# Patient Record
Sex: Female | Born: 1980 | Race: Black or African American | Hispanic: No | Marital: Married | Smoking: Former smoker
Health system: Southern US, Community
[De-identification: ages and names within clinical notes are randomized; demographics above are authoritative.]

## PROBLEM LIST (undated history)

## (undated) ENCOUNTER — Inpatient Hospital Stay (HOSPITAL_COMMUNITY): Payer: Self-pay

## (undated) DIAGNOSIS — R51 Headache: Secondary | ICD-10-CM

## (undated) DIAGNOSIS — Z9851 Tubal ligation status: Secondary | ICD-10-CM

## (undated) DIAGNOSIS — C801 Malignant (primary) neoplasm, unspecified: Secondary | ICD-10-CM

## (undated) DIAGNOSIS — Z8759 Personal history of other complications of pregnancy, childbirth and the puerperium: Secondary | ICD-10-CM

## (undated) DIAGNOSIS — B999 Unspecified infectious disease: Secondary | ICD-10-CM

## (undated) DIAGNOSIS — B009 Herpesviral infection, unspecified: Secondary | ICD-10-CM

## (undated) DIAGNOSIS — R87619 Unspecified abnormal cytological findings in specimens from cervix uteri: Secondary | ICD-10-CM

## (undated) DIAGNOSIS — J45909 Unspecified asthma, uncomplicated: Secondary | ICD-10-CM

## (undated) HISTORY — DX: Tubal ligation status: Z98.51

## (undated) HISTORY — PX: DILATION AND CURETTAGE OF UTERUS: SHX78

## (undated) HISTORY — DX: Unspecified abnormal cytological findings in specimens from cervix uteri: R87.619

## (undated) HISTORY — PX: DILATATION & CURETTAGE/HYSTEROSCOPY WITH TRUECLEAR: SHX6353

---

## 2004-10-01 ENCOUNTER — Emergency Department (HOSPITAL_COMMUNITY): Admission: EM | Admit: 2004-10-01 | Discharge: 2004-10-01 | Payer: Self-pay | Admitting: Emergency Medicine

## 2004-10-31 ENCOUNTER — Emergency Department (HOSPITAL_COMMUNITY): Admission: EM | Admit: 2004-10-31 | Discharge: 2004-10-31 | Payer: Self-pay | Admitting: Emergency Medicine

## 2004-12-17 ENCOUNTER — Emergency Department (HOSPITAL_COMMUNITY): Admission: EM | Admit: 2004-12-17 | Discharge: 2004-12-17 | Payer: Self-pay | Admitting: Emergency Medicine

## 2005-01-09 ENCOUNTER — Emergency Department (HOSPITAL_COMMUNITY): Admission: EM | Admit: 2005-01-09 | Discharge: 2005-01-09 | Payer: Self-pay | Admitting: Physician Assistant

## 2005-05-08 ENCOUNTER — Emergency Department (HOSPITAL_COMMUNITY): Admission: EM | Admit: 2005-05-08 | Discharge: 2005-05-08 | Payer: Self-pay | Admitting: Emergency Medicine

## 2005-06-03 ENCOUNTER — Ambulatory Visit: Payer: Self-pay | Admitting: Obstetrics and Gynecology

## 2005-06-03 ENCOUNTER — Inpatient Hospital Stay (HOSPITAL_COMMUNITY): Admission: AD | Admit: 2005-06-03 | Discharge: 2005-06-04 | Payer: Self-pay | Admitting: *Deleted

## 2005-06-11 ENCOUNTER — Ambulatory Visit (HOSPITAL_COMMUNITY): Admission: RE | Admit: 2005-06-11 | Discharge: 2005-06-11 | Payer: Self-pay | Admitting: *Deleted

## 2005-07-10 ENCOUNTER — Ambulatory Visit: Payer: Self-pay | Admitting: Family Medicine

## 2005-07-31 ENCOUNTER — Ambulatory Visit: Payer: Self-pay | Admitting: Family Medicine

## 2005-08-17 ENCOUNTER — Inpatient Hospital Stay (HOSPITAL_COMMUNITY): Admission: AD | Admit: 2005-08-17 | Discharge: 2005-08-17 | Payer: Self-pay | Admitting: *Deleted

## 2005-08-17 ENCOUNTER — Ambulatory Visit: Payer: Self-pay | Admitting: Family Medicine

## 2005-09-01 HISTORY — PX: GYNECOLOGIC CRYOSURGERY: SHX857

## 2005-09-15 ENCOUNTER — Observation Stay (HOSPITAL_COMMUNITY): Admission: AD | Admit: 2005-09-15 | Discharge: 2005-09-16 | Payer: Self-pay | Admitting: Obstetrics & Gynecology

## 2005-09-15 ENCOUNTER — Ambulatory Visit: Payer: Self-pay | Admitting: Obstetrics & Gynecology

## 2005-09-18 ENCOUNTER — Ambulatory Visit: Payer: Self-pay | Admitting: Family Medicine

## 2005-09-25 ENCOUNTER — Ambulatory Visit: Payer: Self-pay | Admitting: Family Medicine

## 2005-10-01 ENCOUNTER — Ambulatory Visit: Payer: Self-pay | Admitting: Obstetrics & Gynecology

## 2005-10-08 ENCOUNTER — Ambulatory Visit: Payer: Self-pay | Admitting: Certified Nurse Midwife

## 2005-10-08 ENCOUNTER — Inpatient Hospital Stay (HOSPITAL_COMMUNITY): Admission: AD | Admit: 2005-10-08 | Discharge: 2005-10-11 | Payer: Self-pay | Admitting: Obstetrics and Gynecology

## 2006-02-18 ENCOUNTER — Ambulatory Visit: Payer: Self-pay | Admitting: Obstetrics & Gynecology

## 2006-06-01 ENCOUNTER — Emergency Department (HOSPITAL_COMMUNITY): Admission: EM | Admit: 2006-06-01 | Discharge: 2006-06-02 | Payer: Self-pay | Admitting: Emergency Medicine

## 2006-06-29 ENCOUNTER — Inpatient Hospital Stay (HOSPITAL_COMMUNITY): Admission: AD | Admit: 2006-06-29 | Discharge: 2006-06-29 | Payer: Self-pay | Admitting: Gynecology

## 2006-08-15 ENCOUNTER — Emergency Department (HOSPITAL_COMMUNITY): Admission: EM | Admit: 2006-08-15 | Discharge: 2006-08-16 | Payer: Self-pay | Admitting: Emergency Medicine

## 2006-09-20 ENCOUNTER — Inpatient Hospital Stay (HOSPITAL_COMMUNITY): Admission: AD | Admit: 2006-09-20 | Discharge: 2006-09-20 | Payer: Self-pay | Admitting: Obstetrics & Gynecology

## 2006-09-22 ENCOUNTER — Ambulatory Visit: Payer: Self-pay | Admitting: *Deleted

## 2006-09-22 ENCOUNTER — Inpatient Hospital Stay (HOSPITAL_COMMUNITY): Admission: AD | Admit: 2006-09-22 | Discharge: 2006-09-23 | Payer: Self-pay | Admitting: Family Medicine

## 2006-09-24 ENCOUNTER — Ambulatory Visit (HOSPITAL_COMMUNITY): Admission: RE | Admit: 2006-09-24 | Discharge: 2006-09-24 | Payer: Self-pay | Admitting: Family Medicine

## 2006-09-30 ENCOUNTER — Ambulatory Visit: Payer: Self-pay | Admitting: *Deleted

## 2006-10-07 ENCOUNTER — Ambulatory Visit: Payer: Self-pay | Admitting: Obstetrics and Gynecology

## 2006-10-14 ENCOUNTER — Ambulatory Visit: Payer: Self-pay | Admitting: Obstetrics & Gynecology

## 2006-10-21 ENCOUNTER — Ambulatory Visit: Payer: Self-pay | Admitting: *Deleted

## 2006-10-21 ENCOUNTER — Encounter (INDEPENDENT_AMBULATORY_CARE_PROVIDER_SITE_OTHER): Payer: Self-pay | Admitting: *Deleted

## 2006-10-21 ENCOUNTER — Other Ambulatory Visit: Admission: RE | Admit: 2006-10-21 | Discharge: 2006-10-21 | Payer: Self-pay | Admitting: *Deleted

## 2006-11-04 ENCOUNTER — Ambulatory Visit: Payer: Self-pay | Admitting: Obstetrics & Gynecology

## 2006-11-11 ENCOUNTER — Ambulatory Visit: Payer: Self-pay | Admitting: Obstetrics & Gynecology

## 2006-11-18 ENCOUNTER — Ambulatory Visit: Payer: Self-pay | Admitting: *Deleted

## 2006-11-25 ENCOUNTER — Ambulatory Visit: Payer: Self-pay | Admitting: Obstetrics and Gynecology

## 2006-11-30 ENCOUNTER — Inpatient Hospital Stay (HOSPITAL_COMMUNITY): Admission: AD | Admit: 2006-11-30 | Discharge: 2006-11-30 | Payer: Self-pay | Admitting: Obstetrics & Gynecology

## 2006-11-30 ENCOUNTER — Ambulatory Visit: Payer: Self-pay | Admitting: *Deleted

## 2006-12-02 ENCOUNTER — Ambulatory Visit: Payer: Self-pay | Admitting: Obstetrics & Gynecology

## 2006-12-05 ENCOUNTER — Ambulatory Visit: Payer: Self-pay | Admitting: Obstetrics and Gynecology

## 2006-12-05 ENCOUNTER — Inpatient Hospital Stay (HOSPITAL_COMMUNITY): Admission: AD | Admit: 2006-12-05 | Discharge: 2006-12-05 | Payer: Self-pay | Admitting: Obstetrics and Gynecology

## 2006-12-09 ENCOUNTER — Ambulatory Visit: Payer: Self-pay | Admitting: Obstetrics & Gynecology

## 2006-12-16 ENCOUNTER — Ambulatory Visit: Payer: Self-pay | Admitting: Obstetrics & Gynecology

## 2006-12-23 ENCOUNTER — Ambulatory Visit: Payer: Self-pay | Admitting: *Deleted

## 2006-12-24 ENCOUNTER — Ambulatory Visit (HOSPITAL_COMMUNITY): Admission: RE | Admit: 2006-12-24 | Discharge: 2006-12-24 | Payer: Self-pay | Admitting: Obstetrics & Gynecology

## 2006-12-26 ENCOUNTER — Ambulatory Visit: Payer: Self-pay | Admitting: *Deleted

## 2006-12-26 ENCOUNTER — Inpatient Hospital Stay (HOSPITAL_COMMUNITY): Admission: AD | Admit: 2006-12-26 | Discharge: 2006-12-26 | Payer: Self-pay | Admitting: Family Medicine

## 2006-12-30 ENCOUNTER — Ambulatory Visit: Payer: Self-pay | Admitting: Obstetrics & Gynecology

## 2007-01-06 ENCOUNTER — Ambulatory Visit: Payer: Self-pay | Admitting: Obstetrics & Gynecology

## 2007-01-13 ENCOUNTER — Ambulatory Visit: Payer: Self-pay | Admitting: Obstetrics & Gynecology

## 2007-01-15 ENCOUNTER — Ambulatory Visit: Payer: Self-pay | Admitting: *Deleted

## 2007-01-15 ENCOUNTER — Inpatient Hospital Stay (HOSPITAL_COMMUNITY): Admission: AD | Admit: 2007-01-15 | Discharge: 2007-01-15 | Payer: Self-pay | Admitting: Obstetrics & Gynecology

## 2007-01-20 ENCOUNTER — Ambulatory Visit: Payer: Self-pay | Admitting: *Deleted

## 2007-01-27 ENCOUNTER — Ambulatory Visit: Payer: Self-pay | Admitting: Obstetrics & Gynecology

## 2007-01-29 ENCOUNTER — Ambulatory Visit (HOSPITAL_COMMUNITY): Admission: RE | Admit: 2007-01-29 | Discharge: 2007-01-29 | Payer: Self-pay | Admitting: Obstetrics and Gynecology

## 2007-02-01 ENCOUNTER — Ambulatory Visit: Payer: Self-pay | Admitting: *Deleted

## 2007-02-01 ENCOUNTER — Inpatient Hospital Stay (HOSPITAL_COMMUNITY): Admission: RE | Admit: 2007-02-01 | Discharge: 2007-02-03 | Payer: Self-pay | Admitting: Gynecology

## 2007-04-19 ENCOUNTER — Emergency Department (HOSPITAL_COMMUNITY): Admission: EM | Admit: 2007-04-19 | Discharge: 2007-04-19 | Payer: Self-pay | Admitting: Emergency Medicine

## 2007-06-05 ENCOUNTER — Inpatient Hospital Stay (HOSPITAL_COMMUNITY): Admission: AD | Admit: 2007-06-05 | Discharge: 2007-06-05 | Payer: Self-pay | Admitting: Obstetrics & Gynecology

## 2007-06-29 ENCOUNTER — Inpatient Hospital Stay (HOSPITAL_COMMUNITY): Admission: AD | Admit: 2007-06-29 | Discharge: 2007-06-29 | Payer: Self-pay | Admitting: Obstetrics & Gynecology

## 2007-07-12 ENCOUNTER — Inpatient Hospital Stay (HOSPITAL_COMMUNITY): Admission: AD | Admit: 2007-07-12 | Discharge: 2007-07-12 | Payer: Self-pay | Admitting: Obstetrics and Gynecology

## 2007-07-29 ENCOUNTER — Emergency Department (HOSPITAL_COMMUNITY): Admission: EM | Admit: 2007-07-29 | Discharge: 2007-07-30 | Payer: Self-pay | Admitting: Emergency Medicine

## 2007-09-10 ENCOUNTER — Inpatient Hospital Stay (HOSPITAL_COMMUNITY): Admission: AD | Admit: 2007-09-10 | Discharge: 2007-09-10 | Payer: Self-pay | Admitting: Gynecology

## 2007-11-11 ENCOUNTER — Inpatient Hospital Stay (HOSPITAL_COMMUNITY): Admission: AD | Admit: 2007-11-11 | Discharge: 2007-11-11 | Payer: Self-pay | Admitting: Obstetrics & Gynecology

## 2007-11-19 ENCOUNTER — Emergency Department (HOSPITAL_COMMUNITY): Admission: EM | Admit: 2007-11-19 | Discharge: 2007-11-19 | Payer: Self-pay | Admitting: Family Medicine

## 2007-12-02 ENCOUNTER — Emergency Department (HOSPITAL_COMMUNITY): Admission: EM | Admit: 2007-12-02 | Discharge: 2007-12-02 | Payer: Self-pay | Admitting: Family Medicine

## 2007-12-31 ENCOUNTER — Emergency Department (HOSPITAL_COMMUNITY): Admission: EM | Admit: 2007-12-31 | Discharge: 2008-01-01 | Payer: Self-pay | Admitting: Emergency Medicine

## 2008-03-09 ENCOUNTER — Emergency Department (HOSPITAL_COMMUNITY): Admission: EM | Admit: 2008-03-09 | Discharge: 2008-03-10 | Payer: Self-pay | Admitting: Emergency Medicine

## 2008-05-25 IMAGING — US US TRANSVAGINAL NON-OB
1 series · 14 of 25 positions shown · non-contrast
Comparison: 11/11/2007

CLINICAL DATA: Pelvic pain

ULTRASOUND PELVIS COMPLETE - MODIFY,TRANSVAGINAL ULTRASOUND OF
PELVIS

[Series 1: unknown · 0.32mm/px · 14 of 47 slices shown]
[im 1/47]
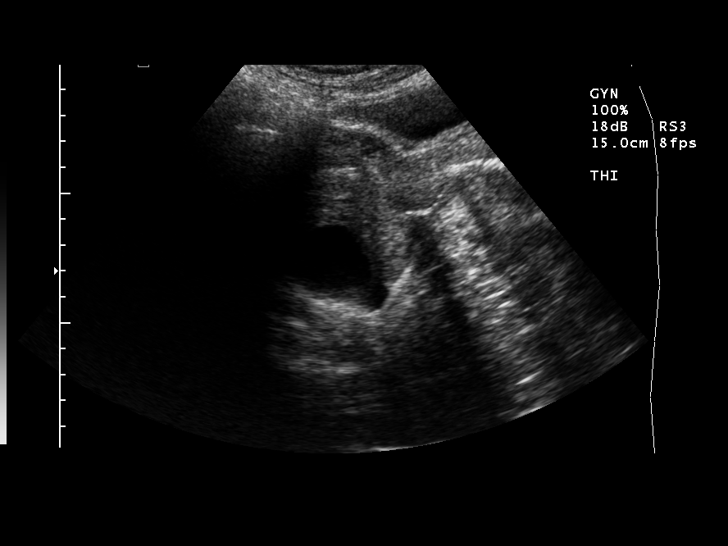
[im 4/47]
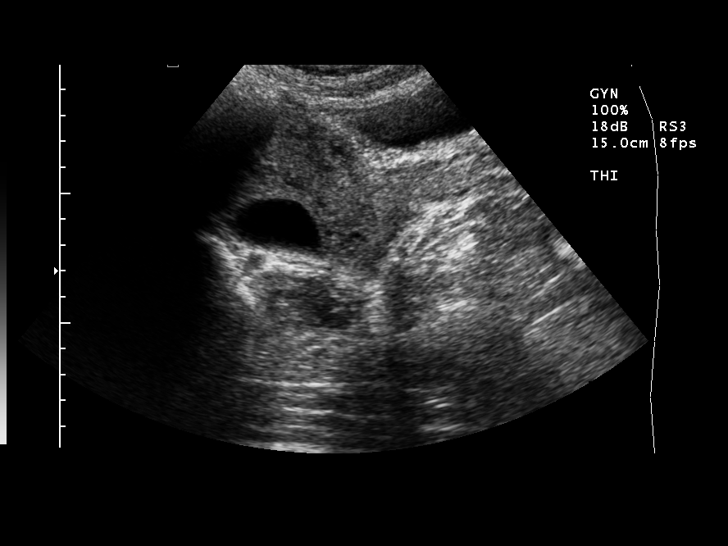
[im 8/47]
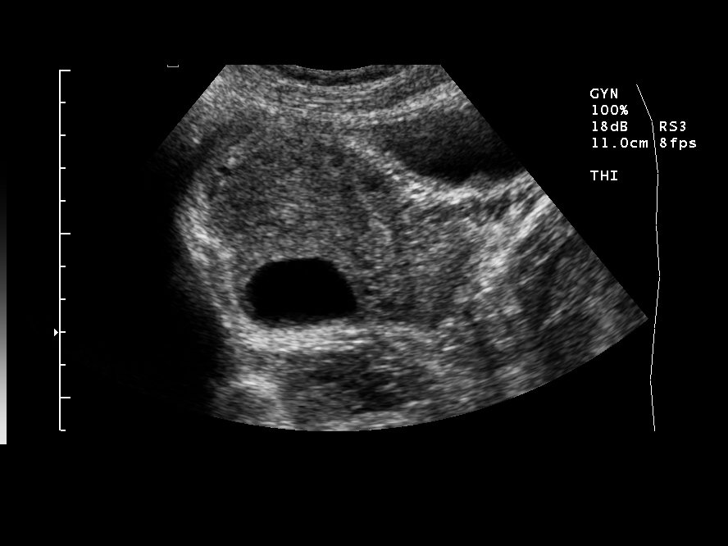
[im 12/47]
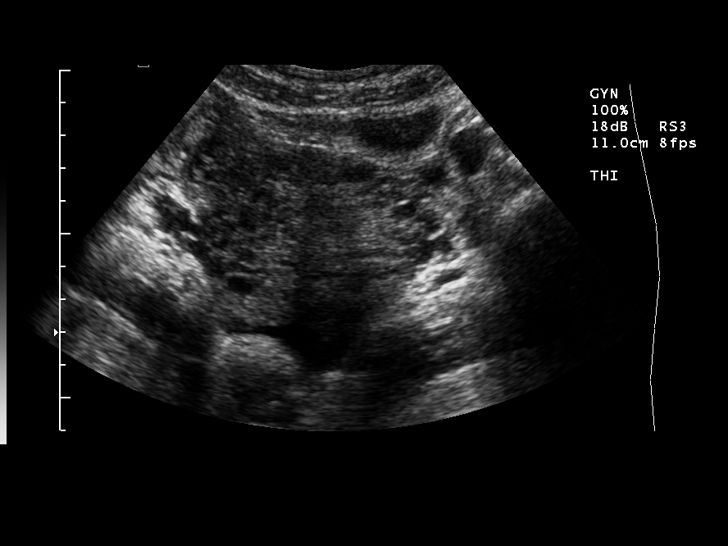
[im 16/47]
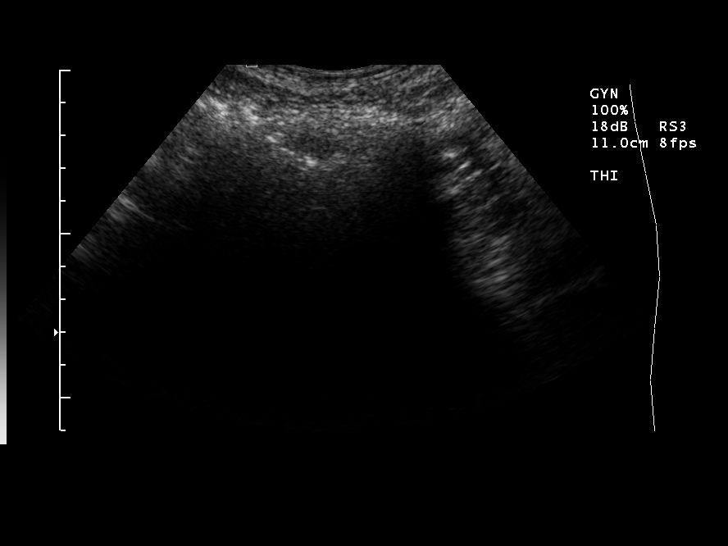
[im 18/47]
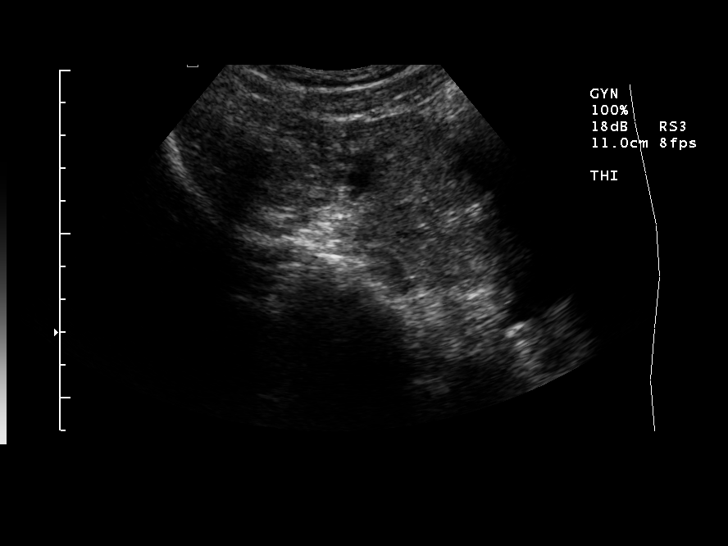
[im 22/47]
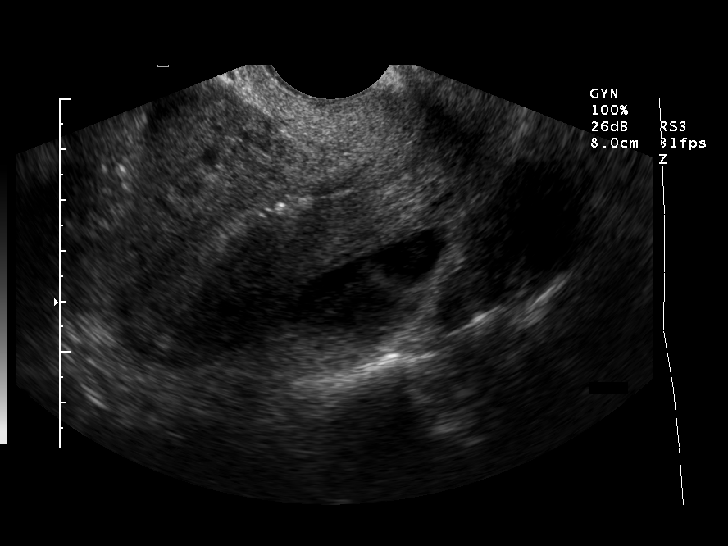
[im 25/47]
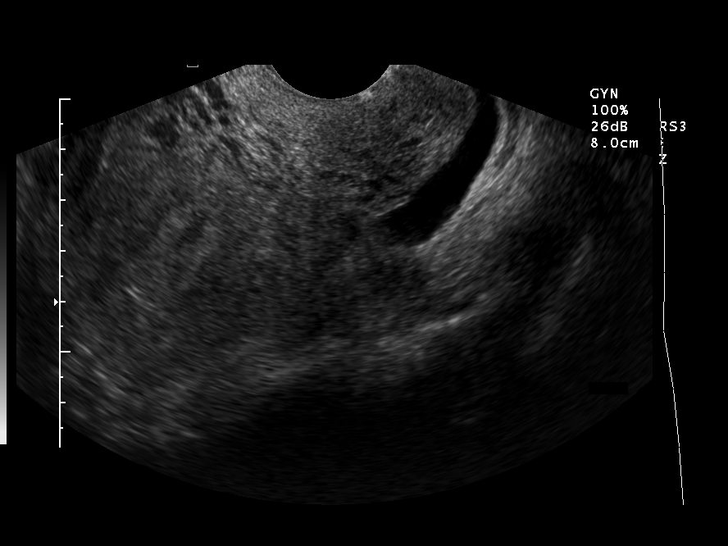
[im 29/47]
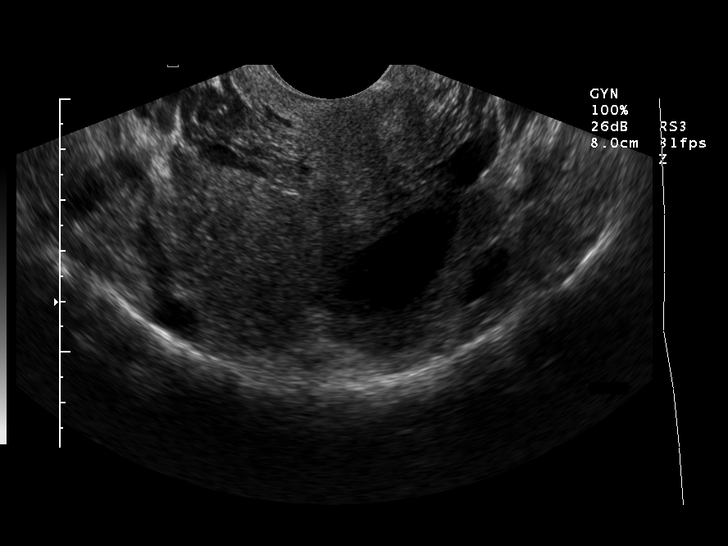
[im 31/47]
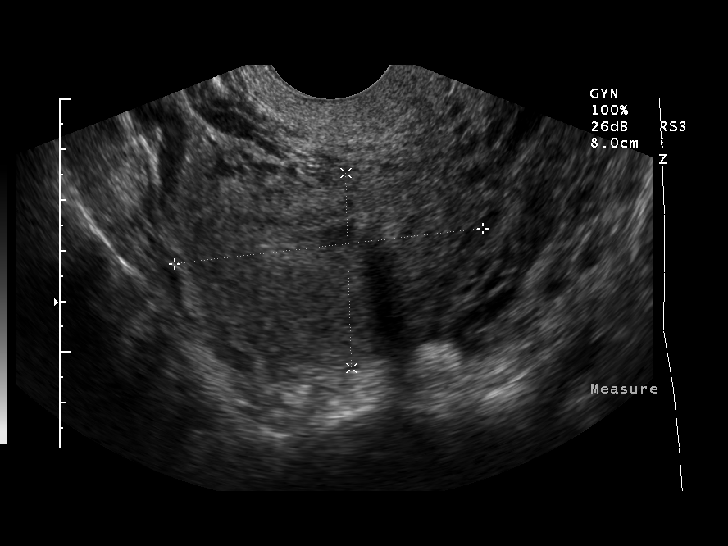
[im 35/47]
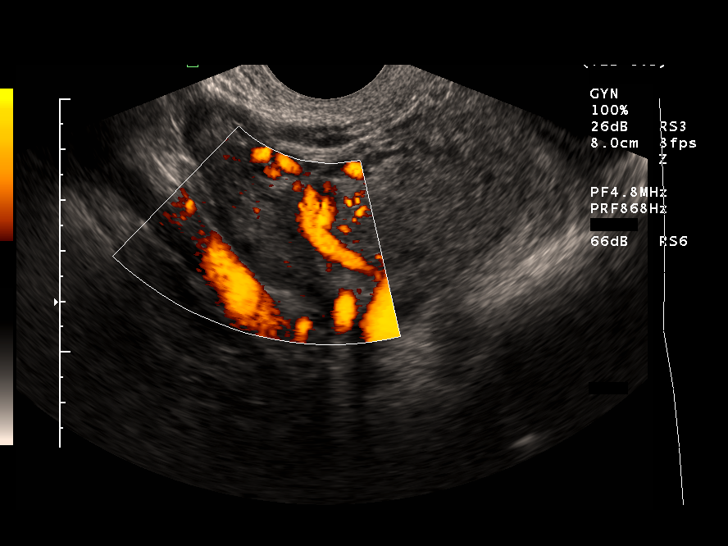
[im 39/47]
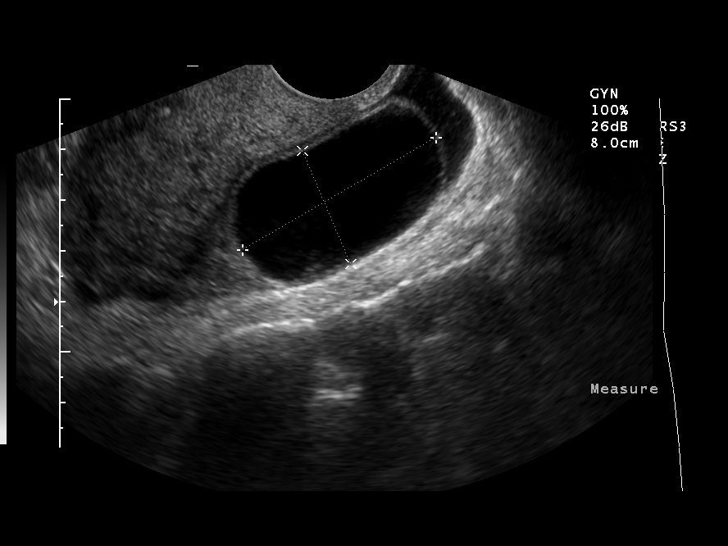
[im 43/47]
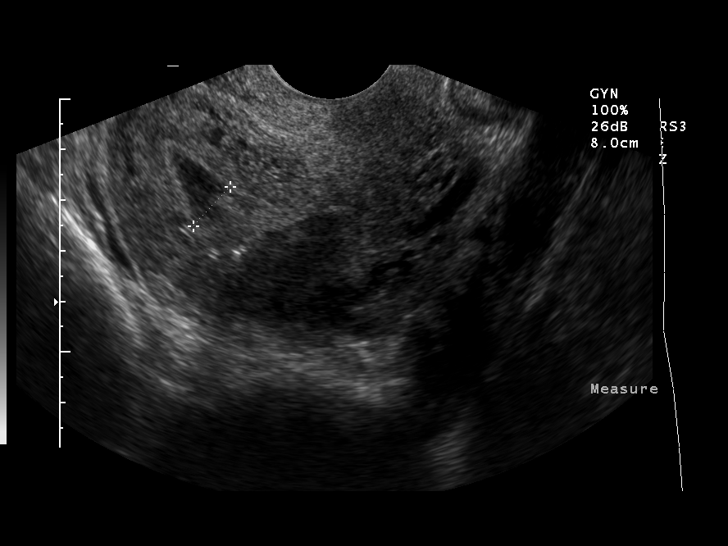
[im 47/47]
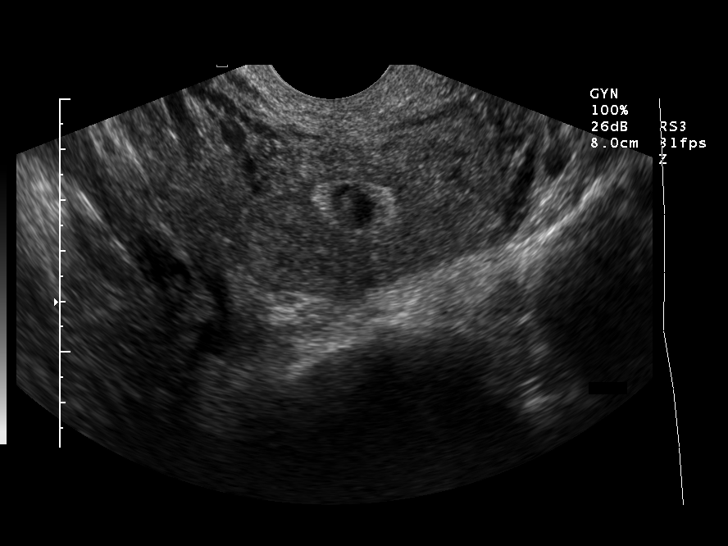

[14 of 25 positions shown; findings below may reference images not displayed]

FINDINGS: The uterus is a 0.8 x 3.8 x 6.1 cm.  The endometrial
stripe is 11 mm in thickness.  An IUD device is present within the
endometrial canal.  A small amount of fluid is present in the
endometrial canal. Irregularity of the endometrium is also noted
and underlying endometrial pathology is not excluded.

The right ovary is within normal limits.  There is a 4.4 x 2.4 x
3.0 cyst within the left ovary.  Doppler examination does
demonstrate flow in both ovaries.
IMPRESSION: The endometrium is abnormal.  Endometrial pathology such as polyp
or hyperplasia is not excluded.  Sonohysterogram may be helpful.

IUD device is positioned in the endometrium

The large right ovarian cyst previously noted has resolved.  Now,
there is a 4.4 cm left ovarian cyst.  Follow-up ultrasound in 6
weeks is recommended to ensure resolution.

## 2008-09-19 ENCOUNTER — Emergency Department (HOSPITAL_COMMUNITY): Admission: EM | Admit: 2008-09-19 | Discharge: 2008-09-19 | Payer: Self-pay | Admitting: Family Medicine

## 2008-11-14 ENCOUNTER — Emergency Department (HOSPITAL_COMMUNITY): Admission: EM | Admit: 2008-11-14 | Discharge: 2008-11-14 | Payer: Self-pay | Admitting: Family Medicine

## 2008-12-20 ENCOUNTER — Inpatient Hospital Stay (HOSPITAL_COMMUNITY): Admission: AD | Admit: 2008-12-20 | Discharge: 2008-12-20 | Payer: Self-pay | Admitting: Family Medicine

## 2009-03-25 ENCOUNTER — Emergency Department (HOSPITAL_COMMUNITY): Admission: EM | Admit: 2009-03-25 | Discharge: 2009-03-25 | Payer: Self-pay | Admitting: Emergency Medicine

## 2009-03-27 ENCOUNTER — Inpatient Hospital Stay (HOSPITAL_COMMUNITY): Admission: AD | Admit: 2009-03-27 | Discharge: 2009-03-27 | Payer: Self-pay | Admitting: Obstetrics & Gynecology

## 2009-04-04 ENCOUNTER — Inpatient Hospital Stay (HOSPITAL_COMMUNITY): Admission: RE | Admit: 2009-04-04 | Discharge: 2009-04-04 | Payer: Self-pay | Admitting: Obstetrics & Gynecology

## 2009-07-22 ENCOUNTER — Inpatient Hospital Stay (HOSPITAL_COMMUNITY): Admission: AD | Admit: 2009-07-22 | Discharge: 2009-07-22 | Payer: Self-pay | Admitting: Obstetrics and Gynecology

## 2009-08-10 ENCOUNTER — Ambulatory Visit: Payer: Self-pay | Admitting: Family Medicine

## 2009-08-10 ENCOUNTER — Inpatient Hospital Stay (HOSPITAL_COMMUNITY): Admission: AD | Admit: 2009-08-10 | Discharge: 2009-08-10 | Payer: Self-pay | Admitting: Obstetrics & Gynecology

## 2009-10-17 ENCOUNTER — Inpatient Hospital Stay (HOSPITAL_COMMUNITY): Admission: AD | Admit: 2009-10-17 | Discharge: 2009-10-17 | Payer: Self-pay | Admitting: Obstetrics & Gynecology

## 2009-10-18 ENCOUNTER — Inpatient Hospital Stay (HOSPITAL_COMMUNITY): Admission: AD | Admit: 2009-10-18 | Discharge: 2009-10-18 | Payer: Self-pay | Admitting: Obstetrics & Gynecology

## 2009-10-24 ENCOUNTER — Encounter: Payer: Self-pay | Admitting: Advanced Practice Midwife

## 2009-10-24 ENCOUNTER — Ambulatory Visit: Payer: Self-pay | Admitting: Obstetrics and Gynecology

## 2009-10-31 ENCOUNTER — Ambulatory Visit (HOSPITAL_COMMUNITY): Admission: RE | Admit: 2009-10-31 | Discharge: 2009-10-31 | Payer: Self-pay | Admitting: Obstetrics and Gynecology

## 2009-10-31 ENCOUNTER — Ambulatory Visit: Payer: Self-pay | Admitting: Obstetrics and Gynecology

## 2009-11-01 ENCOUNTER — Inpatient Hospital Stay (HOSPITAL_COMMUNITY): Admission: AD | Admit: 2009-11-01 | Discharge: 2009-11-02 | Payer: Self-pay | Admitting: Obstetrics & Gynecology

## 2009-11-01 ENCOUNTER — Ambulatory Visit: Payer: Self-pay | Admitting: Advanced Practice Midwife

## 2009-11-07 ENCOUNTER — Ambulatory Visit: Payer: Self-pay | Admitting: Obstetrics & Gynecology

## 2009-11-10 ENCOUNTER — Ambulatory Visit: Payer: Self-pay | Admitting: Advanced Practice Midwife

## 2009-11-10 ENCOUNTER — Inpatient Hospital Stay (HOSPITAL_COMMUNITY): Admission: AD | Admit: 2009-11-10 | Discharge: 2009-11-10 | Payer: Self-pay | Admitting: Obstetrics & Gynecology

## 2009-11-14 ENCOUNTER — Ambulatory Visit: Payer: Self-pay | Admitting: Obstetrics and Gynecology

## 2009-11-19 ENCOUNTER — Ambulatory Visit: Payer: Self-pay | Admitting: Obstetrics & Gynecology

## 2009-11-21 ENCOUNTER — Ambulatory Visit: Payer: Self-pay | Admitting: Obstetrics and Gynecology

## 2009-11-23 ENCOUNTER — Inpatient Hospital Stay (HOSPITAL_COMMUNITY): Admission: AD | Admit: 2009-11-23 | Discharge: 2009-11-26 | Payer: Self-pay | Admitting: Obstetrics and Gynecology

## 2009-11-23 ENCOUNTER — Ambulatory Visit: Payer: Self-pay | Admitting: Advanced Practice Midwife

## 2010-04-03 ENCOUNTER — Inpatient Hospital Stay (HOSPITAL_COMMUNITY): Admission: AD | Admit: 2010-04-03 | Discharge: 2010-04-03 | Payer: Self-pay | Admitting: Obstetrics and Gynecology

## 2010-04-03 ENCOUNTER — Ambulatory Visit: Payer: Self-pay | Admitting: Obstetrics and Gynecology

## 2010-11-15 LAB — URINALYSIS, ROUTINE W REFLEX MICROSCOPIC
Bilirubin Urine: NEGATIVE
Ketones, ur: NEGATIVE mg/dL
Nitrite: NEGATIVE
Protein, ur: NEGATIVE mg/dL
Urobilinogen, UA: 2 mg/dL — ABNORMAL HIGH (ref 0.0–1.0)
pH: 6 (ref 5.0–8.0)

## 2010-11-15 LAB — CBC
MCH: 32 pg (ref 26.0–34.0)
MCHC: 34.5 g/dL (ref 30.0–36.0)
MCV: 92.8 fL (ref 78.0–100.0)
Platelets: 184 10*3/uL (ref 150–400)
RBC: 4.09 MIL/uL (ref 3.87–5.11)
RDW: 14.2 % (ref 11.5–15.5)

## 2010-11-15 LAB — GC/CHLAMYDIA PROBE AMP, GENITAL: Chlamydia, DNA Probe: NEGATIVE

## 2010-11-15 LAB — WET PREP, GENITAL

## 2010-11-20 LAB — RAPID URINE DRUG SCREEN, HOSP PERFORMED
Barbiturates: NOT DETECTED
Benzodiazepines: NOT DETECTED
Cocaine: NOT DETECTED

## 2010-11-20 LAB — HEPATITIS B SURFACE ANTIGEN: Hepatitis B Surface Ag: NEGATIVE

## 2010-11-20 LAB — RUBELLA SCREEN: Rubella: 275.5 IU/mL — ABNORMAL HIGH

## 2010-11-20 LAB — URINE MICROSCOPIC-ADD ON

## 2010-11-20 LAB — URINALYSIS, ROUTINE W REFLEX MICROSCOPIC
Glucose, UA: NEGATIVE mg/dL
Ketones, ur: NEGATIVE mg/dL
Leukocytes, UA: NEGATIVE
pH: 7 (ref 5.0–8.0)

## 2010-11-20 LAB — DIFFERENTIAL
Basophils Absolute: 0 10*3/uL (ref 0.0–0.1)
Lymphocytes Relative: 20 % (ref 12–46)
Lymphs Abs: 1.8 10*3/uL (ref 0.7–4.0)
Neutro Abs: 6.5 10*3/uL (ref 1.7–7.7)

## 2010-11-20 LAB — STREP B DNA PROBE: Strep Group B Ag: NEGATIVE

## 2010-11-20 LAB — CBC
Hemoglobin: 8.8 g/dL — ABNORMAL LOW (ref 12.0–15.0)
Platelets: 227 10*3/uL (ref 150–400)
RDW: 13.6 % (ref 11.5–15.5)
WBC: 9.2 10*3/uL (ref 4.0–10.5)

## 2010-11-20 LAB — GC/CHLAMYDIA PROBE AMP, GENITAL
Chlamydia, DNA Probe: NEGATIVE
GC Probe Amp, Genital: NEGATIVE

## 2010-11-20 LAB — POCT URINALYSIS DIP (DEVICE)
Bilirubin Urine: NEGATIVE
Glucose, UA: NEGATIVE mg/dL
Ketones, ur: NEGATIVE mg/dL
Protein, ur: NEGATIVE mg/dL

## 2010-11-20 LAB — URINE CULTURE

## 2010-11-20 LAB — WET PREP, GENITAL: Yeast Wet Prep HPF POC: NONE SEEN

## 2010-11-20 LAB — RPR: RPR Ser Ql: NONREACTIVE

## 2010-11-24 LAB — WET PREP, GENITAL: Trich, Wet Prep: NONE SEEN

## 2010-11-24 LAB — POCT URINALYSIS DIP (DEVICE)
Glucose, UA: NEGATIVE mg/dL
Nitrite: NEGATIVE
Protein, ur: 30 mg/dL — AB
Specific Gravity, Urine: 1.015 (ref 1.005–1.030)
Urobilinogen, UA: 1 mg/dL (ref 0.0–1.0)
Urobilinogen, UA: 4 mg/dL — ABNORMAL HIGH (ref 0.0–1.0)
pH: 7 (ref 5.0–8.0)

## 2010-11-24 LAB — CBC
HCT: 26.3 % — ABNORMAL LOW (ref 36.0–46.0)
HCT: 28.2 % — ABNORMAL LOW (ref 36.0–46.0)
Hemoglobin: 8.7 g/dL — ABNORMAL LOW (ref 12.0–15.0)
Hemoglobin: 9.6 g/dL — ABNORMAL LOW (ref 12.0–15.0)
MCV: 87.1 fL (ref 78.0–100.0)
RBC: 3.24 MIL/uL — ABNORMAL LOW (ref 3.87–5.11)
RDW: 16.6 % — ABNORMAL HIGH (ref 11.5–15.5)
WBC: 8 10*3/uL (ref 4.0–10.5)

## 2010-11-24 LAB — RPR: RPR Ser Ql: NONREACTIVE

## 2010-12-03 LAB — RAPID URINE DRUG SCREEN, HOSP PERFORMED
Amphetamines: NOT DETECTED
Benzodiazepines: NOT DETECTED
Cocaine: NOT DETECTED
Opiates: NOT DETECTED
Tetrahydrocannabinol: NOT DETECTED

## 2010-12-03 LAB — WET PREP, GENITAL

## 2010-12-03 LAB — URINALYSIS, ROUTINE W REFLEX MICROSCOPIC
Glucose, UA: NEGATIVE mg/dL
Protein, ur: NEGATIVE mg/dL

## 2010-12-03 LAB — URINE MICROSCOPIC-ADD ON

## 2010-12-04 LAB — URINE MICROSCOPIC-ADD ON

## 2010-12-04 LAB — URINALYSIS, ROUTINE W REFLEX MICROSCOPIC
Glucose, UA: NEGATIVE mg/dL
Leukocytes, UA: NEGATIVE
Specific Gravity, Urine: 1.025 (ref 1.005–1.030)
pH: 7 (ref 5.0–8.0)

## 2010-12-04 LAB — CBC
HCT: 31.2 % — ABNORMAL LOW (ref 36.0–46.0)
Platelets: 166 10*3/uL (ref 150–400)
RDW: 13.3 % (ref 11.5–15.5)

## 2010-12-08 LAB — WET PREP, GENITAL: Trich, Wet Prep: NONE SEEN

## 2010-12-08 LAB — URINALYSIS, ROUTINE W REFLEX MICROSCOPIC
Hgb urine dipstick: NEGATIVE
Ketones, ur: NEGATIVE mg/dL
Protein, ur: NEGATIVE mg/dL
Urobilinogen, UA: 0.2 mg/dL (ref 0.0–1.0)

## 2010-12-08 LAB — HCG, QUANTITATIVE, PREGNANCY
hCG, Beta Chain, Quant, S: 19920 m[IU]/mL — ABNORMAL HIGH (ref <5)
hCG, Beta Chain, Quant, S: 9725 m[IU]/mL — ABNORMAL HIGH (ref <5)

## 2010-12-08 LAB — ABO/RH: ABO/RH(D): A POS

## 2010-12-11 LAB — URINALYSIS, ROUTINE W REFLEX MICROSCOPIC
Bilirubin Urine: NEGATIVE
Ketones, ur: NEGATIVE mg/dL
Nitrite: NEGATIVE
Protein, ur: NEGATIVE mg/dL
Specific Gravity, Urine: 1.02 (ref 1.005–1.030)
Urobilinogen, UA: 0.2 mg/dL (ref 0.0–1.0)

## 2010-12-11 LAB — CBC
MCHC: 34.2 g/dL (ref 30.0–36.0)
Platelets: 194 10*3/uL (ref 150–400)
RDW: 13.1 % (ref 11.5–15.5)

## 2010-12-11 LAB — URINE MICROSCOPIC-ADD ON

## 2010-12-11 LAB — WET PREP, GENITAL
Clue Cells Wet Prep HPF POC: NONE SEEN
Trich, Wet Prep: NONE SEEN
Yeast Wet Prep HPF POC: NONE SEEN

## 2010-12-11 LAB — GC/CHLAMYDIA PROBE AMP, GENITAL
Chlamydia, DNA Probe: NEGATIVE
GC Probe Amp, Genital: NEGATIVE

## 2010-12-12 LAB — POCT URINALYSIS DIP (DEVICE)
Bilirubin Urine: NEGATIVE
Glucose, UA: NEGATIVE mg/dL
Nitrite: NEGATIVE
Urobilinogen, UA: 0.2 mg/dL (ref 0.0–1.0)

## 2010-12-12 LAB — GC/CHLAMYDIA PROBE AMP, GENITAL: Chlamydia, DNA Probe: NEGATIVE

## 2010-12-12 LAB — WET PREP, GENITAL: Yeast Wet Prep HPF POC: NONE SEEN

## 2010-12-12 LAB — POCT PREGNANCY, URINE: Preg Test, Ur: NEGATIVE

## 2010-12-16 LAB — POCT URINALYSIS DIP (DEVICE)
Ketones, ur: NEGATIVE mg/dL
Nitrite: NEGATIVE
pH: 6 (ref 5.0–8.0)

## 2010-12-16 LAB — WET PREP, GENITAL
Trich, Wet Prep: NONE SEEN
Yeast Wet Prep HPF POC: NONE SEEN

## 2011-01-14 NOTE — Consult Note (Signed)
NAME:  Shelly Silva, Shelly Silva             ACCOUNT NO.:  [id number]   MEDICAL RECORD NO.:  [id number]          PATIENT TYPE:  EMS   LOCATION:  [facility]                         FACILITY:  [facility]   PHYSICIAN:  M. Leda Quail, MD  DATE OF BIRTH:  27-Feb-1981   DATE OF CONSULTATION:  DATE OF DISCHARGE:  03/25/2009                                 CONSULTATION   CHIEF COMPLAINT:  Abdominal pain.   HISTORY OF PRESENT ILLNESS:  This is a 30 year old G6-P4-A1 single  [place] female who presents with acute onset of right lower  quadrant pain late last night, which worsened and caused the patient to  come into the emergency room.  She did take some Motrin before coming in  which did not resolve the pain.  She has had some accompanying nausea  because of the pain.  She has issues with constipation and bowel  movements about every 3 days, but this is not new for her.  Last bowel  movement was about 2 days ago.  She had no dysuria, frequency, urgency  or hesitancy.  No flank or back pain.  The patient's LMP was February 08, 2009.  She did not think that she was pregnant.  She has had no breast  tenderness, which she frequently has with pregnancies, but not always.  She also has had no recent headache, palpitations, or shortness of  breath, lightheadedness.  She does report some increasing vaginal  discharge over the last several days as well.   PAST MEDICAL HISTORY:  Significant for asthma.   PAST SURGICAL HISTORY:  Negative except for cryosurgery, which is  actually done in the office of the G[facility] Clinic at [facility].  This  was in 2007.   ALLERGIES:  No known drug allergies.   MEDICATIONS:  None.  She does have prescription for prenatal vitamins,  which she was given [facility]   SOCIAL HISTORY:  Negative x3.  She is accompanied by her significant  other today.   OB HISTORY:  Significant for 4 vaginal deliveries in 2002, 2004, 2007,  and  2008.  Two of these were in [place], two of these were at the  [facility].  In 2005, a D and C which was done at [facility] and  then her current pregnancy right now.  The patient has had a history of  Chlamydia in the past as well as abnormal Pap smears, which were treated  with cryotherapy.  Her last Pap was documented in the hospital system  was February 2008, which was negative.   PHYSICAL EXAMINATION:  VITAL SIGNS:  BP 116/65, respirations 20,  temperature 99.0, and pulse 68.  GENERAL:  She is a well-nourished, well-developed African American who  is in no acute distress.  NECK:  Thyroid is not enlarged, nontender.  Trachea is midline.  CARDIOVASCULAR:  Regular rate and rhythm without murmurs, rubs, or  gallops.  LUNGS:  Clear to auscultation bilaterally with good respiratory effort.  Flank, no CVA tenderness.  ABDOMEN:  Soft, nondistended, very mild right lower quadrant tenderness.  No  hernias, hepatosplenomegaly, or masses.  GYN:  Normal appearing external female genitalia.  Urethra, urethral  meatus, and bladder appeared normal and nontender to palpation.  There  is some yellowish, slightly malodorous discharge.  The discharge smells  slightly fishy.  She has paracervix.  She has smooth and mobile uterus,  which is normal in size.  She has no CVA tenderness.  She has some right  adnexal fullness, but no tenderness to palpation, and the left ovary  feels completely normal.  Perineum is without visible lesions.   LABORATORY DATA:  Blood type is A positive.  Quant today is 9700.  Wet  smear showed moderate clue cells and moderate white blood cells.  The  urinalysis was negative.   Ultrasound, I actually visualized the ultrasound myself and viewed  previous images over the last several years.  She has a normal-appearing  uterus with a positive gestational sac and positive yolk sac.  No fetal  pole.  No cardiac activity noted.  The gestational sac measures 10.7 mm   and consistent with 5 weeks 6/7 day gestation with an [facility] of November 19, 2009.  Her left ovary appears normal is 3.5 x 2.0 x 2.0 cm with normal  follicles.  The right ovary contained a 2.4 x 2.5 x 2.7 cm solid area  that has increased blood flow on Doppler evaluation.  There is some free  fluid around the right adnexa, but minimal free fluid in the posterior  cul-de-sac.  Comparison with ultrasound on December 20, 2008, shows a 6.0 x  2.9 x 4.5 cm right ovary with a 5.4 cm cyst.  This was clear with a few  small nodularities along with cyst wall.  I looked at these ultrasound  images myself, as well.  The patient has a history of IUD use, but no  IUD was noted on either of these ultrasounds in April or again today.   ASSESSMENT AND PLAN:  Confirmed intrauterine pregnancy was 3-cm right  adnexal mass, doubtful with heterotopic pregnancy.  1. With the patient's benign exam and previous ultrasound showing a 6-      cm cyst, I feel this most likely relate to a regressing collapsing      cyst although it does not have clear feature of that.  Diagnosis of      heterotopic pregnancy and risk for rupture were discussed with the      patient.  Because her exam is completely benign today, and she was      nontender with her ultrasound examination (I did discuss this with      the ultrasonographer).  I feel that the patient can be followed      conservatively.  She has been given precautions to return      particularly if her pain worsens or she has any vaginal bleeding.      The patient will be seen at [facility] on Tuesday at 9 a.m. for repeat      transvaginal ultrasound and quant.  Also, the on-call OB/GYN at      [facility] for unassigned cause has been contacted today Dr.      Despina Hidden and he is aware of this patient and situation if she were to      return with worsening abdominal pain.  2. The patient is advised to start prenatal vitamin.  3. The patient is advised not to take Motrin and use Tylenol  as      needed.  A  prescription for Vicodin 5/500 one to two tablets p.o.      q.4-6 h. p.r.n. pain #12 with no refills was given with the      patient.  Advised her to try and use this tediously as she is very      early in her pregnancy.  The patient voices understanding as does      her significant other who is in the room with her.  She will be      discharged to home in stable condition.      Lum Keas, MD  Electronically Signed     MSM/MEDQ  D:  03/25/2009  T:  03/25/2009  Job:  [id number]

## 2011-01-17 NOTE — Discharge Summary (Signed)
NAME:  Shelly Silva, Shelly Silva             ACCOUNT NO.:  [id number]   MEDICAL RECORD NO.:  [id number]           PATIENT TYPE:   LOCATION:                                 FACILITY:   PHYSICIAN:  Lesly Dukes, M.D. DATE OF BIRTH:  13-Jan-1981   DATE OF ADMISSION:  09/15/2005  DATE OF DISCHARGE:  09/16/2005                                 DISCHARGE SUMMARY   DISCHARGE DIAGNOSES:  1.  Rule out abruption.  38.  30 year old gravida 4, para 1-1-1-2 at [redacted] weeks gestation with painless      vaginal bleeding, not delivered.   DISCHARGE MEDICATIONS:  Prenatal vitamins.   BRIEF HOSPITAL COURSE:  Patient is a 30 year old gravida 4, para 1-1-1-2 who  presented to the [facility] at 27 and 6 weeks estimated gestational age dated by an  LMP on Jan 23, 2005 and confirmed with an 18-week ultrasound.  The patient  presented with one hour history of painless thin vaginal bleeding followed  by spotting.  No history of ruptured membranes.  No history of trauma.  Some  decreased fetal movement.  Patient was admitted and had a BPP which  performed a 6/8 as well as a CBC, DIC panel, and urine drug screen all which  was within normal limits with a urine drug screen being negative, DIC panel  within normal, platelets of 189, hemoglobin of 10.8.  The patient was  admitted for overnight observation and had a repeat ultrasound performed on  September 16, 2005 for growth, BPP, and AFI.  Preliminary report showed a  single gestation female with anterior placenta previa, no evidence for  abruption, grade 1.  Fetal growth showed BPD of 35 weeks 0 days, head  circumference 35 weeks 5 days, abdominal circumference 37 weeks 5 days,  femur length 36 weeks 5 days, estimated date of confinement __________ ,  estimated fetal weight 2949 g by __________  which ranked it in the 50-75th  percentile with normal amniotic fluid 14.5 cm for 37-week gestation.  BPP  scored 8/8.  At 1300 on date of discharge patient complained of a headache  which was relieved by Tylenol.  PIH panel laboratories were performed which  showed a uric acid of 3.8, AST was 16, ALT was 8.  LDH was 136.  Creatinine  was 0.7.  Hemoglobin was 9.5.  Patient's headache did resolve after  receiving Tylenol and patient did not complain of any vision changes or  right upper quadrant pain.  Patient's bleeding stopped while inpatient on  observation.  Electronic fetal monitoring was continued throughout the  hospitalization, was noted to be reassuring on September 17, 2005.   FOLLOW-UP:  Patient is to follow up with the high risk clinic on Thursday at  9 a.m. as well as having a non-stress test and a BPP performed.   DIET:  No restrictions.   ACTIVITY:  No restrictions.  Sexual activity:  No restrictions.  The patient  will be sent home with term labor precautions.      Barth Kirks, M.D.    ______________________________  Lesly Dukes, M.D.    MB/MEDQ  D:  09/16/2005  T:  09/16/2005  Job:  [id number]

## 2011-01-17 NOTE — Group Therapy Note (Signed)
NAME:  [name], [name] NO.:  [id number]   MEDICAL RECORD NO.:  [id number]          PATIENT TYPE:  [facility]   LOCATION:  [facility]                   FACILITY:  [facility]   PHYSICIAN:  Argentina Donovan, MD        DATE OF BIRTH:  Aug 31, 1981   DATE OF SERVICE:  02/18/2006                                    CLINIC NOTE   CHIEF COMPLAINT:  The patient is coming for cryosurgery.   SUBJECTIVE:  [name] is a 30 year old African-American female  with history of colposcopy performed on December 23, 2005.  Indication were  November 20, 2005 Pap showing L-SIL suspicious for H-SIL.  Colposcopy showed  impression compatible with CIN-2.  On the cytologic examination from  pathology shows high grade squamous intraepithelial lesion and low-grade  squamous intraepithelial lesions.  Since the patient is 30 years old we  decided to proceed with cryotherapy.   OBJECTIVE:  VITAL SIGNS:  Blood pressure 116/67, pulse 57, respiratory rate  20, temperature 97.4.  GENERAL:  The patient in no acute distress.   PROCEDURE NOTE:  The patient was placed on the gynecologic position,  speculum placed.  Cryotherapy was applied three minutes __________, five  minutes deep breath.  Two cycles repeated.  The patient tolerated well  procedure.   ASSESSMENT/PLAN:  Cryotherapy performed.  The patient instructed to use  abstinence and no condoms and nothing per vaginal during the next six weeks  following procedure.  She could take ibuprofen for pain control.  She will  have follow-up appointment in six months for a repeat Pap smear.           ______________________________  Argentina Donovan, MD     PR/MEDQ  D:  02/18/2006  T:  02/18/2006  Job:  [id number]

## 2011-03-01 ENCOUNTER — Inpatient Hospital Stay (HOSPITAL_COMMUNITY): Payer: Self-pay

## 2011-03-01 ENCOUNTER — Inpatient Hospital Stay (HOSPITAL_COMMUNITY)
Admission: AD | Admit: 2011-03-01 | Discharge: 2011-03-01 | Disposition: A | Discharge status: Home / Self Care | Payer: Self-pay | Source: Ambulatory Visit | Attending: Obstetrics and Gynecology | Admitting: Obstetrics and Gynecology

## 2011-03-01 DIAGNOSIS — R109 Unspecified abdominal pain: Secondary | ICD-10-CM

## 2011-03-01 DIAGNOSIS — R300 Dysuria: Secondary | ICD-10-CM

## 2011-03-01 LAB — POCT PREGNANCY, URINE: Preg Test, Ur: NEGATIVE

## 2011-03-01 LAB — URINALYSIS, ROUTINE W REFLEX MICROSCOPIC
Bilirubin Urine: NEGATIVE
Ketones, ur: NEGATIVE mg/dL
Nitrite: NEGATIVE
Urobilinogen, UA: 4 mg/dL — ABNORMAL HIGH (ref 0.0–1.0)

## 2011-03-03 LAB — GC/CHLAMYDIA PROBE AMP, GENITAL: Chlamydia, DNA Probe: NEGATIVE

## 2011-05-22 LAB — URINALYSIS, ROUTINE W REFLEX MICROSCOPIC
Ketones, ur: NEGATIVE
Nitrite: NEGATIVE
Specific Gravity, Urine: 1.015
pH: 6

## 2011-05-22 LAB — WET PREP, GENITAL: Trich, Wet Prep: NONE SEEN

## 2011-05-22 LAB — POCT PREGNANCY, URINE: Operator id: 120561

## 2011-05-22 LAB — HCG, QUANTITATIVE, PREGNANCY: hCG, Beta Chain, Quant, S: <2

## 2011-05-26 LAB — GC/CHLAMYDIA PROBE AMP, GENITAL: Chlamydia, DNA Probe: NEGATIVE

## 2011-05-26 LAB — URINALYSIS, ROUTINE W REFLEX MICROSCOPIC
Glucose, UA: NEGATIVE
Nitrite: NEGATIVE
Protein, ur: NEGATIVE

## 2011-05-26 LAB — CBC
HCT: 41.7
Hemoglobin: 14
RBC: 4.5
RDW: 13.6
WBC: 6.9

## 2011-05-26 LAB — POCT PREGNANCY, URINE: Preg Test, Ur: NEGATIVE

## 2011-05-26 LAB — WET PREP, GENITAL: Trich, Wet Prep: NONE SEEN

## 2011-05-27 LAB — POCT URINALYSIS DIP (DEVICE)
Bilirubin Urine: NEGATIVE
Hgb urine dipstick: NEGATIVE
Ketones, ur: NEGATIVE
Specific Gravity, Urine: 1.02
pH: 5.5

## 2011-05-27 LAB — GC/CHLAMYDIA PROBE AMP, GENITAL
Chlamydia, DNA Probe: NEGATIVE
GC Probe Amp, Genital: NEGATIVE

## 2011-05-27 LAB — WET PREP, GENITAL
Clue Cells Wet Prep HPF POC: NONE SEEN
Trich, Wet Prep: NONE SEEN
Yeast Wet Prep HPF POC: NONE SEEN

## 2011-05-29 LAB — DIFFERENTIAL
Basophils Absolute: 0
Eosinophils Relative: 4
Lymphocytes Relative: 40
Lymphs Abs: 2.4
Neutro Abs: 3.1

## 2011-05-29 LAB — APTT: aPTT: 38 — ABNORMAL HIGH

## 2011-05-29 LAB — BASIC METABOLIC PANEL
BUN: 7
Calcium: 8.9
GFR calc non Af Amer: >60
Glucose, Bld: 88
Potassium: 4
Sodium: 138

## 2011-05-29 LAB — CBC
HCT: 38.7
Platelets: 201
RDW: 13
WBC: 6.2

## 2011-05-29 LAB — PROTIME-INR
INR: 1.2
Prothrombin Time: 15.2

## 2011-06-10 LAB — WET PREP, GENITAL
Clue Cells Wet Prep HPF POC: NONE SEEN
Trich, Wet Prep: NONE SEEN

## 2011-06-10 LAB — URINALYSIS, ROUTINE W REFLEX MICROSCOPIC
Bilirubin Urine: NEGATIVE
Ketones, ur: NEGATIVE
Nitrite: NEGATIVE
Protein, ur: NEGATIVE
Urobilinogen, UA: 1

## 2011-06-10 LAB — GC/CHLAMYDIA PROBE AMP, GENITAL: GC Probe Amp, Genital: NEGATIVE

## 2011-06-10 LAB — PREGNANCY, URINE: Preg Test, Ur: NEGATIVE

## 2011-06-11 LAB — POCT PREGNANCY, URINE
Operator id: 113551
Preg Test, Ur: NEGATIVE

## 2011-06-11 LAB — CBC
HCT: 37.9
Hemoglobin: 13.1
MCHC: 34.6
Platelets: 203
RDW: 13.6

## 2011-06-11 LAB — GC/CHLAMYDIA PROBE AMP, GENITAL: Chlamydia, DNA Probe: NEGATIVE

## 2011-06-11 LAB — WET PREP, GENITAL

## 2011-06-11 LAB — URINALYSIS, ROUTINE W REFLEX MICROSCOPIC
Hgb urine dipstick: NEGATIVE
Nitrite: NEGATIVE
Protein, ur: NEGATIVE
Specific Gravity, Urine: 1.02
Urobilinogen, UA: 1

## 2011-06-13 LAB — CBC
HCT: 36.1
Hemoglobin: 12
MCV: 86.1
WBC: 7

## 2011-06-19 LAB — CBC
HCT: 28.1 — ABNORMAL LOW
Hemoglobin: 9.3 — ABNORMAL LOW
MCHC: 33.1
Platelets: 214
RDW: 14.9 — ABNORMAL HIGH

## 2013-03-15 ENCOUNTER — Emergency Department (HOSPITAL_COMMUNITY)
Admission: EM | Admit: 2013-03-15 | Discharge: 2013-03-15 | Disposition: A | Discharge status: Home / Self Care | Payer: Self-pay | Attending: Emergency Medicine | Admitting: Emergency Medicine

## 2013-03-15 ENCOUNTER — Encounter (HOSPITAL_COMMUNITY): Payer: Self-pay | Admitting: Physical Medicine and Rehabilitation

## 2013-03-15 DIAGNOSIS — R51 Headache: Secondary | ICD-10-CM | POA: Insufficient documentation

## 2013-03-15 MED ORDER — METOCLOPRAMIDE HCL 5 MG/ML IJ SOLN
10.0000 mg | Freq: Once | INTRAMUSCULAR | Status: AC
  Administered 2013-03-15: 10 mg via INTRAVENOUS
  Filled 2013-03-15: qty 2

## 2013-03-15 MED ORDER — SODIUM CHLORIDE 0.9 % IV BOLUS (SEPSIS)
1000.0000 mL | Freq: Once | INTRAVENOUS | Status: AC
  Administered 2013-03-15: 1000 mL via INTRAVENOUS

## 2013-03-15 MED ORDER — HYDROMORPHONE HCL PF 1 MG/ML IJ SOLN
1.0000 mg | Freq: Once | INTRAMUSCULAR | Status: AC
  Administered 2013-03-15: 1 mg via INTRAVENOUS
  Filled 2013-03-15: qty 1

## 2013-03-15 NOTE — ED Notes (Signed)
Pt discharged home. Friend at bedside to walk home with her, pt states she lives close by. Vital signs stable. No signs of distress noted.

## 2013-03-15 NOTE — ED Provider Notes (Signed)
Medical screening examination/treatment/procedure(s) were performed by non-physician practitioner and as supervising physician I was immediately available for consultation/collaboration.  Shaney Deckman, MD 03/15/13 1439 

## 2013-03-15 NOTE — ED Provider Notes (Signed)
   History    CSN: [id number] Arrival date & time 03/15/13  7829  First MD Initiated Contact with Patient 03/15/13 [phone]     Chief Complaint  Patient presents with  . Headache   (Consider location/radiation/quality/duration/timing/severity/associated sxs/prior Treatment) The history is provided by the patient and medical records.   Pt presents to the ED for headache.  Pt states it has been ongoing for 1 week, localized to her forehead, and is associated with photophobia and phonophobia.  No aura, visual disturbance, dizziness, weakness, numbness, tinnitus, confusion, trouble concentrating, or AMS.  Pt states she works at a [employer] and has been unable to work due to loud noises there.  No hx of migraines.  No fevers, sweats, or chills.  Tried taking OTC meds without relief.  No past medical history on file. No past surgical history on file. No family history on file. History  Substance Use Topics  . Smoking status: Not on file  . Smokeless tobacco: Not on file  . Alcohol Use: Not on file   OB History   No data available     Review of Systems  Eyes: Positive for photophobia.  Neurological: Positive for headaches.  All other systems reviewed and are negative.    Allergies  Review of patient's allergies indicates not on file.  Home Medications  No current outpatient prescriptions on file. BP 117/55  Pulse 75  Temp(Src) 97.9 F (36.6 C) (Oral)  Resp 18  SpO2 99%  Physical Exam  Nursing note and vitals reviewed. Constitutional: She is oriented to person, place, and time. She appears well-developed and well-nourished.  HENT:  Head: Normocephalic and atraumatic.  Mouth/Throat: Oropharynx is clear and moist.  Eyes: Conjunctivae, EOM and lids are normal. Pupils are equal, round, and reactive to light.  PERRL, direct and consensual  Neck: Normal range of motion. Neck supple. No rigidity.  No meningeal signs  Cardiovascular: Normal rate, regular rhythm and  normal heart sounds.   Pulmonary/Chest: Effort normal and breath sounds normal.  Abdominal: Soft. Bowel sounds are normal. There is no tenderness. There is no guarding.  Musculoskeletal: Normal range of motion.  Neurological: She is alert and oriented to person, place, and time. She has normal strength. She displays no tremor. No cranial nerve deficit or sensory deficit. She displays no seizure activity.  CN grossly intact, moves all extremities appropriately without ataxia, no focal neuro deficits or facial droop appreciated  Skin: Skin is warm and dry.  Psychiatric: She has a normal mood and affect.    ED Course  Procedures (including critical care time) Labs Reviewed - No data to display No results found. 1. Headache     MDM   Headache without focal neuro deficits, suspicious for migraine.  Doubt TIA, stroke, ICH, SAH, or meningitis.  Pain meds given, will re-evaluate.  9:05 AM Pt sleeping in room.  Now rates headache 2/10 and feels that she can rest comfortably at home.  Advised to take excedrin migraine if headache rebounds.  Discussed plan with pt, she agreed.  Return precautions advised.  Garlon Hatchet, PA-C 03/15/13 0910  Garlon Hatchet, PA-C 03/15/13 [phone]

## 2013-03-15 NOTE — ED Notes (Signed)
Pt presents to department for evaluation of headache. Ongoing x1 week. Reports body aches and photosensitivity. No nausea/vomiting. No blurred vision. 10/10 pain at the time. Pt is alert and oriented x4.

## 2013-03-15 NOTE — ED Notes (Signed)
Pt medicated for headache. Resting quietly at the time. Vital signs stable. No signs of distress noted. Pt remains alert and oriented x4.

## 2013-11-09 ENCOUNTER — Encounter (HOSPITAL_COMMUNITY): Payer: Self-pay | Admitting: General Practice

## 2013-11-09 ENCOUNTER — Inpatient Hospital Stay (HOSPITAL_COMMUNITY): Payer: Medicaid Other

## 2013-11-09 ENCOUNTER — Telehealth: Payer: Self-pay | Admitting: *Deleted

## 2013-11-09 ENCOUNTER — Inpatient Hospital Stay (HOSPITAL_COMMUNITY)
Admission: AD | Admit: 2013-11-09 | Discharge: 2013-11-09 | Disposition: A | Discharge status: Home / Self Care | Payer: Self-pay | Source: Ambulatory Visit | Attending: Obstetrics & Gynecology | Admitting: Obstetrics & Gynecology

## 2013-11-09 DIAGNOSIS — O021 Missed abortion: Secondary | ICD-10-CM

## 2013-11-09 DIAGNOSIS — R109 Unspecified abdominal pain: Secondary | ICD-10-CM | POA: Insufficient documentation

## 2013-11-09 LAB — WET PREP, GENITAL
TRICH WET PREP: NONE SEEN
YEAST WET PREP: NONE SEEN

## 2013-11-09 LAB — CBC
HCT: 37.6 % (ref 36.0–46.0)
Hemoglobin: 13.1 g/dL (ref 12.0–15.0)
MCH: 31.4 pg (ref 26.0–34.0)
MCHC: 34.8 g/dL (ref 30.0–36.0)
MCV: 90.2 fL (ref 78.0–100.0)
Platelets: 208 10*3/uL (ref 150–400)
RBC: 4.17 MIL/uL (ref 3.87–5.11)
RDW: 12.4 % (ref 11.5–15.5)
WBC: 4.8 10*3/uL (ref 4.0–10.5)

## 2013-11-09 LAB — URINALYSIS, ROUTINE W REFLEX MICROSCOPIC
Bilirubin Urine: NEGATIVE
Glucose, UA: NEGATIVE mg/dL
Ketones, ur: NEGATIVE mg/dL
NITRITE: NEGATIVE
PH: 7.5 (ref 5.0–8.0)
Protein, ur: NEGATIVE mg/dL
SPECIFIC GRAVITY, URINE: 1.01 (ref 1.005–1.030)
UROBILINOGEN UA: 0.2 mg/dL (ref 0.0–1.0)

## 2013-11-09 LAB — POCT PREGNANCY, URINE: PREG TEST UR: POSITIVE — AB

## 2013-11-09 LAB — URINE MICROSCOPIC-ADD ON

## 2013-11-09 LAB — HCG, QUANTITATIVE, PREGNANCY: hCG, Beta Chain, Quant, S: 41571 m[IU]/mL — ABNORMAL HIGH (ref <5)

## 2013-11-09 MED ORDER — MISOPROSTOL 200 MCG PO TABS: 200.0000 ug | ORAL_TABLET | Freq: Once | ORAL | Status: DC

## 2013-11-09 MED ORDER — IBUPROFEN 600 MG PO TABS: 600.0000 mg | ORAL_TABLET | Freq: Four times a day (QID) | ORAL | Status: DC | PRN

## 2013-11-09 MED ORDER — OXYCODONE-ACETAMINOPHEN 5-325 MG PO TABS: 1.0000 | ORAL_TABLET | ORAL | Status: DC | PRN

## 2013-11-09 NOTE — Telephone Encounter (Signed)
Patient called and left message that she is returning our phone call. Called patient and she stated that she already got her questions answered and just got back from the hospital and was told she is having a miscarriage. Apologized to patient and asked if there was anything else we could do and she stated no that was okay. Told patient to call us back if she later has any questions or concerns. Patient verbalized understanding and had no further questions

## 2013-11-09 NOTE — Telephone Encounter (Signed)
Pt left message stating that she has a question. I returned the call and left a message stating that she may call back and state her question on our nurse voice mail. Also, please indicate whether we may leave detailed information on her voice mail when we call back. *Note: need to verify pt's contact number- she gave [phone]. If this is her new number, update the demographics.

## 2013-11-09 NOTE — Discharge Instructions (Signed)

## 2013-11-09 NOTE — [facility] Note (Signed)
Patient states she had a positive home pregnancy test. Had an IUD taken out in December. States she had a period January 2 then no more bleeding until 2-7 that was an "unusual" bleeding the she states smelled like "pregnancy bleeding". Patient states she is having abdominal cramping and had spotting last night, none this am.

## 2013-11-09 NOTE — [facility] Provider Note (Signed)
Chief Complaint  Patient presents with  . Possible Pregnancy    Subjective Shelly Silva 33 y.o.  [id number] at 7880w5d byLNMP presents with  episode of small amount pink-red vaginal bleeding last night, today brown spotting continues. Had 1 day moderate blood flow like a light period on 10/08/13. Shelly Silva 09/02/13. HPT positive 2 wks ago. Had moderately severe menstrual-like crampy lower abdominal pain 2-3 days ago, now very mild. Last intercourse 1 wk ago.  Denies irritative vaginal discharge. No dysuria or hematuria.  Blood type: A pos  Pregnancy course: NPC  Pertinent Medical History: asthma Pertinent Ob/Gyn History: SAB x3; cryo for HGIL 2007; Shelly&C x3 Pertinent Surgical History: see GYN Pertinent Social History: smoker  Prescriptions prior to admission  Medication Sig Dispense Refill  . albuterol (PROVENTIL HFA;VENTOLIN HFA) 108 (90 BASE) MCG/ACT inhaler Inhale 2 puffs into the lungs every 6 (six) hours as needed for wheezing.        No Known Allergies   Objective   Filed Vitals:   11/09/13 1155  BP: 111/60  Pulse: 80  Temp: 98.5 F (36.9 C)  Resp: 16     Physical Exam General: WN/WD in NAD  Abdom: soft, NT External genitalia: normal; BUS neg  SSE: small amount brown blood; cervix with no lesions, appears closed Bimanual: Cervix closed, long; uterus anteverted, NT, 6-8 weeks size; adnexa nontender, no masses   Lab Results Results for orders placed during the hospital encounter of 11/09/13 (from the past 24 hour(s))  URINALYSIS, ROUTINE W REFLEX MICROSCOPIC     Status: Abnormal   Collection Time    11/09/13 12:13 PM      Result Value Ref Range   Color, Urine YELLOW  YELLOW   APPearance HAZY (*) CLEAR   Specific Gravity, Urine 1.010  1.005 - 1.030   pH 7.5  5.0 - 8.0   Glucose, UA NEGATIVE  NEGATIVE mg/dL   Hgb urine dipstick LARGE (*) NEGATIVE   Bilirubin Urine NEGATIVE  NEGATIVE   Ketones, ur NEGATIVE  NEGATIVE mg/dL   Protein, ur NEGATIVE  NEGATIVE mg/dL    Urobilinogen, UA 0.2  0.0 - 1.0 mg/dL   Nitrite NEGATIVE  NEGATIVE   Leukocytes, UA SMALL (*) NEGATIVE  URINE MICROSCOPIC-ADD ON     Status: Abnormal   Collection Time    11/09/13 12:13 PM      Result Value Ref Range   Squamous Epithelial / LPF FEW (*) RARE   WBC, UA 3-6  <3 WBC/hpf   RBC / HPF 0-2  <3 RBC/hpf  POCT PREGNANCY, URINE     Status: Abnormal   Collection Time    11/09/13 12:15 PM      Result Value Ref Range   Preg Test, Ur POSITIVE (*) NEGATIVE  CBC     Status: None   Collection Time    11/09/13  1:34 PM      Result Value Ref Range   WBC 4.8  4.0 - 10.5 K/uL   RBC 4.17  3.87 - 5.11 MIL/uL   Hemoglobin 13.1  12.0 - 15.0 g/dL   HCT 69.637.6  29.536.0 - 28.446.0 %   MCV 90.2  78.0 - 100.0 fL   MCH 31.4  26.0 - 34.0 pg   MCHC 34.8  30.0 - 36.0 g/dL   RDW 13.212.4  44.011.5 - 10.215.5 %   Platelets 208  150 - 400 K/uL  WET PREP, GENITAL     Status: Abnormal   Collection Time    11/09/13  1:40 PM      Result Value Ref Range   Yeast Wet Prep HPF POC NONE SEEN  NONE SEEN   Trich, Wet Prep NONE SEEN  NONE SEEN   Clue Cells Wet Prep HPF POC FEW (*) NONE SEEN   WBC, Wet Prep HPF POC FEW (*) NONE SEEN  HCG, QUANTITATIVE, PREGNANCY     Status: Abnormal   Collection Time    11/09/13  1:49 PM      Result Value Ref Range   hCG, Beta Chain, Quant, S [id number] (*) <5 mIU/mL    Ultrasound    CLINICAL DATA  Bleeding and cramping. Evaluate viability  EXAM  OBSTETRIC <14 WK ULTRASOUND  TECHNIQUE  Transabdominal ultrasound was performed for evaluation of the  gestation as well as the maternal uterus and adnexal regions.  COMPARISON  03/01/2011  FINDINGS  Intrauterine gestational sac: Present, but subjectively small for  fetal size.  Yolk sac: Present.  Embryo: Present  Cardiac Activity: Not seen  CRL: 17.2 mm 8 w 2 Shelly  Maternal uterus/adnexae: There is a large subchorionic hematoma,  measuring 5.6 x 2.1 x 1.4 cm. No significant free pelvic fluid.  There is a large, simple appearing cyst in  the left ovary, measuring  6.2 x 4.1 x 5.4 cm.  IMPRESSION  1. Single intrauterine gestation with 17 mm crown-rump length and no  cardiac activity. Findings meet definitive criteria for failed  pregnancy. This follows SRU consensus guidelines: Diagnostic  Criteria for Nonviable Pregnancy Early in the First Trimester. N  Engl J Med [id number].  2. Large subchorionic hematoma.  3. Simple 6 cm cyst in the left ovary. This is almost certainly  benign, but follow up ultrasound is recommended in 1 year according  to the Society of Radiologists in Ultrasound2010 Consensus  Conference Statement (Shelly Silva et al. Management of Asymptomatic  Ovarian and Other Adnexal Cysts Imaged at [place]: Society of  Radiologists in Ultrasound Consensus Conference Statement 2010.  Radiology 256 (Sept 2010): 943-954.).  SIGNATURE  Electronically Signed  By: Shelly Silva M.Shelly.  On: 11/09/2013 14:54   [facility] Course:    Discussed dx and options. Pt has clear preference for cytotec anad meets criteria Assessment 1. Embryonic demise      Plan    GC/CT sent Discharge home See AVS for pt education   Medication List         albuterol 108 (90 BASE) MCG/ACT inhaler  Commonly known as:  PROVENTIL HFA;VENTOLIN HFA  Inhale 2 puffs into the lungs every 6 (six) hours as needed for wheezing.     ibuprofen 600 MG tablet  Commonly known as:  ADVIL,MOTRIN  Take 1 tablet (600 mg total) by mouth every 6 (six) hours as needed.     misoprostol 200 MCG tablet  Commonly known as:  CYTOTEC  Take 1 tablet (200 mcg total) by mouth once. Place all 4 tabs deep in vagina at once     oxyCODONE-acetaminophen 5-325 MG per tablet  Commonly known as:  PERCOCET/ROXICET  Take 1 tablet by mouth every 4 (four) hours as needed.       Follow-up Information   Follow up with [facility].   Contact information:   [address] [place] [address] [phone]         Shelly Silva 11/09/2013 1:32 PM

## 2013-11-10 LAB — GC/CHLAMYDIA PROBE AMP
CT Probe RNA: NEGATIVE
GC Probe RNA: NEGATIVE

## 2013-11-13 ENCOUNTER — Encounter (HOSPITAL_COMMUNITY): Payer: Self-pay

## 2013-11-13 ENCOUNTER — Inpatient Hospital Stay (HOSPITAL_COMMUNITY)
Admission: AD | Admit: 2013-11-13 | Discharge: 2013-11-13 | Disposition: A | Discharge status: Home / Self Care | Payer: Self-pay | Source: Ambulatory Visit | Attending: Obstetrics & Gynecology | Admitting: Obstetrics & Gynecology

## 2013-11-13 DIAGNOSIS — F17200 Nicotine dependence, unspecified, uncomplicated: Secondary | ICD-10-CM | POA: Insufficient documentation

## 2013-11-13 DIAGNOSIS — J45909 Unspecified asthma, uncomplicated: Secondary | ICD-10-CM | POA: Insufficient documentation

## 2013-11-13 DIAGNOSIS — O364XX0 Maternal care for intrauterine death, not applicable or unspecified: Secondary | ICD-10-CM | POA: Insufficient documentation

## 2013-11-13 DIAGNOSIS — IMO0002 Reserved for concepts with insufficient information to code with codable children: Secondary | ICD-10-CM

## 2013-11-13 HISTORY — DX: Headache: R51

## 2013-11-13 HISTORY — DX: Personal history of other complications of pregnancy, childbirth and the puerperium: Z87.59

## 2013-11-13 HISTORY — DX: Unspecified asthma, uncomplicated: J45.909

## 2013-11-13 LAB — CBC
HCT: 32.5 % — ABNORMAL LOW (ref 36.0–46.0)
Hemoglobin: 11.4 g/dL — ABNORMAL LOW (ref 12.0–15.0)
MCH: 31.7 pg (ref 26.0–34.0)
MCHC: 35.1 g/dL (ref 30.0–36.0)
MCV: 90.3 fL (ref 78.0–100.0)
Platelets: 191 10*3/uL (ref 150–400)
RBC: 3.6 MIL/uL — ABNORMAL LOW (ref 3.87–5.11)
RDW: 12.3 % (ref 11.5–15.5)
WBC: 7 10*3/uL (ref 4.0–10.5)

## 2013-11-13 LAB — URINALYSIS, ROUTINE W REFLEX MICROSCOPIC
BILIRUBIN URINE: NEGATIVE
Glucose, UA: NEGATIVE mg/dL
Ketones, ur: NEGATIVE mg/dL
Leukocytes, UA: NEGATIVE
NITRITE: NEGATIVE
Protein, ur: NEGATIVE mg/dL
Urobilinogen, UA: 1 mg/dL (ref 0.0–1.0)
pH: 6 (ref 5.0–8.0)

## 2013-11-13 LAB — URINE MICROSCOPIC-ADD ON

## 2013-11-13 MED ORDER — OXYCODONE-ACETAMINOPHEN 5-325 MG PO TABS: 1.0000 | ORAL_TABLET | ORAL | Status: DC | PRN

## 2013-11-13 MED ORDER — MISOPROSTOL 200 MCG PO TABS: 800.0000 ug | ORAL_TABLET | Freq: Once | ORAL | Status: DC

## 2013-11-13 MED ORDER — MISOPROSTOL 200 MCG PO TABS: 200.0000 ug | ORAL_TABLET | Freq: Once | ORAL | Status: DC

## 2013-11-13 NOTE — MAU Provider Note (Signed)
History     CSN: [id number]  Arrival date and time: 11/13/13 1751   First Provider Initiated Contact with Patient 11/13/13 1921      Chief Complaint  Patient presents with  . Vaginal Discharge   HPI  [name] is a 33 y.o. female [phone] at Unknown gestation who presents to MAU with concerns following being told on 3/11 that she had an 8 week fetal demise. She was given an RX for cytotec and she put all 4 pills in the vagina at one time. She has had no cramping and no bleeding at all since the cytotec. The patient works full time and in the last 3 days, since the cytotec she has noticed an odor from her vagina that she cannot tolerate. Her co-workers told her the smell was so strong that they could smell it.   OB History   Grav Para Term Preterm Abortions TAB SAB Ect Mult Living   9 5 4 1 3  3   5       Past Medical History  Diagnosis Date  . History of miscarriage   . Asthma   . GNFAOZHY(865.7Headache(784.0)     Past Surgical History  Procedure Laterality Date  . Dilatation & curettage/hysteroscopy with trueclear  2003, 2006, 2006  . Gynecologic cryosurgery  2007  . Dilation and curettage of uterus      History reviewed. No pertinent family history.  History  Substance Use Topics  . Smoking status: Current Some Day Smoker -- 0.25 packs/day    Types: Cigarettes  . Smokeless tobacco: Never Used  . Alcohol Use: Yes     Comment: social    Allergies: No Known Allergies  Prescriptions prior to admission  Medication Sig Dispense Refill  . ibuprofen (ADVIL,MOTRIN) 600 MG tablet Take 1 tablet (600 mg total) by mouth every 6 (six) hours as needed.  30 tablet  1  . misoprostol (CYTOTEC) 200 MCG tablet Take 1 tablet (200 mcg total) by mouth once. Place all 4 tabs deep in vagina at once  4 tablet  0  . albuterol (PROVENTIL HFA;VENTOLIN HFA) 108 (90 BASE) MCG/ACT inhaler Inhale 2 puffs into the lungs every 6 (six) hours as needed for wheezing.        Results for orders  placed during the hospital encounter of 11/13/13 (from the past 48 hour(s))  URINALYSIS, ROUTINE W REFLEX MICROSCOPIC     Status: Abnormal   Collection Time    11/13/13  6:15 PM      Result Value Ref Range   Color, Urine YELLOW  YELLOW   APPearance CLEAR  CLEAR   Specific Gravity, Urine >1.030 (*) 1.005 - 1.030   pH 6.0  5.0 - 8.0   Glucose, UA NEGATIVE  NEGATIVE mg/dL   Hgb urine dipstick TRACE (*) NEGATIVE   Bilirubin Urine NEGATIVE  NEGATIVE   Ketones, ur NEGATIVE  NEGATIVE mg/dL   Protein, ur NEGATIVE  NEGATIVE mg/dL   Urobilinogen, UA 1.0  0.0 - 1.0 mg/dL   Nitrite NEGATIVE  NEGATIVE   Leukocytes, UA NEGATIVE  NEGATIVE  URINE MICROSCOPIC-ADD ON     Status: Abnormal   Collection Time    11/13/13  6:15 PM      Result Value Ref Range   Squamous Epithelial / LPF FEW (*) RARE   WBC, UA 0-2  <3 WBC/hpf   RBC / HPF 0-2  <3 RBC/hpf   Bacteria, UA MANY (*) RARE    CLINICAL DATA  Bleeding  and cramping. Evaluate viability  EXAM  OBSTETRIC <14 WK ULTRASOUND  TECHNIQUE  Transabdominal ultrasound was performed for evaluation of the  gestation as well as the maternal uterus and adnexal regions.  COMPARISON  03/01/2011  FINDINGS  Intrauterine gestational sac: Present, but subjectively small for  fetal size.  Yolk sac: Present.  Embryo: Present  Cardiac Activity: Not seen  CRL: 17.2 mm 8 w 2 d  Maternal uterus/adnexae: There is a large subchorionic hematoma,  measuring 5.6 x 2.1 x 1.4 cm. No significant free pelvic fluid.  There is a large, simple appearing cyst in the left ovary, measuring  6.2 x 4.1 x 5.4 cm.  IMPRESSION  1. Single intrauterine gestation with 17 mm crown-rump length and no  cardiac activity. Findings meet definitive criteria for failed  pregnancy. This follows SRU consensus guidelines: Diagnostic  Criteria for Nonviable Pregnancy Early in the First Trimester. N  Engl J Med [phone].  2. Large subchorionic hematoma.  3. Simple 6 cm cyst in the left  ovary. This is almost certainly  benign, but follow up ultrasound is recommended in 1 year according  to the Society of Radiologists in Ultrasound2010 Consensus  Conference Statement (D Lenis Noon et al. Management of Asymptomatic  Ovarian and Other Adnexal Cysts Imaged at [place]: Society of  Radiologists in Ultrasound Consensus Conference Statement 2010.  Radiology 256 (Sept 2010): 943-954.).  SIGNATURE  Electronically Signed  By: Tiburcio Pea M.D.  On: 11/09/2013 14:54   Review of Systems  Constitutional: Negative for fever and chills.  Gastrointestinal: Negative for nausea, vomiting, abdominal pain, diarrhea and constipation.  Genitourinary:       No vaginal discharge. No vaginal bleeding. No dysuria.    Physical Exam   Blood pressure 120/53, pulse 67, temperature 98.3 F (36.8 C), temperature source Oral, resp. rate 18, last menstrual period 10/08/2013.  Physical Exam  Constitutional: She is oriented to person, place, and time. She appears well-developed and well-nourished.  Non-toxic appearance. She does not have a sickly appearance. She does not appear ill. No distress.  HENT:  Head: Normocephalic.  Eyes: Pupils are equal, round, and reactive to light.  Neck: Neck supple.  GI: Soft. She exhibits no distension. There is no tenderness. There is no rebound and no guarding.  Genitourinary: Vaginal discharge found.  Speculum exam: Vagina - Small amount of creamy, pink discharge, strong odor Cervix - No contact bleeding, no active bleeding  Bimanual exam: Cervix closed, thick, No CMT  Chaperone present for exam.   Musculoskeletal: Normal range of motion.  Neurological: She is alert and oriented to person, place, and time.  Skin: Skin is warm. She is not diaphoretic.  Psychiatric: Her behavior is normal.    MAU Course  Procedures None  MDM CBC Results for orders placed during the hospital encounter of 11/13/13 (from the past 24 hour(s))  URINALYSIS, ROUTINE W REFLEX  MICROSCOPIC     Status: Abnormal   Collection Time    11/13/13  6:15 PM      Result Value Ref Range   Color, Urine YELLOW  YELLOW   APPearance CLEAR  CLEAR   Specific Gravity, Urine >1.030 (*) 1.005 - 1.030   pH 6.0  5.0 - 8.0   Glucose, UA NEGATIVE  NEGATIVE mg/dL   Hgb urine dipstick TRACE (*) NEGATIVE   Bilirubin Urine NEGATIVE  NEGATIVE   Ketones, ur NEGATIVE  NEGATIVE mg/dL   Protein, ur NEGATIVE  NEGATIVE mg/dL   Urobilinogen, UA 1.0  0.0 - 1.0 mg/dL  Nitrite NEGATIVE  NEGATIVE   Leukocytes, UA NEGATIVE  NEGATIVE  URINE MICROSCOPIC-ADD ON     Status: Abnormal   Collection Time    11/13/13  6:15 PM      Result Value Ref Range   Squamous Epithelial / LPF FEW (*) RARE   WBC, UA 0-2  <3 WBC/hpf   RBC / HPF 0-2  <3 RBC/hpf   Bacteria, UA MANY (*) RARE  CBC     Status: Abnormal   Collection Time    11/13/13  7:45 PM      Result Value Ref Range   WBC 7.0  4.0 - 10.5 K/uL   RBC 3.60 (*) 3.87 - 5.11 MIL/uL   Hemoglobin 11.4 (*) 12.0 - 15.0 g/dL   HCT 54.0 (*) 98.1 - 19.1 %   MCV 90.3  78.0 - 100.0 fL   MCH 31.7  26.0 - 34.0 pg   MCHC 35.1  30.0 - 36.0 g/dL   RDW 47.8  29.5 - 62.1 %   Platelets 191  150 - 400 K/uL    Discussed plan of care with Dr. Erin Fulling Patient given instructions to insert an additional 800 mcg of cytotec in the vagina today; if no results in 8 hrs pt is to insert an additional 800 mcg of cytotec in the vagina.  Pt is to follow up in the clinic on Wednesday March 18  (appt marked in the book) 1:00  Assessment and Plan   A:  1. Fetal demise    P:  Discharge home in stable condition RX: Cytotec:  patient given instructions to insert an additional 800 mcg of cytotec in the vagina today; if no results in 8 hrs pt is to insert an additional 800 mcg of cytotec in the vagina.         Percocet for pain  Ok to take ibuprofen as needed, as directed on the bottle  Follow up in the clinic on Wednesday at 1:00 pm Bleeding precautions  discussed Return to MAU as needed, if symptoms worsen   Iona Hansen Rasch, NP  11/13/2013, 8:02 PM

## 2013-11-13 NOTE — MAU Note (Signed)
Pt presents with complaints of a vaginal discharge with a foul odor. She states that she had cytotec for a miscarriage on Wednesday March the 11th.

## 2013-11-13 NOTE — MAU Note (Signed)
Pt states inserted cytotec as directed, however has not bled or passed any tissue since inserting medication into her vagina. Had rx for oxycodone that was not signed, therefore was not able to fill prescription. Has strong foul odor from vagina. Hx miscarriage/d&c.

## 2013-11-14 NOTE — MAU Provider Note (Signed)
Attestation of Attending Supervision of Advanced Practitioner (CNM/NP): Evaluation and management procedures were performed by the Advanced Practitioner under my supervision and collaboration.  I have reviewed the Advanced Practitioner's note and chart, and I agree with the management and plan.  HARRAWAY-SMITH, Livan Hires 7:05 AM

## 2013-11-15 ENCOUNTER — Telehealth: Payer: Self-pay | Admitting: Obstetrics and Gynecology

## 2013-11-15 NOTE — Telephone Encounter (Signed)
Left patient a message for her to call back for appointment time and date.

## 2013-11-16 ENCOUNTER — Ambulatory Visit (INDEPENDENT_AMBULATORY_CARE_PROVIDER_SITE_OTHER): Payer: Self-pay | Admitting: Obstetrics & Gynecology

## 2013-11-16 ENCOUNTER — Encounter: Payer: Self-pay | Admitting: Obstetrics & Gynecology

## 2013-11-16 VITALS — BP 110/72 | HR 87 | Temp 97.8°F | Ht 62.0 in | Wt 147.4 lb

## 2013-11-16 DIAGNOSIS — O039 Complete or unspecified spontaneous abortion without complication: Secondary | ICD-10-CM

## 2013-11-16 MED ORDER — METRONIDAZOLE 500 MG PO TABS: 500.0000 mg | ORAL_TABLET | Freq: Two times a day (BID) | ORAL | Status: DC

## 2013-11-16 NOTE — Addendum Note (Signed)
Addended by: Aldona LentoFISHER, Taren Toops L on: 11/16/2013 02:50 PM   Modules accepted: Orders

## 2013-11-16 NOTE — Progress Notes (Signed)
   Subjective:    Patient ID: Shelly ApoMarkesha C Silva, female    DOB: November 27, 1980, 33 y.o.   MRN: [id number]  HPI 33 yo P5 who is here today in the gyn clinic for follow up from the MAU after a miscarriage at 8 weeks. She took 2 courses of cytotec vaginally. She had what she thinks was complete emptying of her uterus after the second course. She has a bloody discharge and still complains of the smell.   Review of Systems     Objective:   Physical Exam   Foul smell from vagina Cervix showing only a small liquid old blood discharge No CMT     Assessment & Plan:  S/p miscarriage. Foul odor Wet prep sent I will treat empirically with flagyl

## 2013-11-16 NOTE — Progress Notes (Deleted)
   Subjective:    Patient ID: Shelly Silva, female    DOB: Jan 18, 1981, 33 y.o.   MRN: [id number]  HPI   Review of Systems     Objective:   Physical Exam        Assessment & Plan:

## 2013-11-17 LAB — WET PREP, GENITAL
CLUE CELLS WET PREP: NONE SEEN
TRICH WET PREP: NONE SEEN
WBC, Wet Prep HPF POC: NONE SEEN

## 2013-11-23 ENCOUNTER — Inpatient Hospital Stay (HOSPITAL_COMMUNITY): Payer: Medicaid Other

## 2013-11-23 ENCOUNTER — Encounter (HOSPITAL_COMMUNITY): Payer: Self-pay | Admitting: *Deleted

## 2013-11-23 ENCOUNTER — Encounter: Payer: Self-pay | Admitting: Obstetrics and Gynecology

## 2013-11-23 ENCOUNTER — Inpatient Hospital Stay (HOSPITAL_COMMUNITY)
Admission: AD | Admit: 2013-11-23 | Discharge: 2013-11-23 | Disposition: A | Discharge status: Home / Self Care | Payer: Self-pay | Source: Ambulatory Visit | Attending: Obstetrics & Gynecology | Admitting: Obstetrics & Gynecology

## 2013-11-23 DIAGNOSIS — F17200 Nicotine dependence, unspecified, uncomplicated: Secondary | ICD-10-CM | POA: Insufficient documentation

## 2013-11-23 DIAGNOSIS — O039 Complete or unspecified spontaneous abortion without complication: Secondary | ICD-10-CM | POA: Insufficient documentation

## 2013-11-23 LAB — CBC
HCT: 33.8 % — ABNORMAL LOW (ref 36.0–46.0)
Hemoglobin: 11.5 g/dL — ABNORMAL LOW (ref 12.0–15.0)
MCH: 31.3 pg (ref 26.0–34.0)
MCHC: 34 g/dL (ref 30.0–36.0)
MCV: 92.1 fL (ref 78.0–100.0)
Platelets: 223 10*3/uL (ref 150–400)
RBC: 3.67 MIL/uL — AB (ref 3.87–5.11)
RDW: 12.5 % (ref 11.5–15.5)
WBC: 7.1 10*3/uL (ref 4.0–10.5)

## 2013-11-23 MED ORDER — OXYCODONE-ACETAMINOPHEN 5-325 MG PO TABS: 1.0000 | ORAL_TABLET | ORAL | Status: DC | PRN

## 2013-11-23 MED ORDER — HYDROMORPHONE HCL PF 1 MG/ML IJ SOLN
1.0000 mg | Freq: Once | INTRAMUSCULAR | Status: AC
  Administered 2013-11-23: 1 mg via INTRAMUSCULAR
  Filled 2013-11-23: qty 1

## 2013-11-23 NOTE — Discharge Instructions (Signed)
Incomplete Miscarriage °A miscarriage is the sudden loss of an unborn baby (fetus) before the 20th week of pregnancy. In an incomplete miscarriage, parts of the fetus or placenta (afterbirth) remain in the body.  °Having a miscarriage can be an emotional experience. Talk with your health care provider about any questions you may have about miscarrying, the grieving process, and your future pregnancy plans. °CAUSES  °· Problems with the fetal chromosomes that make it impossible for the baby to develop normally. Problems with the baby's genes or chromosomes are most often the result of errors that occur by chance as the embryo divides and grows. The problems are not inherited from the parents. °· Infection of the cervix or uterus. °· Hormone problems. °· Problems with the cervix, such as having an incompetent cervix. This is when the tissue in the cervix is not strong enough to hold the pregnancy. °· Problems with the uterus, such as an abnormally shaped uterus, uterine fibroids, or congenital abnormalities. °· Certain medical conditions. °· Smoking, drinking alcohol, or taking illegal drugs. °· Trauma. °SYMPTOMS  °· Vaginal bleeding or spotting, with or without cramps or pain. °· Pain or cramping in the abdomen or lower back. °· Passing fluid, tissue, or blood clots from the vagina. °DIAGNOSIS  °Your health care provider will perform a physical exam. You may also have an ultrasound to confirm the miscarriage. Blood or urine tests may also be ordered. °TREATMENT  °· Usually, a dilation and curettage (D&C) procedure is performed. During a D&C procedure, the cervix is widened (dilated) and any remaining fetal or placental tissue is gently removed from the uterus. °· Antibiotic medicines are prescribed if there is an infection. Other medicines may be given to reduce the size of the uterus (contract) if there is a lot of bleeding. °· If you have Rh negative blood and your baby was Rh positive, you will need an Rho(D)  immune globulin shot. This shot will protect any future baby from having Rh blood problems in future pregnancies. °· You may be confined to bed rest. This means you should stay in bed and only get up to use the bathroom. °HOME CARE INSTRUCTIONS  °· Rest as directed by your health care provider. °· Restrict activity as directed by your health care provider. You may be allowed to continue light activity if curettage was not done but you require further treatment. °· Keep track of the number of pads you use each day. Keep track of how soaked (saturated) they are. Record this information. °· Do not  use tampons. °· Do not douche or have sexual intercourse until approved by your health care provider. °· Keep all follow-up appointments for re-evaluation and continuing management. °· Only take over-the-counter or prescription medicines for pain, fever, or discomfort as directed by your health care provider. °· Take antibiotic medicine as directed by your health care provider. Make sure you finish it even if you start to feel better. °SEEK IMMEDIATE MEDICAL CARE IF:  °· You experience severe cramps in your stomach, back, or abdomen. °· You have an unexplained temperature (make sure to record these temperatures). °· You pass large clots or tissue (save these for your health care provider to inspect). °· Your bleeding increases. °· You become light-headed, weak, or have fainting episodes. °MAKE SURE YOU:  °· Understand these instructions. °· Will watch your condition. °· Will get help right away if you are not doing well or get worse. °Document Released: 08/18/2005 Document Revised: 06/08/2013 Document Reviewed: 03/17/2013 °  [employer]® Patient Information ©2014 [employer]. ° °

## 2013-11-23 NOTE — MAU Note (Signed)
Pt seen in MAU on 3/11 & 3/15, took cytotec for IUFD @ 8 weeks.  Pt thought she had passed the fetus on Monday, has been bleeding ever since & passed ? Tissue today.  Pt denies pain, took oxycodone earlier for cramping.

## 2013-11-23 NOTE — MAU Provider Note (Signed)
History     CSN: [id number]  Arrival date and time: 11/23/13 [id number]   First Provider Initiated Contact with Patient 11/23/13 2001      Chief Complaint  Patient presents with  . Vaginal Bleeding   HPI [name] is a 33 y.o. [phone] [id number] at Unknown who presents to MAU today after 2 round of Cytotec with heavy bleeding. The patient states that she passed tissue and clots. She has been having some nausea without vomiting. She had fever last week which she attributes to "the flu" which has resolved. She states that she used 1/2 a pack of pads today and then started using depends diapers because the bleeding was so heavy. The patient states that the percocet works well for pain, but will stop working after ~ 4 hours and then her pain is 10/10. She states last dose around 1630 today.   OB History   Grav Para Term Preterm Abortions TAB SAB Ect Mult Living   9 5 4 1 3  3   5       Past Medical History  Diagnosis Date  . History of miscarriage   . Asthma   . GNFAOZHY(865.7Headache(784.0)     Past Surgical History  Procedure Laterality Date  . Dilatation & curettage/hysteroscopy with trueclear  2003, 2006, 2006  . Gynecologic cryosurgery  2007  . Dilation and curettage of uterus      History reviewed. No pertinent family history.  History  Substance Use Topics  . Smoking status: Current Some Day Smoker -- 0.25 packs/day    Types: Cigarettes  . Smokeless tobacco: Never Used  . Alcohol Use: Yes     Comment: social    Allergies: No Known Allergies  No prescriptions prior to admission    Review of Systems  Constitutional: Positive for malaise/fatigue. Negative for fever.  Gastrointestinal: Positive for nausea and abdominal pain. Negative for vomiting.  Genitourinary:       + vaginal bleeding   Physical Exam   Blood pressure 114/63, pulse 87, temperature 98.4 F (36.9 C), temperature source Oral, resp. rate 16, last menstrual period 10/08/2013, SpO2 100.00%.  Physical Exam   Constitutional: She is oriented to person, place, and time. She appears well-developed and well-nourished. No distress.  HENT:  Head: Normocephalic and atraumatic.  Cardiovascular: Normal rate.   Respiratory: Effort normal.  GI: Soft. Bowel sounds are normal. She exhibits no distension and no mass. There is tenderness (mild tenderness to palpation of the lower abdomen bilaterally). There is no rebound and no guarding.  Genitourinary: Cervix exhibits no motion tenderness, no discharge and no friability. There is bleeding (small amount of blood in the vaginal vault) around the vagina. No vaginal discharge found.  Neurological: She is alert and oriented to person, place, and time.  Skin: Skin is warm and dry. No erythema.  Psychiatric: She has a normal mood and affect.   Results for orders placed during the hospital encounter of 11/23/13 (from the past 24 hour(s))  CBC     Status: Abnormal   Collection Time    11/23/13  5:45 PM      Result Value Ref Range   WBC 7.1  4.0 - 10.5 K/uL   RBC 3.67 (*) 3.87 - 5.11 MIL/uL   Hemoglobin 11.5 (*) 12.0 - 15.0 g/dL   HCT 84.633.8 (*) 96.236.0 - 95.246.0 %   MCV 92.1  78.0 - 100.0 fL   MCH 31.3  26.0 - 34.0 pg   MCHC 34.0  30.0 -  36.0 g/dL   RDW 21.3  08.6 - 57.8 %   Platelets 223  150 - 400 K/uL   US Ob Transvaginal  11/23/2013   CLINICAL DATA:  Excessive bleeding. Confirm completion of miscarriage and no retained products of conception.  EXAM: TRANSVAGINAL OB ULTRASOUND  TECHNIQUE: Transvaginal ultrasound was performed for complete evaluation of the gestation as well as the maternal uterus, adnexal regions, and pelvic cul-de-sac.  COMPARISON:  11/09/2013 ultrasound  FINDINGS: There is heterogeneous endometrial thickening measuring 3 cm in thickness and with some areas of internal vascularity by color Doppler. The right ovary was unremarkable, measuring 3.7 x 2.1 x 2.0 cm. The left ovary measured 4.6 x 4.3 x 2.7 cm and contained a 3.9 x 2.1 x 2.9 cm cyst,  decreased in size from the prior study. A small amount of free fluid was visualized in the pelvis.  IMPRESSION: 1. Heterogeneous endometrial thickening with some areas of internal vascularity. Retained products of conception cannot be excluded. 2. Decreased size of left ovarian cyst.   Electronically Signed   By: Sebastian Ache   On: 11/23/2013 21:18    MAU Course  Procedures None  MDM CBC and [place] today Discussed lab and [place] results with Dr. Despina Hidden. Ok for discharge with bleeding precautions.  Assessment and Plan  A: SAB  P: Discharge home Bleeding precautions discussed Rx for Percocet given to patient Patient given work excuse until 11/28/13 Patient to follow-up for repeat labs in [facility] on 11/30/13. Inbasket message sent to [facility] Patient may return to MAU as needed or if her condition were to change or worsen  Freddi Starr, PA-C  11/24/2013, 5:23 AM

## 2013-11-30 ENCOUNTER — Other Ambulatory Visit: Payer: Self-pay

## 2013-11-30 ENCOUNTER — Telehealth: Payer: Self-pay | Admitting: Obstetrics & Gynecology

## 2013-11-30 NOTE — Telephone Encounter (Signed)
Called patient to inform her of missed appointment. Left message for patient to call us back. We still need her to come to this appointment today.

## 2013-12-01 ENCOUNTER — Inpatient Hospital Stay (HOSPITAL_COMMUNITY)
Admission: AD | Admit: 2013-12-01 | Discharge: 2013-12-01 | Disposition: A | Discharge status: Home / Self Care | Payer: Self-pay | Source: Ambulatory Visit | Attending: Obstetrics & Gynecology | Admitting: Obstetrics & Gynecology

## 2013-12-01 DIAGNOSIS — O039 Complete or unspecified spontaneous abortion without complication: Secondary | ICD-10-CM | POA: Insufficient documentation

## 2013-12-01 LAB — HCG, QUANTITATIVE, PREGNANCY: HCG, BETA CHAIN, QUANT, S: 579 m[IU]/mL — AB (ref <5)

## 2013-12-01 MED ORDER — MISOPROSTOL 200 MCG PO TABS: 800.0000 ug | ORAL_TABLET | Freq: Once | ORAL | Status: DC

## 2013-12-01 NOTE — MAU Provider Note (Signed)
  History     CSN: [id number]  Arrival date and time: 12/01/13 2123   None     No chief complaint on file.  HPI  Shelly Silva is a 33 y.o. [id number] who presents today after an SAB. She was seen for heavy bleeding, and the plan was to FU in the clinic today for repeat hcg. She was unable to keep that appointment, and is here now for repeat hcg. She denies any pain or bleeding at this time.   Past Medical History  Diagnosis Date  . History of miscarriage   . Asthma   . EXBMWUXL(244.0Headache(784.0)     Past Surgical History  Procedure Laterality Date  . Dilatation & curettage/hysteroscopy with trueclear  2003, 2006, 2006  . Gynecologic cryosurgery  2007  . Dilation and curettage of uterus      No family history on file.  History  Substance Use Topics  . Smoking status: Current Some Day Smoker -- 0.25 packs/day    Types: Cigarettes  . Smokeless tobacco: Never Used  . Alcohol Use: Yes     Comment: social    Allergies: No Known Allergies  Prescriptions prior to admission  Medication Sig Dispense Refill  . albuterol (PROVENTIL HFA;VENTOLIN HFA) 108 (90 BASE) MCG/ACT inhaler Inhale 2 puffs into the lungs every 6 (six) hours as needed for wheezing.      Marland Kitchen. ibuprofen (ADVIL,MOTRIN) 600 MG tablet Take 1 tablet (600 mg total) by mouth every 6 (six) hours as needed.  30 tablet  1  . metroNIDAZOLE (FLAGYL) 500 MG tablet Take 1 tablet (500 mg total) by mouth 2 (two) times daily.  14 tablet  0  . oxyCODONE-acetaminophen (PERCOCET/ROXICET) 5-325 MG per tablet Take 1-2 tablets by mouth every 4 (four) hours as needed for severe pain.  15 tablet  0    ROS Physical Exam   Last menstrual period 10/08/2013.  Physical Exam  Nursing note and vitals reviewed. Constitutional: She appears well-developed and well-nourished. No distress.  Cardiovascular: Normal rate.   Respiratory: Effort normal.  GI: Soft. There is no tenderness.  Neurological: She is alert.  Skin: Skin is warm and dry.   Psychiatric: She has a normal mood and affect.    MAU Course  Procedures  Results for Shelly ApoWILSON, Zaydee C (MRN [id number]) as of 12/01/2013 22:49  Ref. Range 11/09/2013 13:49 11/09/2013 14:43 11/13/2013 18:15 11/13/2013 19:45 11/16/2013 14:51 11/23/2013 17:45 11/23/2013 20:55 12/01/2013 21:50  hCG, Beta Chain, Quant, S Latest Range: <5 mIU/mL 41571 (H)       579 (H)   Assessment and Plan   1. SAB (spontaneous abortion)    HCG is falling FU in clinic in 2 weeks Return to MAU as needed   Tawnya CrookHogan, Diella Gillingham Donovan 12/01/2013, 10:49 PM

## 2013-12-01 NOTE — MAU Note (Signed)
PT SAYS NO BLEEDING.   STILL HAS PAIN IN LOWER ABD   BUT LESS THAN BEFORE-   PAIN 2.

## 2013-12-01 NOTE — Discharge Instructions (Signed)
Miscarriage A miscarriage is the sudden loss of an unborn baby (fetus) before the 20th week of pregnancy. Most miscarriages happen in the first 3 months of pregnancy. Sometimes, it happens before a woman even knows she is pregnant. A miscarriage is also called a "spontaneous miscarriage" or "early pregnancy loss." Having a miscarriage can be an emotional experience. Talk with your caregiver about any questions you may have about miscarrying, the grieving process, and your future pregnancy plans. CAUSES   Problems with the fetal chromosomes that make it impossible for the baby to develop normally. Problems with the baby's genes or chromosomes are most often the result of errors that occur, by chance, as the embryo divides and grows. The problems are not inherited from the parents.  Infection of the cervix or uterus.   Hormone problems.   Problems with the cervix, such as having an incompetent cervix. This is when the tissue in the cervix is not strong enough to hold the pregnancy.   Problems with the uterus, such as an abnormally shaped uterus, uterine fibroids, or congenital abnormalities.   Certain medical conditions.   Smoking, drinking alcohol, or taking illegal drugs.   Trauma.  Often, the cause of a miscarriage is unknown.  SYMPTOMS   Vaginal bleeding or spotting, with or without cramps or pain.  Pain or cramping in the abdomen or lower back.  Passing fluid, tissue, or blood clots from the vagina. DIAGNOSIS  Your caregiver will perform a physical exam. You may also have an ultrasound to confirm the miscarriage. Blood or urine tests may also be ordered. TREATMENT   Sometimes, treatment is not necessary if you naturally pass all the fetal tissue that was in the uterus. If some of the fetus or placenta remains in the body (incomplete miscarriage), tissue left behind may become infected and must be removed. Usually, a dilation and curettage (D and C) procedure is performed.  During a D and C procedure, the cervix is widened (dilated) and any remaining fetal or placental tissue is gently removed from the uterus.  Antibiotic medicines are prescribed if there is an infection. Other medicines may be given to reduce the size of the uterus (contract) if there is a lot of bleeding.  If you have Rh negative blood and your baby was Rh positive, you will need a Rh immunoglobulin shot. This shot will protect any future baby from having Rh blood problems in future pregnancies. HOME CARE INSTRUCTIONS   Your caregiver may order bed rest or may allow you to continue light activity. Resume activity as directed by your caregiver.  Have someone help with home and family responsibilities during this time.   Keep track of the number of sanitary pads you use each day and how soaked (saturated) they are. Write down this information.   Do not use tampons. Do not douche or have sexual intercourse until approved by your caregiver.   Only take over-the-counter or prescription medicines for pain or discomfort as directed by your caregiver.   Do not take aspirin. Aspirin can cause bleeding.   Keep all follow-up appointments with your caregiver.   If you or your partner have problems with grieving, talk to your caregiver or seek counseling to help cope with the pregnancy loss. Allow enough time to grieve before trying to get pregnant again.  SEEK IMMEDIATE MEDICAL CARE IF:   You have severe cramps or pain in your back or abdomen.  You have a fever.  You pass large blood clots (walnut-sized   or larger) ortissue from your vagina. Save any tissue for your caregiver to inspect.   Your bleeding increases.   You have a thick, bad-smelling vaginal discharge.  You become lightheaded, weak, or you faint.   You have chills.  MAKE SURE YOU:  Understand these instructions.  Will watch your condition.  Will get help right away if you are not doing well or get  worse. Document Released: 02/11/2001 Document Revised: 12/13/2012 Document Reviewed: 10/07/2011 Exit[employer] Patient Information 2014 [employer].  

## 2013-12-02 NOTE — MAU Provider Note (Signed)
Attestation of Attending Supervision of Advanced Practitioner (CNM/NP): Evaluation and management procedures were performed by the Advanced Practitioner under my supervision and collaboration.  I have reviewed the Advanced Practitioner's note and chart, and I agree with the management and plan.  HARRAWAY-SMITH, Ajla Mcgeachy 12:05 AM

## 2013-12-13 ENCOUNTER — Other Ambulatory Visit: Payer: Self-pay

## 2013-12-14 ENCOUNTER — Telehealth: Payer: Self-pay | Admitting: *Deleted

## 2013-12-14 ENCOUNTER — Other Ambulatory Visit: Payer: Self-pay

## 2013-12-14 NOTE — Telephone Encounter (Signed)
Called pt and left message that she missed appt in our office for lab test. This test is important for her ongoing medical care. Please call back to reschedule or if she has any questions.

## 2014-01-06 ENCOUNTER — Inpatient Hospital Stay (HOSPITAL_COMMUNITY)
Admission: AD | Admit: 2014-01-06 | Discharge: 2014-01-06 | Disposition: A | Discharge status: Home / Self Care | Payer: Self-pay | Source: Ambulatory Visit | Attending: Family Medicine | Admitting: Family Medicine

## 2014-01-06 ENCOUNTER — Encounter (HOSPITAL_COMMUNITY): Payer: Self-pay

## 2014-01-06 DIAGNOSIS — Z3202 Encounter for pregnancy test, result negative: Secondary | ICD-10-CM | POA: Insufficient documentation

## 2014-01-06 LAB — HCG, QUANTITATIVE, PREGNANCY: hCG, Beta Chain, Quant, S: <1 m[IU]/mL

## 2014-01-06 NOTE — MAU Note (Signed)
Patient states she has had a positive home pregnancy test and wants to have confirmation. States she had a period on 4-27 stopped 5-2 then started bleeding again on 5-3. No bleeding now, no pain.

## 2014-01-06 NOTE — MAU Provider Note (Signed)
Attestation of Attending Supervision of Advanced Practitioner (PA/CNM/NP): Evaluation and management procedures were performed by the Advanced Practitioner under my supervision and collaboration.  I have reviewed the Advanced Practitioner's note and chart, and I agree with the management and plan.  Advik Weatherspoon S Niesha Bame, MD [facility] Attending 01/06/2014 4:36 PM   

## 2014-01-06 NOTE — MAU Provider Note (Signed)
HPI:  Ms. Katy ApoMarkesha C Hollifield is a 33 y.o. female [phone] who presents to MAU for a pregnancy test. The patient was seen here in April for a pregnancy that ended in an SAB. The patient never made it to her follow up visits. She had a home pregnancy test that was positive and was concerned it was positive from the SAB. She currently denies pain or bleeding.    RN Note: Patient states she has had a positive home pregnancy test and wants to have confirmation. States she had a period on 4-27 stopped 5-2 then started bleeding again on 5-3. No bleeding now, no pain.    Objective:  GENERAL: Well-developed, well-nourished female in no acute distress.  HEENT: Normocephalic, atraumatic.   LUNGS: Effort normal HEART: Regular rate  SKIN: Warm, dry and without erythema PSYCH: Normal mood and affect   Filed Vitals:   01/06/14 1233  BP: 123/69  Pulse: 68  Temp: 98.3 F (36.8 C)  Resp: 16    Results for orders placed during the hospital encounter of 01/06/14 (from the past 48 hour(s))  HCG, QUANTITATIVE, PREGNANCY     Status: None   Collection Time    01/06/14 12:40 PM      Result Value Ref Range   hCG, Beta Chain, Quant, S <1  <5 mIU/mL   Comment:              GEST. AGE      CONC.  (mIU/mL)       <=1 WEEK        5 - 50         2 WEEKS       50 - 500         3 WEEKS       100 - 10,000         4 WEEKS     1,000 - 30,000         5 WEEKS     3,500 - 115,000       6-8 WEEKS     12,000 - 270,000        12 WEEKS     15,000 - 220,000                FEMALE AND NON-PREGNANT FEMALE:         LESS THAN 5 mIU/mL     REPEATED TO VERIFY   MDM: Beta hcg level  Assessment:  Encounter for pregnancy test with results negative  Plan:  Discharge home in stable condition Take an at home pregnancy test following next cycle if pregnancy is of concern  Iona HansenJennifer Irene Rasch, NP 01/06/2014 2:13 PM

## 2014-02-17 ENCOUNTER — Encounter (HOSPITAL_COMMUNITY): Payer: Self-pay

## 2014-02-17 ENCOUNTER — Inpatient Hospital Stay (HOSPITAL_COMMUNITY)
Admission: AD | Admit: 2014-02-17 | Discharge: 2014-02-17 | Disposition: A | Discharge status: Home / Self Care | Payer: Medicaid Other | Source: Ambulatory Visit | Attending: Obstetrics & Gynecology | Admitting: Obstetrics & Gynecology

## 2014-02-17 DIAGNOSIS — Z3200 Encounter for pregnancy test, result unknown: Secondary | ICD-10-CM | POA: Diagnosis present

## 2014-02-17 DIAGNOSIS — Z3201 Encounter for pregnancy test, result positive: Secondary | ICD-10-CM | POA: Diagnosis not present

## 2014-02-17 LAB — URINALYSIS, ROUTINE W REFLEX MICROSCOPIC
Bilirubin Urine: NEGATIVE
Glucose, UA: NEGATIVE mg/dL
Ketones, ur: NEGATIVE mg/dL
LEUKOCYTES UA: NEGATIVE
NITRITE: NEGATIVE
PROTEIN: NEGATIVE mg/dL
Specific Gravity, Urine: 1.015 (ref 1.005–1.030)
UROBILINOGEN UA: 0.2 mg/dL (ref 0.0–1.0)
pH: 5.5 (ref 5.0–8.0)

## 2014-02-17 LAB — URINE MICROSCOPIC-ADD ON

## 2014-02-17 LAB — POCT PREGNANCY, URINE: PREG TEST UR: POSITIVE — AB

## 2014-02-17 NOTE — MAU Note (Signed)
Patient she had a positive home pregnancy test. Has pain with urination for about 3 days. Denies bleeding or discharge.

## 2014-02-17 NOTE — MAU Provider Note (Signed)
S: 33 y.o. [id number] @[redacted]w[redacted]d  by LMP presents to MAU for pregnancy confirmation.  She denies abdominal pain or vaginal bleeding.   O: BP 119/57  Pulse 62  Temp(Src) 99.5 F (37.5 C) (Oral)  Resp 16  Ht 5\' 4"  (1.626 m)  Wt 64.955 kg (143 lb 3.2 oz)  BMI 24.57 kg/m2  SpO2 100%  LMP 01/19/2014  Results for orders placed during the hospital encounter of 02/17/14 (from the past 24 hour(s))  URINALYSIS, ROUTINE W REFLEX MICROSCOPIC     Status: Abnormal   Collection Time    02/17/14 12:35 PM      Result Value Ref Range   Color, Urine YELLOW  YELLOW   APPearance CLEAR  CLEAR   Specific Gravity, Urine 1.015  1.005 - 1.030   pH 5.5  5.0 - 8.0   Glucose, UA NEGATIVE  NEGATIVE mg/dL   Hgb urine dipstick SMALL (*) NEGATIVE   Bilirubin Urine NEGATIVE  NEGATIVE   Ketones, ur NEGATIVE  NEGATIVE mg/dL   Protein, ur NEGATIVE  NEGATIVE mg/dL   Urobilinogen, UA 0.2  0.0 - 1.0 mg/dL   Nitrite NEGATIVE  NEGATIVE   Leukocytes, UA NEGATIVE  NEGATIVE  URINE MICROSCOPIC-ADD ON     Status: Abnormal   Collection Time    02/17/14 12:35 PM      Result Value Ref Range   Squamous Epithelial / LPF FEW (*) RARE   WBC, UA 0-2  <3 WBC/hpf   RBC / HPF 0-2  <3 RBC/hpf   Bacteria, UA RARE  RARE  POCT PREGNANCY, URINE     Status: Abnormal   Collection Time    02/17/14  1:01 PM      Result Value Ref Range   Preg Test, Ur POSITIVE (*) NEGATIVE   A: Positive pregnancy test  P: D/C home Pregnancy verification letter given Start prenatal care as soon as possible Return to MAU as needed for emergencies  Sharen CounterLisa Leftwich-Kirby Certified Nurse-Midwife

## 2014-02-24 ENCOUNTER — Encounter: Payer: Self-pay | Admitting: Family

## 2014-02-26 ENCOUNTER — Inpatient Hospital Stay (HOSPITAL_COMMUNITY)
Admission: AD | Admit: 2014-02-26 | Discharge: 2014-02-26 | Disposition: A | Discharge status: Home / Self Care | Payer: Medicaid Other | Source: Ambulatory Visit | Attending: Obstetrics & Gynecology | Admitting: Obstetrics & Gynecology

## 2014-02-26 ENCOUNTER — Inpatient Hospital Stay (HOSPITAL_COMMUNITY): Payer: Medicaid Other

## 2014-02-26 ENCOUNTER — Encounter (HOSPITAL_COMMUNITY): Payer: Self-pay | Admitting: *Deleted

## 2014-02-26 DIAGNOSIS — O99330 Smoking (tobacco) complicating pregnancy, unspecified trimester: Secondary | ICD-10-CM | POA: Diagnosis not present

## 2014-02-26 DIAGNOSIS — O2390 Unspecified genitourinary tract infection in pregnancy, unspecified trimester: Secondary | ICD-10-CM | POA: Insufficient documentation

## 2014-02-26 DIAGNOSIS — R109 Unspecified abdominal pain: Secondary | ICD-10-CM | POA: Insufficient documentation

## 2014-02-26 DIAGNOSIS — O26899 Other specified pregnancy related conditions, unspecified trimester: Secondary | ICD-10-CM

## 2014-02-26 DIAGNOSIS — O9989 Other specified diseases and conditions complicating pregnancy, childbirth and the puerperium: Secondary | ICD-10-CM

## 2014-02-26 DIAGNOSIS — O2341 Unspecified infection of urinary tract in pregnancy, first trimester: Secondary | ICD-10-CM

## 2014-02-26 DIAGNOSIS — O99891 Other specified diseases and conditions complicating pregnancy: Secondary | ICD-10-CM | POA: Insufficient documentation

## 2014-02-26 DIAGNOSIS — N390 Urinary tract infection, site not specified: Secondary | ICD-10-CM | POA: Diagnosis not present

## 2014-02-26 LAB — URINALYSIS, ROUTINE W REFLEX MICROSCOPIC
Bilirubin Urine: NEGATIVE
Glucose, UA: NEGATIVE mg/dL
Ketones, ur: NEGATIVE mg/dL
NITRITE: POSITIVE — AB
Protein, ur: NEGATIVE mg/dL
UROBILINOGEN UA: 1 mg/dL (ref 0.0–1.0)
pH: 6.5 (ref 5.0–8.0)

## 2014-02-26 LAB — HCG, QUANTITATIVE, PREGNANCY: hCG, Beta Chain, Quant, S: 3759 m[IU]/mL — ABNORMAL HIGH (ref <5)

## 2014-02-26 LAB — WET PREP, GENITAL
CLUE CELLS WET PREP: NONE SEEN
TRICH WET PREP: NONE SEEN
Yeast Wet Prep HPF POC: NONE SEEN

## 2014-02-26 LAB — CBC
HCT: 36.7 % (ref 36.0–46.0)
Hemoglobin: 12.6 g/dL (ref 12.0–15.0)
MCH: 30.7 pg (ref 26.0–34.0)
MCHC: 34.3 g/dL (ref 30.0–36.0)
MCV: 89.3 fL (ref 78.0–100.0)
Platelets: 206 10*3/uL (ref 150–400)
RBC: 4.11 MIL/uL (ref 3.87–5.11)
RDW: 12.8 % (ref 11.5–15.5)
WBC: 14.2 10*3/uL — ABNORMAL HIGH (ref 4.0–10.5)

## 2014-02-26 LAB — URINE MICROSCOPIC-ADD ON

## 2014-02-26 MED ORDER — ACETAMINOPHEN 500 MG PO TABS
500.0000 mg | ORAL_TABLET | Freq: Once | ORAL | Status: AC
  Administered 2014-02-26: 500 mg via ORAL
  Filled 2014-02-26: qty 1

## 2014-02-26 MED ORDER — ONDANSETRON 4 MG PO TBDP
4.0000 mg | ORAL_TABLET | Freq: Once | ORAL | Status: AC
  Administered 2014-02-26: 4 mg via ORAL
  Filled 2014-02-26: qty 1

## 2014-02-26 MED ORDER — CEPHALEXIN 500 MG PO CAPS
500.0000 mg | ORAL_CAPSULE | Freq: Once | ORAL | Status: AC
  Administered 2014-02-26: 500 mg via ORAL
  Filled 2014-02-26: qty 1

## 2014-02-26 MED ORDER — CEPHALEXIN 500 MG PO CAPS: 500.0000 mg | ORAL_CAPSULE | Freq: Three times a day (TID) | ORAL | Status: DC

## 2014-02-26 NOTE — MAU Provider Note (Signed)
History     CSN: [id number]  Arrival date and time: 02/26/14 1648   First Provider Initiated Contact with Patient 02/26/14 1956      Chief Complaint  Patient presents with  . Abdominal Pain   Abdominal Pain Associated symptoms include dysuria and nausea. Pertinent negatives include no fever, frequency or vomiting.    Pt is a 33 yo G10P4145 at 6137w3d wks IUP here with report of increasing pelvic pain over past week.  Pt reports constant midpelvic pain rated an 8/10.  Denies vaginal bleeding.  +nausea.  Diagnosed with UTI in early pregnancy, however did not take the medication.    Past Medical History  Diagnosis Date  . History of miscarriage   . Asthma   . WUJWJXBJ(478.2Headache(784.0)     Past Surgical History  Procedure Laterality Date  . Dilatation & curettage/hysteroscopy with trueclear  2003, 2006, 2006  . Gynecologic cryosurgery  2007  . Dilation and curettage of uterus      History reviewed. No pertinent family history.  History  Substance Use Topics  . Smoking status: Current Some Day Smoker -- 0.25 packs/day    Types: Cigarettes  . Smokeless tobacco: Never Used  . Alcohol Use: Yes     Comment: social    Allergies: No Known Allergies  Prescriptions prior to admission  Medication Sig Dispense Refill  . acetaminophen (TYLENOL) 325 MG tablet Take 650 mg by mouth every 6 (six) hours as needed for mild pain.      Marland Kitchen. albuterol (PROVENTIL HFA;VENTOLIN HFA) 108 (90 BASE) MCG/ACT inhaler Inhale 2 puffs into the lungs every 6 (six) hours as needed for wheezing.        Review of Systems  Constitutional: Negative for fever and chills.  Gastrointestinal: Positive for nausea and abdominal pain. Negative for vomiting.  Genitourinary: Positive for dysuria and urgency. Negative for frequency.  All other systems reviewed and are negative.  Physical Exam   Blood pressure 114/65, pulse 114, temperature 100.1 F (37.8 C), resp. rate 18, last menstrual period 01/19/2014, unknown if  currently breastfeeding.  Physical Exam  Constitutional: She is oriented to person, place, and time. She appears well-developed and well-nourished. No distress.  Appears uncomfortable  HENT:  Head: Normocephalic.  Neck: Normal range of motion. Neck supple.  Cardiovascular: Normal rate, regular rhythm and normal heart sounds.   Respiratory: Effort normal and breath sounds normal. No respiratory distress.  GI: Soft. There is tenderness (midpelvic).  Genitourinary: Uterus is enlarged. Right adnexum displays no mass, no tenderness and no fullness. Left adnexum displays no mass, no tenderness and no fullness. No bleeding around the vagina. Vaginal discharge ( thin white) found.  Musculoskeletal: Normal range of motion.  Neurological: She is alert and oriented to person, place, and time.  Skin: Skin is warm and dry.    MAU Course  Procedures Results for orders placed during the hospital encounter of 02/26/14 (from the past 24 hour(s))  URINALYSIS, ROUTINE W REFLEX MICROSCOPIC     Status: Abnormal   Collection Time    02/26/14  5:30 PM      Result Value Ref Range   Color, Urine YELLOW  YELLOW   APPearance CLEAR  CLEAR   Specific Gravity, Urine <1.005 (*) 1.005 - 1.030   pH 6.5  5.0 - 8.0   Glucose, UA NEGATIVE  NEGATIVE mg/dL   Hgb urine dipstick MODERATE (*) NEGATIVE   Bilirubin Urine NEGATIVE  NEGATIVE   Ketones, ur NEGATIVE  NEGATIVE mg/dL   Protein,  ur NEGATIVE  NEGATIVE mg/dL   Urobilinogen, UA 1.0  0.0 - 1.0 mg/dL   Nitrite POSITIVE (*) NEGATIVE   Leukocytes, UA MODERATE (*) NEGATIVE  URINE MICROSCOPIC-ADD ON     Status: Abnormal   Collection Time    02/26/14  5:30 PM      Result Value Ref Range   Squamous Epithelial / LPF FEW (*) RARE   WBC, UA 7-10  <3 WBC/hpf   RBC / HPF 0-2  <3 RBC/hpf   Bacteria, UA MANY (*) RARE   Urine-Other MUCOUS PRESENT    CBC     Status: Abnormal   Collection Time    02/26/14  6:25 PM      Result Value Ref Range   WBC 14.2 (*) 4.0 - 10.5  K/uL   RBC 4.11  3.87 - 5.11 MIL/uL   Hemoglobin 12.6  12.0 - 15.0 g/dL   HCT 45.436.7  09.836.0 - 11.946.0 %   MCV 89.3  78.0 - 100.0 fL   MCH 30.7  26.0 - 34.0 pg   MCHC 34.3  30.0 - 36.0 g/dL   RDW 14.712.8  82.911.5 - 56.215.5 %   Platelets 206  150 - 400 K/uL  HCG, QUANTITATIVE, PREGNANCY     Status: Abnormal   Collection Time    02/26/14  6:25 PM      Result Value Ref Range   hCG, Beta Chain, Quant, S 3759 (*) <5 mIU/mL  WET PREP, GENITAL     Status: Abnormal   Collection Time    02/26/14  7:56 PM      Result Value Ref Range   Yeast Wet Prep HPF POC NONE SEEN  NONE SEEN   Trich, Wet Prep NONE SEEN  NONE SEEN   Clue Cells Wet Prep HPF POC NONE SEEN  NONE SEEN   WBC, Wet Prep HPF POC RARE (*) NONE SEEN   Ultrasound: FINDINGS:  Intrauterine gestational sac: Small, oval fluid collection in the  central uterus towards the fundus with an appearance compatible with  a gestational sac although without a prominent surrounding decidual  reaction.  Yolk sac: Not visualized  Embryo: Not visualize  Cardiac Activity: Not visualized  MSD: 6.6 mm mm 5 w 2 d  Maternal uterus/adnexae: Uterus otherwise unremarkable. Ovaries  unremarkable. Small amount of pelvic free fluid noted.  IMPRESSION:  Probable early intrauterine gestational sac, but no yolk sac, fetal  pole, or cardiac activity yet visualized. Recommend follow-up  quantitative B-HCG levels and follow-up US in 14 days to confirm and  assess viability.  Pt given acetaminophen 500 mg for pain > desires to go home after adminstration.   Assessment and Plan  33 yo [id number] at 7841w3d wks IUP Abdominal Pain in Pregnancy UTI in Pregnancy  Plan: Discharge to home RX Keflex 500 mg TID x 7 days. Repeat BHCG in 48 hours Ectopic precautions  [facility],Kein Carlberg 02/26/2014, 10:02 PM

## 2014-02-26 NOTE — MAU Note (Signed)
Pt presents to MAU with complaints of pain in her lower abdomen. She was evaluated for a UTI  A couple of weeks ago and was not treated

## 2014-02-26 NOTE — Discharge Instructions (Signed)

## 2014-02-27 LAB — GC/CHLAMYDIA PROBE AMP
CT PROBE, AMP APTIMA: NEGATIVE
GC Probe RNA: NEGATIVE

## 2014-02-27 NOTE — MAU Provider Note (Signed)
Attestation of Attending Supervision of Advanced Practitioner (CNM/NP): Evaluation and management procedures were performed by the Advanced Practitioner under my supervision and collaboration.  I have reviewed the Advanced Practitioner's note and chart, and I agree with the management and plan.  HARRAWAY-SMITH, CAROLYN 9:01 AM

## 2014-02-28 ENCOUNTER — Inpatient Hospital Stay (HOSPITAL_COMMUNITY)
Admission: AD | Admit: 2014-02-28 | Discharge: 2014-02-28 | Disposition: A | Discharge status: Home / Self Care | Payer: Medicaid Other | Source: Ambulatory Visit | Attending: Obstetrics & Gynecology | Admitting: Obstetrics & Gynecology

## 2014-02-28 DIAGNOSIS — O26899 Other specified pregnancy related conditions, unspecified trimester: Secondary | ICD-10-CM

## 2014-02-28 DIAGNOSIS — N390 Urinary tract infection, site not specified: Secondary | ICD-10-CM | POA: Diagnosis not present

## 2014-02-28 DIAGNOSIS — O2390 Unspecified genitourinary tract infection in pregnancy, unspecified trimester: Secondary | ICD-10-CM | POA: Insufficient documentation

## 2014-02-28 DIAGNOSIS — R109 Unspecified abdominal pain: Secondary | ICD-10-CM

## 2014-02-28 DIAGNOSIS — O99330 Smoking (tobacco) complicating pregnancy, unspecified trimester: Secondary | ICD-10-CM | POA: Diagnosis not present

## 2014-02-28 DIAGNOSIS — N949 Unspecified condition associated with female genital organs and menstrual cycle: Secondary | ICD-10-CM | POA: Insufficient documentation

## 2014-02-28 LAB — HCG, QUANTITATIVE, PREGNANCY: hCG, Beta Chain, Quant, S: 4864 m[IU]/mL — ABNORMAL HIGH (ref <5)

## 2014-02-28 NOTE — MAU Note (Signed)
occ twinges of of pain, no abd pain right now, better since started pills.  Denies any bleeding.  Has a HA, started when here on Sunday- but is not typical; no response to Tylenol.

## 2014-02-28 NOTE — MAU Provider Note (Signed)
History     CSN: [id number]  Arrival date and time: 02/28/14 1459   None     Chief Complaint  Patient presents with  . Follow-up   HPI  Pt is 3669w5d pregnant and returns for f/u Beta HCG.  Pt was initially seen on 02/24/2014 with c/o pelvic pain and positive pregnancy test. At that time,and ultrasound showed ?GSw/ decidual reaction- no embryo (see ultrasound report below) Pt was noted to have a UTI and was treated with Keflex, feeling no pain or bleeding at this time.    Past Medical History  Diagnosis Date  . History of miscarriage   . Asthma   . WUJWJXBJ(478.2Headache(784.0)     Past Surgical History  Procedure Laterality Date  . Dilatation & curettage/hysteroscopy with trueclear  2003, 2006, 2006  . Gynecologic cryosurgery  2007  . Dilation and curettage of uterus      No family history on file.  History  Substance Use Topics  . Smoking status: Current Some Day Smoker -- 0.25 packs/day    Types: Cigarettes  . Smokeless tobacco: Never Used  . Alcohol Use: Yes     Comment: social    Allergies: No Known Allergies  Prescriptions prior to admission  Medication Sig Dispense Refill  . acetaminophen (TYLENOL) 325 MG tablet Take 650 mg by mouth every 6 (six) hours as needed for mild pain.      Marland Kitchen. albuterol (PROVENTIL HFA;VENTOLIN HFA) 108 (90 BASE) MCG/ACT inhaler Inhale 2 puffs into the lungs every 6 (six) hours as needed for wheezing.      . cephALEXin (KEFLEX) 500 MG capsule Take 1 capsule (500 mg total) by mouth 3 (three) times daily.  21 capsule  0    Review of Systems  Constitutional: Negative for fever and chills.  Gastrointestinal: Negative for nausea, vomiting, abdominal pain, diarrhea and constipation.  Genitourinary: Negative for dysuria.   Physical Exam   Blood pressure 130/73, pulse 102, temperature 98.3 F (36.8 C), temperature source Oral, resp. rate 18, last menstrual period 01/19/2014, unknown if currently breastfeeding.  Physical Exam  Nursing note and vitals  reviewed. Constitutional: She is oriented to person, place, and time. She appears well-developed and well-nourished. No distress.  HENT:  Head: Normocephalic.  Eyes: Pupils are equal, round, and reactive to light.  Neck: Normal range of motion. Neck supple.  Cardiovascular: Normal rate.   Respiratory: Effort normal.  GI: Soft. Bowel sounds are normal.  Musculoskeletal: Normal range of motion.  Neurological: She is alert and oriented to person, place, and time.  Skin: Skin is warm and dry.  Psychiatric: She has a normal mood and affect.    MAU Course  Procedures CLINICAL DATA: Increasing pelvic pain since 02/10/2014.  EXAM:  OBSTETRIC <14 WK US AND TRANSVAGINAL OB US  TECHNIQUE:  Both transabdominal and transvaginal ultrasound examinations were  performed for complete evaluation of the gestation as well as the  maternal uterus, adnexal regions, and pelvic cul-de-sac.  Transvaginal technique was performed to assess early pregnancy.  COMPARISON: 11/23/2013 pelvic ultrasound  FINDINGS:  Intrauterine gestational sac: Small, oval fluid collection in the  central uterus towards the fundus with an appearance compatible with  a gestational sac although without a prominent surrounding decidual  reaction.  Yolk sac: Not visualized  Embryo: Not visualize  Cardiac Activity: Not visualized  MSD: 6.6 mm mm 5 w 2 d  Maternal uterus/adnexae: Uterus otherwise unremarkable. Ovaries  unremarkable. Small amount of pelvic free fluid noted.  IMPRESSION:  Probable early  intrauterine gestational sac, but no yolk sac, fetal  pole, or cardiac activity yet visualized. Recommend follow-up  quantitative B-HCG levels and follow-up US in 14 days to confirm and  assess viability. This recommendation follows SRU consensus  guidelines: Diagnostic Criteria for Nonviable Pregnancy Early in the  First Trimester. Malva Limes Engl J Med 2013; 161:0960-45; 369:1443-51.  Electronically Signed  By: Sebastian AcheAllen Grady 02/26/2014  Results for  Shelly Silva, Shelly C (MRN [id number]) as of 02/28/2014 16:32  Ref. Range 02/26/2014 18:25 02/26/2014 19:56 02/26/2014 21:55 02/28/2014 15:04  hCG, Beta Chain, Quant, S Latest Range: <5 mIU/mL 3759 (H)   4864 (H)   Discussed with dr. Marice Potterove- will have pt return in 48 hours since HCG not doubled Pt to return sooner if increase in pain or bleeding  Assessment and Plan  Pelvic pain in pregnancy with slow rising HCG F/u Fri morning 03/03/2014 for repeat HCG Ectopic precautions UTI- continue antibiotics LINEBERRY,SUSAN 02/28/2014, 4:31 PM

## 2014-02-28 NOTE — Discharge Instructions (Signed)

## 2014-03-01 LAB — URINE CULTURE: Colony Count: >=100000

## 2014-03-03 ENCOUNTER — Inpatient Hospital Stay (HOSPITAL_COMMUNITY): Payer: Medicaid Other

## 2014-03-03 ENCOUNTER — Inpatient Hospital Stay (HOSPITAL_COMMUNITY)
Admission: AD | Admit: 2014-03-03 | Discharge: 2014-03-03 | Disposition: A | Discharge status: Home / Self Care | Payer: Medicaid Other | Source: Ambulatory Visit | Attending: Obstetrics and Gynecology | Admitting: Obstetrics and Gynecology

## 2014-03-03 DIAGNOSIS — O9989 Other specified diseases and conditions complicating pregnancy, childbirth and the puerperium: Principal | ICD-10-CM

## 2014-03-03 DIAGNOSIS — O99891 Other specified diseases and conditions complicating pregnancy: Secondary | ICD-10-CM | POA: Diagnosis not present

## 2014-03-03 LAB — HCG, QUANTITATIVE, PREGNANCY: HCG, BETA CHAIN, QUANT, S: 6886 m[IU]/mL — AB (ref <5)

## 2014-03-03 NOTE — MAU Provider Note (Signed)
S: 33 y.o. [id number] @[redacted]w[redacted]d  by LMP presents to MAU for repeat quant hcg.  She was seen on 02/26/14 with abdominal pain in pregnancy and had quant hcg of 3759.  On 02/28/14 her quant was slowly rising to 4864 but no IUP visualized on ultrasound.  She denies pain, vaginal bleeding, n/v, dizziness, or fever/chills today.    O: BP 117/65  Pulse 75  Temp(Src) 98.4 F (36.9 C) (Oral)  Resp 18  Ht 5\' 3"  (1.6 m)  Wt 64.411 kg (142 lb)  BMI 25.16 kg/m2  LMP 01/19/2014  Results for orders placed during the hospital encounter of 03/03/14 (from the past 168 hour(s))  HCG, QUANTITATIVE, PREGNANCY   Collection Time    03/03/14  4:47 PM      Result Value Ref Range   hCG, Beta Chain, Quant, S 6886 (*) <5 mIU/mL  Results for orders placed during the hospital encounter of 02/28/14 (from the past 168 hour(s))  HCG, QUANTITATIVE, PREGNANCY   Collection Time    02/28/14  3:04 PM      Result Value Ref Range   hCG, Beta Chain, Quant, S 4864 (*) <5 mIU/mL  HCG, QUANTITATIVE, PREGNANCY   Collection Time    02/26/14  6:25 PM      Result Value Ref Range   hCG, Beta Chain, Quant, S 3759 (*) <5 mIU/mL   [place] Ob Transvaginal  03/03/2014   CLINICAL DATA:  Follow-up viability, inappropriate rise in beta HCG  EXAM: TRANSVAGINAL OB ULTRASOUND  TECHNIQUE: Transvaginal ultrasound was performed for complete evaluation of the gestation as well as the maternal uterus, adnexal regions, and pelvic cul-de-sac.  COMPARISON:  02/26/2014  FINDINGS: Intrauterine gestational sac: Visualized/normal in shape.  Yolk sac:  Present  Embryo:  Present  Cardiac Activity: Present  Heart Rate: 162 bpm  CRL:   4.0  mm   6 w 1 d                  US EDC: 10/26/2014  Maternal uterus/adnexae: No subchorionic hemorrhage.  Right ovary is within normal limits, measuring 5.4 x 3.0 x 2.8 cm, and is notable for a hemorrhagic corpus luteal cyst.  Left ovary is within normal limits, measuring 3.0 x 2.1 x 2.3 cm, and is notable for a simple cyst.  Small volume  pelvic ascites, simple.  IMPRESSION: Single live intrauterine gestation with estimated gestational age [redacted] weeks 1 day by crown-rump length.   Electronically Signed   By: Charline BillsSriyesh  Krishnan M.D.   On: 03/03/2014 19:09    A: IUP on ultrasound  P: D/C home  F/U with prenatal care as scheduled Return to MAU as needed  Sharen CounterLisa Leftwich-Kirby Certified Nurse-Midwife

## 2014-03-03 NOTE — MAU Note (Signed)
Order per last visit note entered

## 2014-03-03 NOTE — MAU Note (Addendum)
Feels wonderful. No pain or bleeding. Feel like a million bucks.  Antibiotic is giving her diarrhea, but other than that, she is fine.

## 2014-03-03 NOTE — MAU Note (Signed)
Sharen CounterLisa Leftwich-Kirby CNM in Triage to discuss test results and d/c plan with pt. Pt d/c from Triage

## 2014-03-05 NOTE — MAU Provider Note (Signed)
Attestation of Attending Supervision of Advanced Practitioner (CNM/NP): Evaluation and management procedures were performed by the Advanced Practitioner under my supervision and collaboration.  I have reviewed the Advanced Practitioner's note and chart, and I agree with the management and plan.  Vera Wishart 03/05/2014 8:05 PM

## 2014-04-05 ENCOUNTER — Encounter: Payer: Self-pay | Admitting: Family

## 2014-04-05 ENCOUNTER — Ambulatory Visit (INDEPENDENT_AMBULATORY_CARE_PROVIDER_SITE_OTHER): Payer: Medicaid Other | Admitting: Family

## 2014-04-05 VITALS — BP 106/56 | HR 84 | Temp 98.8°F | Wt 144.5 lb

## 2014-04-05 DIAGNOSIS — O09211 Supervision of pregnancy with history of pre-term labor, first trimester: Secondary | ICD-10-CM

## 2014-04-05 DIAGNOSIS — Z3480 Encounter for supervision of other normal pregnancy, unspecified trimester: Secondary | ICD-10-CM

## 2014-04-05 DIAGNOSIS — O09891 Supervision of other high risk pregnancies, first trimester: Secondary | ICD-10-CM

## 2014-04-05 DIAGNOSIS — O09899 Supervision of other high risk pregnancies, unspecified trimester: Secondary | ICD-10-CM | POA: Insufficient documentation

## 2014-04-05 DIAGNOSIS — O09219 Supervision of pregnancy with history of pre-term labor, unspecified trimester: Secondary | ICD-10-CM

## 2014-04-05 DIAGNOSIS — Z3491 Encounter for supervision of normal pregnancy, unspecified, first trimester: Secondary | ICD-10-CM

## 2014-04-05 DIAGNOSIS — Z3481 Encounter for supervision of other normal pregnancy, first trimester: Secondary | ICD-10-CM

## 2014-04-05 LAB — POCT URINALYSIS DIP (DEVICE)
BILIRUBIN URINE: NEGATIVE
GLUCOSE, UA: NEGATIVE mg/dL
KETONES UR: NEGATIVE mg/dL
Leukocytes, UA: NEGATIVE
Nitrite: NEGATIVE
Protein, ur: NEGATIVE mg/dL
SPECIFIC GRAVITY, URINE: 1.015 (ref 1.005–1.030)
Urobilinogen, UA: 0.2 mg/dL (ref 0.0–1.0)
pH: 7 (ref 5.0–8.0)

## 2014-04-05 NOTE — Progress Notes (Signed)
Subjective:    Shelly Silva is a W09W1191 [redacted]w[redacted]d being seen today for her first obstetrical visit.  . Patient does not intend to breast feed. Pregnancy history fully reviewed.  Patient reports backache, nausea and vomiting.  Filed Vitals:   04/05/14 1452  BP: 106/56  Pulse: 84  Temp: 98.8 F (37.1 C)  Weight: 144 lb 8 oz (65.545 kg)    HISTORY: OB History  Gravida Para Term Preterm AB SAB TAB Ectopic Multiple Living  10 5 4 1 4 4    5     # Outcome Date GA Lbr Len/2nd Weight Sex Delivery Anes PTL Lv  10 CUR           9 SAB 11/13/13 [redacted]w[redacted]d         8 TRM 11/24/09    M SVD EPI N Y  7 TRM 02/01/07    F SVD EPI N Y  6 TRM 10/09/05    M SVD EPI N Y  5 SAB 06/2005             Comments: had D&C  4 SAB 2006             Comments: had D & C  3 TRM 10/16/02    F SVD EPI N Y  2 SAB 2003        N     Comments: had D & C  1 PRE 10/03/00    F SVD None Y Y     Past Medical History  Diagnosis Date   History of miscarriage    Asthma    Headache(784.0)    Past Surgical History  Procedure Laterality Date   Dilatation & curettage/hysteroscopy with trueclear  2003, 2006, 2006   Gynecologic cryosurgery  2007   Dilation and curettage of uterus     History reviewed. No pertinent family history.   Exam    Uterus:   gravid  Pelvic Exam:    Perineum: Normal Perineum   Vulva: normal   Vagina:  normal discharge   Cervix: no bleeding following Pap   Adnexa: normal adnexa   Bony Pelvis: average  System: Breast:  normal appearance, no masses or tenderness   Skin: normal coloration and turgor, no rashes    Neurologic: oriented   Extremities: no deformities   HEENT extra ocular movement intact   Mouth/Teeth mucous membranes moist, pharynx normal without lesions and dental hygiene good   Neck supple   Cardiovascular: regular rate and rhythm   Respiratory:  appears well, vitals normal, no respiratory distress, acyanotic, normal RR, ear and throat exam is normal, neck free  of mass or lymphadenopathy, chest clear, no wheezing, crepitations, rhonchi, normal symmetric air entry   Abdomen: soft, non-tender; bowel sounds normal; no masses,  no organomegaly   Urinary: urethral meatus normal      Assessment:    Pregnancy:  33 yo  [id number] at [redacted]w[redacted]d H/O 1 preterm delivery at 32 weeks followed by 4 term deliveries      Plan:    First baby born at 32 weeks. 17P offered and information given.  Pt expects she would like to receive this.  She is going to consider and inform us of her decision at next appt in 4 weeks (when she is 14 weeks gest).   Initial labs drawn. Prenatal vitamins. Problem list reviewed and updated. Genetic Screening discussed First Screen: requested.  Ultrasound discussed; fetal survey: requested.  Follow up in 4 weeks.   Clydie Braun  E Teague Clark 04/05/2014

## 2014-04-05 NOTE — Progress Notes (Signed)
Discussed appropriate weight gain (25-35lb); pt. Verbalized understanding.  New OB labs and pap today.  New OB packet given.

## 2014-04-06 LAB — OBSTETRIC PANEL
Antibody Screen: NEGATIVE
Basophils Absolute: 0 10*3/uL (ref 0.0–0.1)
Basophils Relative: 0 % (ref 0–1)
Eosinophils Absolute: 0.1 10*3/uL (ref 0.0–0.7)
Eosinophils Relative: 2 % (ref 0–5)
HCT: 35.4 % — ABNORMAL LOW (ref 36.0–46.0)
HEP B S AG: NEGATIVE
Hemoglobin: 12.1 g/dL (ref 12.0–15.0)
LYMPHS ABS: 1.9 10*3/uL (ref 0.7–4.0)
LYMPHS PCT: 36 % (ref 12–46)
MCH: 29.8 pg (ref 26.0–34.0)
MCHC: 34.2 g/dL (ref 30.0–36.0)
MCV: 87.2 fL (ref 78.0–100.0)
Monocytes Absolute: 0.5 10*3/uL (ref 0.1–1.0)
Monocytes Relative: 9 % (ref 3–12)
Neutro Abs: 2.9 10*3/uL (ref 1.7–7.7)
Neutrophils Relative %: 53 % (ref 43–77)
Platelets: 256 10*3/uL (ref 150–400)
RBC: 4.06 MIL/uL (ref 3.87–5.11)
RDW: 14.8 % (ref 11.5–15.5)
Rh Type: POSITIVE
Rubella: 10.4 Index — ABNORMAL HIGH (ref <0.90)
WBC: 5.4 10*3/uL (ref 4.0–10.5)

## 2014-04-06 LAB — HIV ANTIBODY (ROUTINE TESTING W REFLEX): HIV 1&2 Ab, 4th Generation: NONREACTIVE

## 2014-04-07 LAB — PRESCRIPTION MONITORING PROFILE (19 PANEL)
Amphetamine/Meth: NEGATIVE ng/mL
BARBITURATE SCREEN, URINE: NEGATIVE ng/mL
BENZODIAZEPINE SCREEN, URINE: NEGATIVE ng/mL
BUPRENORPHINE, URINE: NEGATIVE ng/mL
Cannabinoid Scrn, Ur: NEGATIVE ng/mL
Carisoprodol, Urine: NEGATIVE ng/mL
Cocaine Metabolites: NEGATIVE ng/mL
Creatinine, Urine: 89.59 mg/dL (ref >20.0)
ECSTASY: NEGATIVE ng/mL
Fentanyl, Ur: NEGATIVE ng/mL
Meperidine, Ur: NEGATIVE ng/mL
Methadone Screen, Urine: NEGATIVE ng/mL
Methaqualone: NEGATIVE ng/mL
NITRITES URINE, INITIAL: NEGATIVE ug/mL
OPIATE SCREEN, URINE: NEGATIVE ng/mL
Oxycodone Screen, Ur: NEGATIVE ng/mL
PROPOXYPHENE: NEGATIVE ng/mL
Phencyclidine, Ur: NEGATIVE ng/mL
Tapentadol, urine: NEGATIVE ng/mL
Tramadol Scrn, Ur: NEGATIVE ng/mL
ZOLPIDEM, URINE: NEGATIVE ng/mL
pH, Initial: 7.3 pH (ref 4.5–8.9)

## 2014-04-07 LAB — CULTURE, OB URINE: Colony Count: 80000

## 2014-04-07 LAB — CYTOLOGY - PAP

## 2014-04-07 LAB — HEMOGLOBINOPATHY EVALUATION
HGB A: 97.3 % (ref 96.8–97.8)
HGB F QUANT: 0 % (ref 0.0–2.0)
HGB S QUANTITAION: 0 %
Hemoglobin Other: 0 %
Hgb A2 Quant: 2.7 % (ref 2.2–3.2)

## 2014-04-09 ENCOUNTER — Encounter: Payer: Self-pay | Admitting: Family

## 2014-04-18 ENCOUNTER — Encounter (HOSPITAL_COMMUNITY): Payer: Self-pay

## 2014-04-18 ENCOUNTER — Other Ambulatory Visit: Payer: Self-pay

## 2014-04-18 ENCOUNTER — Ambulatory Visit (HOSPITAL_COMMUNITY)
Admission: RE | Admit: 2014-04-18 | Discharge: 2014-04-18 | Disposition: A | Discharge status: Home / Self Care | Payer: Medicaid Other | Source: Ambulatory Visit | Attending: Family | Admitting: Family

## 2014-04-18 VITALS — BP 95/56 | HR 61 | Wt 147.0 lb

## 2014-04-18 DIAGNOSIS — Z3689 Encounter for other specified antenatal screening: Secondary | ICD-10-CM | POA: Diagnosis not present

## 2014-04-18 DIAGNOSIS — Z36 Encounter for antenatal screening of mother: Secondary | ICD-10-CM

## 2014-04-18 DIAGNOSIS — Z3491 Encounter for supervision of normal pregnancy, unspecified, first trimester: Secondary | ICD-10-CM

## 2014-04-25 DIAGNOSIS — Z369 Encounter for antenatal screening, unspecified: Secondary | ICD-10-CM

## 2014-04-26 ENCOUNTER — Encounter: Payer: Self-pay | Admitting: *Deleted

## 2014-05-03 ENCOUNTER — Ambulatory Visit (INDEPENDENT_AMBULATORY_CARE_PROVIDER_SITE_OTHER): Payer: Medicaid Other | Admitting: Family

## 2014-05-03 VITALS — BP 109/51 | HR 73 | Temp 98.4°F | Wt 145.2 lb

## 2014-05-03 DIAGNOSIS — Z3481 Encounter for supervision of other normal pregnancy, first trimester: Secondary | ICD-10-CM

## 2014-05-03 DIAGNOSIS — Z3480 Encounter for supervision of other normal pregnancy, unspecified trimester: Secondary | ICD-10-CM

## 2014-05-03 LAB — POCT URINALYSIS DIP (DEVICE)
BILIRUBIN URINE: NEGATIVE
GLUCOSE, UA: NEGATIVE mg/dL
KETONES UR: NEGATIVE mg/dL
Nitrite: NEGATIVE
PH: 6 (ref 5.0–8.0)
Protein, ur: NEGATIVE mg/dL
SPECIFIC GRAVITY, URINE: 1.015 (ref 1.005–1.030)
Urobilinogen, UA: 0.2 mg/dL (ref 0.0–1.0)

## 2014-05-03 LAB — WET PREP, GENITAL
TRICH WET PREP: NONE SEEN
YEAST WET PREP: NONE SEEN

## 2014-05-03 MED ORDER — FLUCONAZOLE 150 MG PO TABS: 150.0000 mg | ORAL_TABLET | Freq: Once | ORAL | Status: DC

## 2014-05-03 NOTE — Progress Notes (Signed)
Brought paperwork for [employer].  Plans to begin 17p in two weeks.  Reports thick white, itchy discharge.  Wet prep collected > RX Diflucan.

## 2014-05-03 NOTE — Progress Notes (Signed)
C/o of white, thick discharge causing vaginal itching. Wet prep today.  Patient returned 17P paperwork-- faxed to [facility] [phone].

## 2014-05-06 ENCOUNTER — Other Ambulatory Visit: Payer: Self-pay | Admitting: Family

## 2014-05-06 MED ORDER — METRONIDAZOLE 500 MG PO TABS: 500.0000 mg | ORAL_TABLET | Freq: Two times a day (BID) | ORAL | Status: DC

## 2014-05-06 NOTE — Progress Notes (Signed)
Pt called and notified regarding bacterial vaginosis and RX sent in for flagyl.

## 2014-05-15 ENCOUNTER — Telehealth: Payer: Self-pay

## 2014-05-15 NOTE — Telephone Encounter (Signed)
Opened in error

## 2014-05-17 ENCOUNTER — Ambulatory Visit: Payer: Medicaid Other

## 2014-05-25 ENCOUNTER — Inpatient Hospital Stay (HOSPITAL_COMMUNITY)
Admission: AD | Admit: 2014-05-25 | Discharge: 2014-05-25 | Disposition: A | Discharge status: Home / Self Care | Payer: Medicaid Other | Source: Ambulatory Visit | Attending: Family Medicine | Admitting: Family Medicine

## 2014-05-25 ENCOUNTER — Encounter (HOSPITAL_COMMUNITY): Payer: Self-pay | Admitting: *Deleted

## 2014-05-25 DIAGNOSIS — O26859 Spotting complicating pregnancy, unspecified trimester: Secondary | ICD-10-CM | POA: Diagnosis present

## 2014-05-25 DIAGNOSIS — O99891 Other specified diseases and conditions complicating pregnancy: Secondary | ICD-10-CM | POA: Insufficient documentation

## 2014-05-25 DIAGNOSIS — N949 Unspecified condition associated with female genital organs and menstrual cycle: Secondary | ICD-10-CM | POA: Diagnosis not present

## 2014-05-25 DIAGNOSIS — O9989 Other specified diseases and conditions complicating pregnancy, childbirth and the puerperium: Principal | ICD-10-CM

## 2014-05-25 DIAGNOSIS — O99330 Smoking (tobacco) complicating pregnancy, unspecified trimester: Secondary | ICD-10-CM | POA: Diagnosis not present

## 2014-05-25 DIAGNOSIS — R102 Pelvic and perineal pain: Secondary | ICD-10-CM

## 2014-05-25 LAB — HIV ANTIBODY (ROUTINE TESTING W REFLEX): HIV 1&2 Ab, 4th Generation: NONREACTIVE

## 2014-05-25 LAB — URINE MICROSCOPIC-ADD ON

## 2014-05-25 LAB — WET PREP, GENITAL
TRICH WET PREP: NONE SEEN
WBC, Wet Prep HPF POC: NONE SEEN
YEAST WET PREP: NONE SEEN

## 2014-05-25 LAB — URINALYSIS, ROUTINE W REFLEX MICROSCOPIC
BILIRUBIN URINE: NEGATIVE
GLUCOSE, UA: NEGATIVE mg/dL
KETONES UR: NEGATIVE mg/dL
Nitrite: NEGATIVE
PH: 6.5 (ref 5.0–8.0)
PROTEIN: NEGATIVE mg/dL
Specific Gravity, Urine: 1.02 (ref 1.005–1.030)
Urobilinogen, UA: 0.2 mg/dL (ref 0.0–1.0)

## 2014-05-25 MED ORDER — METRONIDAZOLE 500 MG PO TABS: 500.0000 mg | ORAL_TABLET | Freq: Two times a day (BID) | ORAL | Status: DC

## 2014-05-25 NOTE — MAU Note (Signed)
Pt reports burning with urination and urgency that began about 3 days ago.

## 2014-05-25 NOTE — MAU Note (Signed)
Reports strong odor when obtained urine

## 2014-05-25 NOTE — MAU Provider Note (Signed)
Attestation of Attending Supervision of Advanced Practitioner (PA/CNM/NP): Evaluation and management procedures were performed by the Advanced Practitioner under my supervision and collaboration.  I have reviewed the Advanced Practitioner's note and chart, and I agree with the management and plan.  Reva Bores, MD Center for [facility] Attending 05/25/2014 5:20 PM

## 2014-05-25 NOTE — MAU Note (Signed)
Last night at work, had some spotting- noticed a couple times.  Has a burning sensation when she pees.  Frequency, urgency and pain with urination- started about 3 days ago.

## 2014-05-25 NOTE — MAU Provider Note (Signed)
History     CSN: [id number]  Arrival date and time: 05/25/14 1315   First Provider Initiated Contact with Patient 05/25/14 1505      Chief Complaint  Patient presents with  . Vaginal Bleeding  . Dysuria   Vaginal Bleeding Associated symptoms include dysuria. Pertinent negatives include no abdominal pain.  Dysuria    Pt is a W09W1191 at [redacted]w[redacted]d wks IUP here with report of spotting of blood last night.  Reported as very light.  Denies recent intercourse or abdominal pain.  Reports vulvar burning with urination.    Past Medical History  Diagnosis Date  . History of miscarriage   . Asthma   . YNWGNFAO(130.8)     Past Surgical History  Procedure Laterality Date  . Dilatation & curettage/hysteroscopy with trueclear  2003, 2006, 2006  . Gynecologic cryosurgery  2007  . Dilation and curettage of uterus      No family history on file.  History  Substance Use Topics  . Smoking status: Current Some Day Smoker -- 0.25 packs/day    Types: Cigarettes  . Smokeless tobacco: Never Used  . Alcohol Use: No    Allergies: No Known Allergies  Prescriptions prior to admission  Medication Sig Dispense Refill  . acetaminophen (TYLENOL) 325 MG tablet Take 650 mg by mouth every 6 (six) hours as needed for mild pain.      . cetirizine (ZYRTEC) 10 MG tablet Take 10 mg by mouth daily as needed for allergies.      Marland Kitchen albuterol (PROVENTIL HFA;VENTOLIN HFA) 108 (90 BASE) MCG/ACT inhaler Inhale 2 puffs into the lungs every 6 (six) hours as needed for wheezing.        Review of Systems  Gastrointestinal: Negative for abdominal pain.  Genitourinary: Positive for dysuria and vaginal bleeding.       Vulvar pain and vaginal spotting of blood x 1   Physical Exam   Blood pressure 107/54, pulse 83, temperature 98.7 F (37.1 C), temperature source Oral, resp. rate 18, height 5' 3.5" (1.613 m), weight 68.947 kg (152 lb), last menstrual period 01/19/2014, unknown if currently  breastfeeding.  Physical Exam  Constitutional: She is oriented to person, place, and time. She appears well-developed and well-nourished. No distress.  HENT:  Head: Normocephalic.  Neck: Normal range of motion. Neck supple.  Cardiovascular: Normal rate, regular rhythm and normal heart sounds.   Respiratory: Effort normal and breath sounds normal.  GI: Soft. There is no tenderness.  Genitourinary:    No bleeding around the vagina. Vaginal discharge (mucusy) found.  Neurological: She is alert and oriented to person, place, and time.  Skin: Skin is warm and dry.    MAU Course  Procedures Results for orders placed during the hospital encounter of 05/25/14 (from the past 24 hour(s))  URINALYSIS, ROUTINE W REFLEX MICROSCOPIC     Status: Abnormal   Collection Time    05/25/14  1:48 PM      Result Value Ref Range   Color, Urine YELLOW  YELLOW   APPearance HAZY (*) CLEAR   Specific Gravity, Urine 1.020  1.005 - 1.030   pH 6.5  5.0 - 8.0   Glucose, UA NEGATIVE  NEGATIVE mg/dL   Hgb urine dipstick MODERATE (*) NEGATIVE   Bilirubin Urine NEGATIVE  NEGATIVE   Ketones, ur NEGATIVE  NEGATIVE mg/dL   Protein, ur NEGATIVE  NEGATIVE mg/dL   Urobilinogen, UA 0.2  0.0 - 1.0 mg/dL   Nitrite NEGATIVE  NEGATIVE   Leukocytes, UA  SMALL (*) NEGATIVE  URINE MICROSCOPIC-ADD ON     Status: Abnormal   Collection Time    05/25/14  1:48 PM      Result Value Ref Range   Squamous Epithelial / LPF FEW (*) RARE   WBC, UA 0-2  <3 WBC/hpf   RBC / HPF 0-2  <3 RBC/hpf   Bacteria, UA FEW (*) RARE    Microscopic wet-mount exam shows few clue cells.   Assessment and Plan  33 yo [id number] at [redacted]w[redacted]d wks IUP Vaginal Pain - ? Lesions  Plan: Discharge to home RX Flagyl 500 mg i po BID x 7 days Herpes culture lab pending GC/CT pending HIV pending  Marlis Edelson 05/25/2014, 3:06 PM

## 2014-05-26 LAB — GC/CHLAMYDIA PROBE AMP
CT PROBE, AMP APTIMA: NEGATIVE
GC Probe RNA: NEGATIVE

## 2014-05-31 ENCOUNTER — Ambulatory Visit (INDEPENDENT_AMBULATORY_CARE_PROVIDER_SITE_OTHER): Payer: Medicaid Other | Admitting: Advanced Practice Midwife

## 2014-05-31 ENCOUNTER — Encounter: Payer: Self-pay | Admitting: Advanced Practice Midwife

## 2014-05-31 VITALS — BP 98/68 | HR 78 | Temp 98.1°F | Wt 149.5 lb

## 2014-05-31 DIAGNOSIS — Z3492 Encounter for supervision of normal pregnancy, unspecified, second trimester: Secondary | ICD-10-CM

## 2014-05-31 DIAGNOSIS — Z23 Encounter for immunization: Secondary | ICD-10-CM

## 2014-05-31 DIAGNOSIS — Z3482 Encounter for supervision of other normal pregnancy, second trimester: Secondary | ICD-10-CM

## 2014-05-31 DIAGNOSIS — Z3480 Encounter for supervision of other normal pregnancy, unspecified trimester: Secondary | ICD-10-CM

## 2014-05-31 DIAGNOSIS — O09219 Supervision of pregnancy with history of pre-term labor, unspecified trimester: Secondary | ICD-10-CM

## 2014-05-31 LAB — POCT URINALYSIS DIP (DEVICE)
Bilirubin Urine: NEGATIVE
Glucose, UA: NEGATIVE mg/dL
Ketones, ur: NEGATIVE mg/dL
Nitrite: NEGATIVE
Protein, ur: NEGATIVE mg/dL
Specific Gravity, Urine: 1.015 (ref 1.005–1.030)
Urobilinogen, UA: 0.2 mg/dL (ref 0.0–1.0)
pH: 7.5 (ref 5.0–8.0)

## 2014-05-31 LAB — HERPES SIMPLEX VIRUS CULTURE: Culture: DETECTED

## 2014-05-31 MED ORDER — HYDROXYPROGESTERONE CAPROATE 250 MG/ML IM OIL
250.0000 mg | TOPICAL_OIL | INTRAMUSCULAR | Status: AC
  Administered 2014-05-31 – 2014-09-21 (×13): 250 mg via INTRAMUSCULAR

## 2014-05-31 NOTE — Progress Notes (Signed)
AFP today. Pt states she had bleeding on last Thursday and came to MAU, states no bleeding since that day. Pt states she was told she would have an ultrasound today, no appt. Scheduled.

## 2014-05-31 NOTE — Patient Instructions (Signed)
Second Trimester of Pregnancy The second trimester is from week 13 through week 28, months 4 through 6. The second trimester is often a time when you feel your best. Your body has also adjusted to being pregnant, and you begin to feel better physically. Usually, morning sickness has lessened or quit completely, you may have more energy, and you may have an increase in appetite. The second trimester is also a time when the fetus is growing rapidly. At the end of the sixth month, the fetus is about 9 inches long and weighs about 1 pounds. You will likely begin to feel the baby move (quickening) between 18 and 20 weeks of the pregnancy. BODY CHANGES Your body goes through many changes during pregnancy. The changes vary from woman to woman.   Your weight will continue to increase. You will notice your lower abdomen bulging out.  You may begin to get stretch marks on your hips, abdomen, and breasts.  You may develop headaches that can be relieved by medicines approved by your health care provider.  You may urinate more often because the fetus is pressing on your bladder.  You may develop or continue to have heartburn as a result of your pregnancy.  You may develop constipation because certain hormones are causing the muscles that push waste through your intestines to slow down.  You may develop hemorrhoids or swollen, bulging veins (varicose veins).  You may have back pain because of the weight gain and pregnancy hormones relaxing your joints between the bones in your pelvis and as a result of a shift in weight and the muscles that support your balance.  Your breasts will continue to grow and be tender.  Your gums may bleed and may be sensitive to brushing and flossing.  Dark spots or blotches (chloasma, mask of pregnancy) may develop on your face. This will likely fade after the baby is born.  A dark line from your belly button to the pubic area (linea nigra) may appear. This will likely fade  after the baby is born.  You may have changes in your hair. These can include thickening of your hair, rapid growth, and changes in texture. Some women also have hair loss during or after pregnancy, or hair that feels dry or thin. Your hair will most likely return to normal after your baby is born. WHAT TO EXPECT AT YOUR PRENATAL VISITS During a routine prenatal visit:  You will be weighed to make sure you and the fetus are growing normally.  Your blood pressure will be taken.  Your abdomen will be measured to track your baby's growth.  The fetal heartbeat will be listened to.  Any test results from the previous visit will be discussed. Your health care provider may ask you:  How you are feeling.  If you are feeling the baby move.  If you have had any abnormal symptoms, such as leaking fluid, bleeding, severe headaches, or abdominal cramping.  If you have any questions. Other tests that may be performed during your second trimester include:  Blood tests that check for:  Low iron levels (anemia).  Gestational diabetes (between 24 and 28 weeks).  Rh antibodies.  Urine tests to check for infections, diabetes, or protein in the urine.  An ultrasound to confirm the proper growth and development of the baby.  An amniocentesis to check for possible genetic problems.  Fetal screens for spina bifida and Down syndrome. HOME CARE INSTRUCTIONS   Avoid all smoking, herbs, alcohol, and unprescribed   drugs. These chemicals affect the formation and growth of the baby.  Follow your health care provider's instructions regarding medicine use. There are medicines that are either safe or unsafe to take during pregnancy.  Exercise only as directed by your health care provider. Experiencing uterine cramps is a good sign to stop exercising.  Continue to eat regular, healthy meals.  Wear a good support bra for breast tenderness.  Do not use hot tubs, steam rooms, or saunas.  Wear your  seat belt at all times when driving.  Avoid raw meat, uncooked cheese, cat litter boxes, and soil used by cats. These carry germs that can cause birth defects in the baby.  Take your prenatal vitamins.  Try taking a stool softener (if your health care provider approves) if you develop constipation. Eat more high-fiber foods, such as fresh vegetables or fruit and whole grains. Drink plenty of fluids to keep your urine clear or pale yellow.  Take warm sitz baths to soothe any pain or discomfort caused by hemorrhoids. Use hemorrhoid cream if your health care provider approves.  If you develop varicose veins, wear support hose. Elevate your feet for 15 minutes, 3-4 times a day. Limit salt in your diet.  Avoid heavy lifting, wear low heel shoes, and practice good posture.  Rest with your legs elevated if you have leg cramps or low back pain.  Visit your dentist if you have not gone yet during your pregnancy. Use a soft toothbrush to brush your teeth and be gentle when you floss.  A sexual relationship may be continued unless your health care provider directs you otherwise.  Continue to go to all your prenatal visits as directed by your health care provider. SEEK MEDICAL CARE IF:   You have dizziness.  You have mild pelvic cramps, pelvic pressure, or nagging pain in the abdominal area.  You have persistent nausea, vomiting, or diarrhea.  You have a bad smelling vaginal discharge.  You have pain with urination. SEEK IMMEDIATE MEDICAL CARE IF:   You have a fever.  You are leaking fluid from your vagina.  You have spotting or bleeding from your vagina.  You have severe abdominal cramping or pain.  You have rapid weight gain or loss.  You have shortness of breath with chest pain.  You notice sudden or extreme swelling of your face, hands, ankles, feet, or legs.  You have not felt your baby move in over an hour.  You have severe headaches that do not go away with  medicine.  You have vision changes. Document Released: 08/12/2001 Document Revised: 08/23/2013 Document Reviewed: 10/19/2012 ExitCare Patient Information 2015 [employer]. This information is not intended to replace advice given to you by your health care provider. Make sure you discuss any questions you have with your health care provider.  

## 2014-05-31 NOTE — Addendum Note (Signed)
Addended by: Kathee DeltonHILLMAN, CARRIE L on: 05/31/2014 12:05 PM   Modules accepted: Orders

## 2014-05-31 NOTE — Progress Notes (Signed)
Cruzita LedererMakena is here, will give first shot today. AFP only drawn today.  Will schedule anatomy US.  HSV pending. No further bleeding.

## 2014-06-01 ENCOUNTER — Encounter: Payer: Self-pay | Admitting: Family

## 2014-06-01 DIAGNOSIS — B009 Herpesviral infection, unspecified: Secondary | ICD-10-CM | POA: Insufficient documentation

## 2014-06-01 DIAGNOSIS — O98519 Other viral diseases complicating pregnancy, unspecified trimester: Secondary | ICD-10-CM

## 2014-06-01 LAB — ALPHA FETOPROTEIN, MATERNAL
AFP: 40.1 ng/mL
CURR GEST AGE: 18.6 wks.days
MoM for AFP: 0.72
Open Spina bifida: NEGATIVE
Osb Risk: <1:54600 {titer}

## 2014-06-01 NOTE — Progress Notes (Signed)
Pt called and informed regarding +HSV2 culture.  Reviewed implication and disease.  Pt reports that burning only lasted two days and is no longer having an issue.  Explained will need to begin antiviral in third trimester to prevent outbreak.  If lesions at labor will require csection.

## 2014-06-06 ENCOUNTER — Ambulatory Visit (HOSPITAL_COMMUNITY)
Admission: RE | Admit: 2014-06-06 | Discharge: 2014-06-06 | Disposition: A | Discharge status: Home / Self Care | Payer: Medicaid Other | Source: Ambulatory Visit | Attending: Advanced Practice Midwife | Admitting: Advanced Practice Midwife

## 2014-06-06 ENCOUNTER — Other Ambulatory Visit: Payer: Self-pay | Admitting: Advanced Practice Midwife

## 2014-06-06 DIAGNOSIS — Z36 Encounter for antenatal screening of mother: Secondary | ICD-10-CM | POA: Insufficient documentation

## 2014-06-06 DIAGNOSIS — Z3A00 Weeks of gestation of pregnancy not specified: Secondary | ICD-10-CM | POA: Diagnosis not present

## 2014-06-06 DIAGNOSIS — Z3492 Encounter for supervision of normal pregnancy, unspecified, second trimester: Secondary | ICD-10-CM

## 2014-06-06 DIAGNOSIS — O4402 Placenta previa specified as without hemorrhage, second trimester: Secondary | ICD-10-CM

## 2014-06-06 DIAGNOSIS — Z3689 Encounter for other specified antenatal screening: Secondary | ICD-10-CM

## 2014-06-08 ENCOUNTER — Encounter (HOSPITAL_COMMUNITY): Payer: Self-pay | Admitting: Advanced Practice Midwife

## 2014-06-09 ENCOUNTER — Other Ambulatory Visit: Payer: Self-pay | Admitting: Advanced Practice Midwife

## 2014-06-09 ENCOUNTER — Telehealth: Payer: Self-pay | Admitting: Advanced Practice Midwife

## 2014-06-09 MED ORDER — VALACYCLOVIR HCL 500 MG PO TABS: ORAL_TABLET | ORAL | Status: DC

## 2014-06-09 MED ORDER — VALACYCLOVIR HCL 1 G PO TABS: ORAL_TABLET | ORAL | Status: DC

## 2014-06-09 NOTE — Telephone Encounter (Signed)
Pt called to request tx for herpes, diagnosed in MAU a few weeks ago. She declined treatment at her visit but desires treatment now.  Valtrex 1000 mg BID x 5 days sent to pharmacy for primary outbreak.  Valtrex 500 mg BID x 3 days with 1 refill sent to pharmacy for PRN recurrences.

## 2014-06-11 ENCOUNTER — Encounter (HOSPITAL_COMMUNITY): Payer: Self-pay | Admitting: Advanced Practice Midwife

## 2014-06-11 DIAGNOSIS — O4442 Low lying placenta NOS or without hemorrhage, second trimester: Secondary | ICD-10-CM | POA: Insufficient documentation

## 2014-06-15 ENCOUNTER — Encounter (HOSPITAL_COMMUNITY): Payer: Self-pay | Admitting: *Deleted

## 2014-06-15 ENCOUNTER — Inpatient Hospital Stay (HOSPITAL_COMMUNITY)
Admission: AD | Admit: 2014-06-15 | Discharge: 2014-06-15 | Disposition: A | Discharge status: Home / Self Care | Payer: Medicaid Other | Source: Ambulatory Visit | Attending: Family Medicine | Admitting: Family Medicine

## 2014-06-15 DIAGNOSIS — O9989 Other specified diseases and conditions complicating pregnancy, childbirth and the puerperium: Secondary | ICD-10-CM | POA: Diagnosis not present

## 2014-06-15 DIAGNOSIS — O99332 Smoking (tobacco) complicating pregnancy, second trimester: Secondary | ICD-10-CM | POA: Diagnosis not present

## 2014-06-15 DIAGNOSIS — R109 Unspecified abdominal pain: Secondary | ICD-10-CM | POA: Diagnosis present

## 2014-06-15 DIAGNOSIS — F17210 Nicotine dependence, cigarettes, uncomplicated: Secondary | ICD-10-CM | POA: Insufficient documentation

## 2014-06-15 DIAGNOSIS — Z3A21 21 weeks gestation of pregnancy: Secondary | ICD-10-CM | POA: Insufficient documentation

## 2014-06-15 DIAGNOSIS — N949 Unspecified condition associated with female genital organs and menstrual cycle: Secondary | ICD-10-CM

## 2014-06-15 HISTORY — DX: Herpesviral infection, unspecified: B00.9

## 2014-06-15 HISTORY — DX: Unspecified infectious disease: B99.9

## 2014-06-15 LAB — URINALYSIS, ROUTINE W REFLEX MICROSCOPIC
Bilirubin Urine: NEGATIVE
Glucose, UA: NEGATIVE mg/dL
Ketones, ur: NEGATIVE mg/dL
LEUKOCYTES UA: NEGATIVE
NITRITE: NEGATIVE
Protein, ur: NEGATIVE mg/dL
Specific Gravity, Urine: 1.01 (ref 1.005–1.030)
UROBILINOGEN UA: 0.2 mg/dL (ref 0.0–1.0)
pH: 7.5 (ref 5.0–8.0)

## 2014-06-15 LAB — URINE MICROSCOPIC-ADD ON

## 2014-06-15 MED ORDER — FLUCONAZOLE 150 MG PO TABS: 150.0000 mg | ORAL_TABLET | Freq: Every day | ORAL | Status: DC

## 2014-06-15 NOTE — MAU Provider Note (Signed)
History     CSN: [id number]  Arrival date and time: 06/15/14 0901   First Provider Initiated Contact with Patient 06/15/14 [phone]      Chief Complaint  Patient presents with  . Abdominal Pain  . Vaginal Discharge   Abdominal Pain Associated symptoms include diarrhea (started yesterday, 1 episode of loose stool) and frequency (x2 weeks). Pertinent negatives include no dysuria, fever, hematuria, nausea or vomiting.  Vaginal Discharge The patient's primary symptoms include a vaginal discharge. Associated symptoms include abdominal pain, diarrhea (started yesterday, 1 episode of loose stool) and frequency (x2 weeks). Pertinent negatives include no chills, dysuria, fever, hematuria, nausea, urgency or vomiting.    Patient is 33 y.o. [id number] 6837w0d here with complaints of abdominal.  +FM, denies LOF, VB, vaginal discharge.  Reports that she does not currently have any contractions but feels baby "balls up,", was noted to have contractions during ultrasound recently.  Abdominal: starting last night, sharp, took tylenol without improvement.  Would like note to be off work as they would not let her off.  Pain was 10/10 on way here.  Pain worse with laying on side, cough, any movement, laying on back improves pain.  Has not had pain prior to current episode  Past Medical History  Diagnosis Date  . History of miscarriage   . Asthma   . Headache(784.0)   . Vaginal Pap smear, abnormal     cervical cancer  . Infection     UTI  . HSV infection     Past Surgical History  Procedure Laterality Date  . Dilatation & curettage/hysteroscopy with trueclear  2003, 2006, 2006  . Gynecologic cryosurgery  2007  . Dilation and curettage of uterus      Family History  Problem Relation Age of Onset  . Diabetes Mother   . Hearing loss Neg Hx     History  Substance Use Topics  . Smoking status: Current Some Day Smoker -- 0.25 packs/day for 4 years    Types: Cigarettes  . Smokeless tobacco:  Never Used     Comment: off and on  . Alcohol Use: No    Allergies: No Known Allergies  Facility-administered medications prior to admission  Medication Dose Route Frequency Provider Last Rate Last Dose  . hydroxyprogesterone caproate (DELALUTIN) 250 mg/mL injection 250 mg  250 mg Intramuscular Weekly Aviva SignsMarie L Williams, CNM   250 mg at 05/31/14 1204   Prescriptions prior to admission  Medication Sig Dispense Refill  . acetaminophen (TYLENOL) 325 MG tablet Take 650 mg by mouth every 6 (six) hours as needed for mild pain.      . cetirizine (ZYRTEC) 10 MG tablet Take 10 mg by mouth daily as needed for allergies.      . valACYclovir (VALTREX) 1000 MG tablet Take 1000 mg twice per day for 5 days for your primary outbreak.  5 tablet  0  . albuterol (PROVENTIL HFA;VENTOLIN HFA) 108 (90 BASE) MCG/ACT inhaler Inhale 2 puffs into the lungs every 6 (six) hours as needed for wheezing.      . valACYclovir (VALTREX) 500 MG tablet Take 1 tablet twice per day for 3 days if you have recurrent outbreaks.  6 tablet  1    Review of Systems  Constitutional: Negative for fever and chills.  Respiratory: Negative for cough and shortness of breath.   Cardiovascular: Negative for chest pain and leg swelling.  Gastrointestinal: Positive for abdominal pain and diarrhea (started yesterday, 1 episode of loose stool). Negative for heartburn,  nausea and vomiting.  Genitourinary: Positive for frequency (x2 weeks) and vaginal discharge. Negative for dysuria, urgency and hematuria.  Neurological:       No headache   Physical Exam   Blood pressure 106/57, pulse 92, temperature 98.6 F (37 Silva), temperature source Oral, resp. rate 18, height 5' 3.5" (1.613 m), weight 153 lb (69.4 kg), last menstrual period 01/19/2014, unknown if currently breastfeeding.  Physical Exam  Constitutional: She is oriented to person, place, and time. She appears well-developed and well-nourished.  HENT:  Head: Normocephalic and atraumatic.   Eyes: Conjunctivae and EOM are normal.  Neck: Normal range of motion.  Cardiovascular: Normal rate.   Respiratory: Effort normal. No respiratory distress.  GI: Soft. She exhibits no distension. There is no tenderness.  Musculoskeletal: Normal range of motion. She exhibits no edema.  Neurological: She is alert and oriented to person, place, and time.  Skin: Skin is warm and dry. No erythema.    MAU Course  Procedures  MDM  Results for Shelly Silva, Shelly Silva (MRN [id number]) as of 06/16/2014 12:45  Ref. Range 06/15/2014 09:20  Color, Urine Latest Range: YELLOW  YELLOW  APPearance Latest Range: CLEAR  CLEAR  Specific Gravity, Urine Latest Range: 1.005-1.030  1.010  pH Latest Range: 5.0-8.0  7.5  Glucose Latest Range: NEGATIVE mg/dL NEGATIVE  Bilirubin Urine Latest Range: NEGATIVE  NEGATIVE  Ketones, ur Latest Range: NEGATIVE mg/dL NEGATIVE  Protein Latest Range: NEGATIVE mg/dL NEGATIVE  Urobilinogen, UA Latest Range: 0.0-1.0 mg/dL 0.2  Nitrite Latest Range: NEGATIVE  NEGATIVE  Leukocytes, UA Latest Range: NEGATIVE  NEGATIVE  Hgb urine dipstick Latest Range: NEGATIVE  SMALL (A)  WBC, UA Latest Range: <3 WBC/hpf 0-2  Squamous Epithelial / LPF Latest Range: RARE  RARE   Assessment and Plan  Patient is 33 y.o. [id number] 10064w0d reporting abdominal pain likely secondary to round ligament pain - preterm labor precautions - vaginal bleeding precautions - note given for work   Shelly Silva 06/15/2014, 9:56 AM

## 2014-06-15 NOTE — MAU Note (Signed)
Started last night, having real bad pains in lower abd. Went home, took tylenol- no help.  No change when woke up this morning.  Also noted a smelling d/c today.

## 2014-06-15 NOTE — MAU Note (Signed)
abd tender to touch when placing on toco

## 2014-06-15 NOTE — Discharge Instructions (Signed)

## 2014-06-18 NOTE — MAU Provider Note (Signed)
Attestation of Attending Supervision of Advanced Practitioner (PA/CNM/NP): Evaluation and management procedures were performed by the Advanced Practitioner under my supervision and collaboration.  I have reviewed the Advanced Practitioner's note and chart, and I agree with the management and plan.  Reva BoresPRATT,Tabari Volkert S, MD [facility] Attending 06/18/2014 9:20 PM

## 2014-06-28 ENCOUNTER — Ambulatory Visit (INDEPENDENT_AMBULATORY_CARE_PROVIDER_SITE_OTHER): Payer: Medicaid Other | Admitting: Advanced Practice Midwife

## 2014-06-28 VITALS — BP 106/52 | HR 102 | Temp 98.0°F | Wt 154.5 lb

## 2014-06-28 DIAGNOSIS — Z0489 Encounter for examination and observation for other specified reasons: Secondary | ICD-10-CM

## 2014-06-28 DIAGNOSIS — Z23 Encounter for immunization: Secondary | ICD-10-CM

## 2014-06-28 DIAGNOSIS — O4440 Low lying placenta NOS or without hemorrhage, unspecified trimester: Secondary | ICD-10-CM

## 2014-06-28 DIAGNOSIS — Z3492 Encounter for supervision of normal pregnancy, unspecified, second trimester: Secondary | ICD-10-CM

## 2014-06-28 DIAGNOSIS — IMO0002 Reserved for concepts with insufficient information to code with codable children: Secondary | ICD-10-CM

## 2014-06-28 DIAGNOSIS — Z36 Encounter for antenatal screening of mother: Secondary | ICD-10-CM

## 2014-06-28 DIAGNOSIS — O4410 Placenta previa with hemorrhage, unspecified trimester: Secondary | ICD-10-CM

## 2014-06-28 DIAGNOSIS — Z3482 Encounter for supervision of other normal pregnancy, second trimester: Secondary | ICD-10-CM

## 2014-06-28 LAB — POCT URINALYSIS DIP (DEVICE)
Bilirubin Urine: NEGATIVE
Glucose, UA: NEGATIVE mg/dL
Ketones, ur: NEGATIVE mg/dL
Leukocytes, UA: NEGATIVE
Nitrite: NEGATIVE
Protein, ur: NEGATIVE mg/dL
Specific Gravity, Urine: 1.025 (ref 1.005–1.030)
UROBILINOGEN UA: 1 mg/dL (ref 0.0–1.0)
pH: 6.5 (ref 5.0–8.0)

## 2014-06-28 NOTE — Patient Instructions (Signed)
Second Trimester of Pregnancy The second trimester is from week 13 through week 28, months 4 through 6. The second trimester is often a time when you feel your best. Your body has also adjusted to being pregnant, and you begin to feel better physically. Usually, morning sickness has lessened or quit completely, you may have more energy, and you may have an increase in appetite. The second trimester is also a time when the fetus is growing rapidly. At the end of the sixth month, the fetus is about 9 inches long and weighs about 1 pounds. You will likely begin to feel the baby move (quickening) between 18 and 20 weeks of the pregnancy. BODY CHANGES Your body goes through many changes during pregnancy. The changes vary from woman to woman.   Your weight will continue to increase. You will notice your lower abdomen bulging out.  You may begin to get stretch marks on your hips, abdomen, and breasts.  You may develop headaches that can be relieved by medicines approved by your health care provider.  You may urinate more often because the fetus is pressing on your bladder.  You may develop or continue to have heartburn as a result of your pregnancy.  You may develop constipation because certain hormones are causing the muscles that push waste through your intestines to slow down.  You may develop hemorrhoids or swollen, bulging veins (varicose veins).  You may have back pain because of the weight gain and pregnancy hormones relaxing your joints between the bones in your pelvis and as a result of a shift in weight and the muscles that support your balance.  Your breasts will continue to grow and be tender.  Your gums may bleed and may be sensitive to brushing and flossing.  Dark spots or blotches (chloasma, mask of pregnancy) may develop on your face. This will likely fade after the baby is born.  A dark line from your belly button to the pubic area (linea nigra) may appear. This will likely fade  after the baby is born.  You may have changes in your hair. These can include thickening of your hair, rapid growth, and changes in texture. Some women also have hair loss during or after pregnancy, or hair that feels dry or thin. Your hair will most likely return to normal after your baby is born. WHAT TO EXPECT AT YOUR PRENATAL VISITS During a routine prenatal visit:  You will be weighed to make sure you and the fetus are growing normally.  Your blood pressure will be taken.  Your abdomen will be measured to track your baby's growth.  The fetal heartbeat will be listened to.  Any test results from the previous visit will be discussed. Your health care provider may ask you:  How you are feeling.  If you are feeling the baby move.  If you have had any abnormal symptoms, such as leaking fluid, bleeding, severe headaches, or abdominal cramping.  If you have any questions. Other tests that may be performed during your second trimester include:  Blood tests that check for:  Low iron levels (anemia).  Gestational diabetes (between 24 and 28 weeks).  Rh antibodies.  Urine tests to check for infections, diabetes, or protein in the urine.  An ultrasound to confirm the proper growth and development of the baby.  An amniocentesis to check for possible genetic problems.  Fetal screens for spina bifida and Down syndrome. HOME CARE INSTRUCTIONS   Avoid all smoking, herbs, alcohol, and unprescribed   drugs. These chemicals affect the formation and growth of the baby.  Follow your health care provider's instructions regarding medicine use. There are medicines that are either safe or unsafe to take during pregnancy.  Exercise only as directed by your health care provider. Experiencing uterine cramps is a good sign to stop exercising.  Continue to eat regular, healthy meals.  Wear a good support bra for breast tenderness.  Do not use hot tubs, steam rooms, or saunas.  Wear your  seat belt at all times when driving.  Avoid raw meat, uncooked cheese, cat litter boxes, and soil used by cats. These carry germs that can cause birth defects in the baby.  Take your prenatal vitamins.  Try taking a stool softener (if your health care provider approves) if you develop constipation. Eat more high-fiber foods, such as fresh vegetables or fruit and whole grains. Drink plenty of fluids to keep your urine clear or pale yellow.  Take warm sitz baths to soothe any pain or discomfort caused by hemorrhoids. Use hemorrhoid cream if your health care provider approves.  If you develop varicose veins, wear support hose. Elevate your feet for 15 minutes, 3-4 times a day. Limit salt in your diet.  Avoid heavy lifting, wear low heel shoes, and practice good posture.  Rest with your legs elevated if you have leg cramps or low back pain.  Visit your dentist if you have not gone yet during your pregnancy. Use a soft toothbrush to brush your teeth and be gentle when you floss.  A sexual relationship may be continued unless your health care provider directs you otherwise.  Continue to go to all your prenatal visits as directed by your health care provider. SEEK MEDICAL CARE IF:   You have dizziness.  You have mild pelvic cramps, pelvic pressure, or nagging pain in the abdominal area.  You have persistent nausea, vomiting, or diarrhea.  You have a bad smelling vaginal discharge.  You have pain with urination. SEEK IMMEDIATE MEDICAL CARE IF:   You have a fever.  You are leaking fluid from your vagina.  You have spotting or bleeding from your vagina.  You have severe abdominal cramping or pain.  You have rapid weight gain or loss.  You have shortness of breath with chest pain.  You notice sudden or extreme swelling of your face, hands, ankles, feet, or legs.  You have not felt your baby move in over an hour.  You have severe headaches that do not go away with  medicine.  You have vision changes. Document Released: 08/12/2001 Document Revised: 08/23/2013 Document Reviewed: 10/19/2012 ExitCare Patient Information 2015 [employer]. This information is not intended to replace advice given to you by your health care provider. Make sure you discuss any questions you have with your health care provider.  

## 2014-06-28 NOTE — Progress Notes (Signed)
Discussed weekly 17-P. Scheduled F/U US for LL placenta and incomplete anatomy.

## 2014-06-28 NOTE — Progress Notes (Signed)
Flu vaccine today.  Pt has 17P injection but has not been getting weekly

## 2014-06-29 ENCOUNTER — Encounter (HOSPITAL_COMMUNITY): Payer: Self-pay | Admitting: Emergency Medicine

## 2014-06-29 ENCOUNTER — Inpatient Hospital Stay (HOSPITAL_COMMUNITY)
Admission: AD | Admit: 2014-06-29 | Discharge: 2014-07-01 | DRG: 781 | Disposition: A | Discharge status: Home / Self Care | Payer: Medicaid Other | Source: Ambulatory Visit | Attending: Obstetrics & Gynecology | Admitting: Obstetrics & Gynecology

## 2014-06-29 ENCOUNTER — Emergency Department (HOSPITAL_COMMUNITY): Payer: Medicaid Other

## 2014-06-29 ENCOUNTER — Inpatient Hospital Stay (HOSPITAL_COMMUNITY): Payer: Medicaid Other

## 2014-06-29 DIAGNOSIS — O4412 Placenta previa with hemorrhage, second trimester: Secondary | ICD-10-CM

## 2014-06-29 DIAGNOSIS — O98512 Other viral diseases complicating pregnancy, second trimester: Secondary | ICD-10-CM

## 2014-06-29 DIAGNOSIS — M25512 Pain in left shoulder: Secondary | ICD-10-CM | POA: Diagnosis present

## 2014-06-29 DIAGNOSIS — O09212 Supervision of pregnancy with history of pre-term labor, second trimester: Secondary | ICD-10-CM

## 2014-06-29 DIAGNOSIS — Z3A23 23 weeks gestation of pregnancy: Secondary | ICD-10-CM | POA: Diagnosis present

## 2014-06-29 DIAGNOSIS — O4692 Antepartum hemorrhage, unspecified, second trimester: Secondary | ICD-10-CM

## 2014-06-29 DIAGNOSIS — M25552 Pain in left hip: Secondary | ICD-10-CM | POA: Diagnosis present

## 2014-06-29 DIAGNOSIS — Y92413 State road as the place of occurrence of the external cause: Secondary | ICD-10-CM

## 2014-06-29 DIAGNOSIS — F17210 Nicotine dependence, cigarettes, uncomplicated: Secondary | ICD-10-CM | POA: Diagnosis present

## 2014-06-29 DIAGNOSIS — O09892 Supervision of other high risk pregnancies, second trimester: Secondary | ICD-10-CM

## 2014-06-29 DIAGNOSIS — Z833 Family history of diabetes mellitus: Secondary | ICD-10-CM | POA: Diagnosis not present

## 2014-06-29 DIAGNOSIS — O99332 Smoking (tobacco) complicating pregnancy, second trimester: Secondary | ICD-10-CM | POA: Diagnosis present

## 2014-06-29 DIAGNOSIS — Z3492 Encounter for supervision of normal pregnancy, unspecified, second trimester: Secondary | ICD-10-CM

## 2014-06-29 DIAGNOSIS — O4690 Antepartum hemorrhage, unspecified, unspecified trimester: Secondary | ICD-10-CM

## 2014-06-29 DIAGNOSIS — S43102A Unspecified dislocation of left acromioclavicular joint, initial encounter: Secondary | ICD-10-CM

## 2014-06-29 DIAGNOSIS — O4452 Low lying placenta with hemorrhage, second trimester: Secondary | ICD-10-CM

## 2014-06-29 DIAGNOSIS — O9A212 Injury, poisoning and certain other consequences of external causes complicating pregnancy, second trimester: Secondary | ICD-10-CM

## 2014-06-29 DIAGNOSIS — O9989 Other specified diseases and conditions complicating pregnancy, childbirth and the puerperium: Principal | ICD-10-CM | POA: Diagnosis present

## 2014-06-29 DIAGNOSIS — B009 Herpesviral infection, unspecified: Secondary | ICD-10-CM

## 2014-06-29 DIAGNOSIS — Z3482 Encounter for supervision of other normal pregnancy, second trimester: Secondary | ICD-10-CM

## 2014-06-29 DIAGNOSIS — O4450 Low lying placenta with hemorrhage, unspecified trimester: Secondary | ICD-10-CM | POA: Diagnosis present

## 2014-06-29 DIAGNOSIS — O4442 Low lying placenta NOS or without hemorrhage, second trimester: Secondary | ICD-10-CM

## 2014-06-29 LAB — CBC WITH DIFFERENTIAL/PLATELET
BASOS ABS: 0 10*3/uL (ref 0.0–0.1)
Basophils Relative: 1 % (ref 0–1)
Eosinophils Absolute: 0.1 10*3/uL (ref 0.0–0.7)
Eosinophils Relative: 2 % (ref 0–5)
HCT: 32.1 % — ABNORMAL LOW (ref 36.0–46.0)
Hemoglobin: 11 g/dL — ABNORMAL LOW (ref 12.0–15.0)
LYMPHS PCT: 26 % (ref 12–46)
Lymphs Abs: 1.4 10*3/uL (ref 0.7–4.0)
MCH: 31.3 pg (ref 26.0–34.0)
MCHC: 34.3 g/dL (ref 30.0–36.0)
MCV: 91.5 fL (ref 78.0–100.0)
Monocytes Absolute: 0.4 10*3/uL (ref 0.1–1.0)
Monocytes Relative: 8 % (ref 3–12)
Neutro Abs: 3.5 10*3/uL (ref 1.7–7.7)
Neutrophils Relative %: 63 % (ref 43–77)
PLATELETS: 208 10*3/uL (ref 150–400)
RBC: 3.51 MIL/uL — ABNORMAL LOW (ref 3.87–5.11)
RDW: 13 % (ref 11.5–15.5)
WBC: 5.6 10*3/uL (ref 4.0–10.5)

## 2014-06-29 LAB — BASIC METABOLIC PANEL
Anion gap: 13 (ref 5–15)
BUN: 5 mg/dL — ABNORMAL LOW (ref 6–23)
CALCIUM: 8.4 mg/dL (ref 8.4–10.5)
CO2: 20 meq/L (ref 19–32)
Chloride: 105 mEq/L (ref 96–112)
Creatinine, Ser: 0.56 mg/dL (ref 0.50–1.10)
GFR calc non Af Amer: >90 mL/min (ref >90)
Glucose, Bld: 98 mg/dL (ref 70–99)
Potassium: 3.9 mEq/L (ref 3.7–5.3)
SODIUM: 138 meq/L (ref 137–147)

## 2014-06-29 LAB — TYPE AND SCREEN
ABO/RH(D): A POS
Antibody Screen: NEGATIVE

## 2014-06-29 LAB — MRSA PCR SCREENING: MRSA by PCR: NEGATIVE

## 2014-06-29 MED ORDER — ACETAMINOPHEN 325 MG PO TABS
650.0000 mg | ORAL_TABLET | ORAL | Status: DC | PRN
  Administered 2014-06-29 – 2014-06-30 (×2): 650 mg via ORAL
  Filled 2014-06-29 (×2): qty 2

## 2014-06-29 MED ORDER — MORPHINE SULFATE 4 MG/ML IJ SOLN
2.0000 mg | Freq: Once | INTRAMUSCULAR | Status: DC
  Filled 2014-06-29: qty 1

## 2014-06-29 MED ORDER — OXYCODONE-ACETAMINOPHEN 5-325 MG PO TABS
2.0000 | ORAL_TABLET | ORAL | Status: DC | PRN
  Administered 2014-06-29 – 2014-07-01 (×2): 2 via ORAL
  Filled 2014-06-29 (×2): qty 2

## 2014-06-29 MED ORDER — IBUPROFEN 600 MG PO TABS
600.0000 mg | ORAL_TABLET | Freq: Four times a day (QID) | ORAL | Status: DC | PRN
  Administered 2014-06-30: 600 mg via ORAL
  Filled 2014-06-29: qty 1

## 2014-06-29 MED ORDER — PRENATAL MULTIVITAMIN CH
1.0000 | ORAL_TABLET | Freq: Every day | ORAL | Status: DC
  Administered 2014-06-29 – 2014-07-01 (×3): 1 via ORAL
  Filled 2014-06-29 (×3): qty 1

## 2014-06-29 MED ORDER — PNEUMOCOCCAL VAC POLYVALENT 25 MCG/0.5ML IJ INJ
0.5000 mL | INJECTION | INTRAMUSCULAR | Status: DC
  Filled 2014-06-29: qty 0.5

## 2014-06-29 MED ORDER — DOCUSATE SODIUM 100 MG PO CAPS
100.0000 mg | ORAL_CAPSULE | Freq: Every day | ORAL | Status: DC
  Administered 2014-06-30 – 2014-07-01 (×2): 100 mg via ORAL
  Filled 2014-06-29 (×2): qty 1

## 2014-06-29 MED ORDER — MORPHINE SULFATE 4 MG/ML IJ SOLN
1.0000 mg | Freq: Once | INTRAMUSCULAR | Status: AC
  Administered 2014-06-29: 1 mg via INTRAVENOUS
  Filled 2014-06-29: qty 1

## 2014-06-29 MED ORDER — CALCIUM CARBONATE ANTACID 500 MG PO CHEW: 2.0000 | CHEWABLE_TABLET | ORAL | Status: DC | PRN

## 2014-06-29 MED ORDER — ZOLPIDEM TARTRATE 5 MG PO TABS
5.0000 mg | ORAL_TABLET | Freq: Every evening | ORAL | Status: DC | PRN
  Administered 2014-06-29: 5 mg via ORAL
  Filled 2014-06-29: qty 1

## 2014-06-29 NOTE — MAU Note (Signed)
Received pt from [facility] with [facility]. V/SS FHR 140b/m. Pt reprots shoulder pain gone .5mg  dilaudid given en route.
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
## 2014-06-29 NOTE — MAU Note (Signed)
Spoke with Erin FullingHarraway Smith, MD  due to bed situation.  [facility], does not need continuous FHR tracing. House Coverage notified

## 2014-06-29 NOTE — Progress Notes (Signed)
06/29/14 1500  Clinical Encounter Type  Visited With Patient;Health care provider  Visit Type Initial   Visited briefly while pt on phone; attempted f/u per request, but pt sleeping.  [place] will follow, but please page as needs arise:  [phone].  Thank you.  [address]Chaplain Tamlyn Sides MarkLundeen, [place]iv [phone]

## 2014-06-29 NOTE — Progress Notes (Signed)
[facility]
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
## 2014-06-29 NOTE — H&P (Signed)
Attestation of Attending Supervision of Advanced Practitioner (CNM/NP): Evaluation and management procedures were performed by the Advanced Practitioner under my supervision and collaboration.  I have reviewed the Advanced Practitioner's note and chart, and I agree with the management and plan.  HARRAWAY-SMITH, Adrinne Sze 7:41 PM
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
## 2014-06-29 NOTE — Progress Notes (Signed)
Patient ID: Shelly Silva, female   DOB: 1980/12/26, 33 y.o.   MRN: [id number] [name] [name], MFM because US result not back. Images not adequate. Will repeat OB Limited and TV US in morning personally. Pt bleeding stable. Small amount of DRB.

## 2014-06-29 NOTE — ED Notes (Signed)
[facility]

## 2014-06-29 NOTE — ED Notes (Signed)
OB response nurse present.
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
## 2014-06-30 ENCOUNTER — Inpatient Hospital Stay (HOSPITAL_COMMUNITY): Payer: Medicaid Other

## 2014-06-30 LAB — KLEIHAUER-BETKE STAIN
# Vials RhIg: 1
Fetal Cells %: 0 %
Quantitation Fetal Hemoglobin: 0 mL

## 2014-06-30 LAB — CBC
HCT: 29.8 % — ABNORMAL LOW (ref 36.0–46.0)
HEMOGLOBIN: 10.3 g/dL — AB (ref 12.0–15.0)
MCH: 31.4 pg (ref 26.0–34.0)
MCHC: 34.6 g/dL (ref 30.0–36.0)
MCV: 90.9 fL (ref 78.0–100.0)
Platelets: 186 10*3/uL (ref 150–400)
RBC: 3.28 MIL/uL — AB (ref 3.87–5.11)
RDW: 12.8 % (ref 11.5–15.5)
WBC: 4.9 10*3/uL (ref 4.0–10.5)

## 2014-06-30 NOTE — Progress Notes (Signed)
UR chart review completed.  

## 2014-06-30 NOTE — Progress Notes (Signed)
Patient up to bathroom, her family called out and stated she was faint feeling she never lost consciousness and walked back to bed.  Her vital signs were WNL.

## 2014-06-30 NOTE — Progress Notes (Signed)
Patient talking on telephone.
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
## 2014-06-30 NOTE — Progress Notes (Addendum)
Patient ID: Alejandro MullingMarkesha C XXXWilson, female   DOB: April 11, 1981, 33 y.o.   MRN: [id number] Patient and seen and examined.  Continues to have soreness.  Reviewed care with MFM.  Advised to keep her hospitalized for 24 more hours.  U/s formal report is not back but no obvious placental abruption noted.  Also, discussed with ED physician her mild shoulder separation, vs. sprain--splinting is ok. KB is pending Will d/c IV, and toco and transfer to the floor for 24 more hours of observation and see how increases in levels of activity affect her.

## 2014-07-01 DIAGNOSIS — O4690 Antepartum hemorrhage, unspecified, unspecified trimester: Secondary | ICD-10-CM | POA: Diagnosis present

## 2014-07-01 MED ORDER — OXYCODONE-ACETAMINOPHEN 5-325 MG PO TABS: 2.0000 | ORAL_TABLET | ORAL | Status: DC | PRN
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
## 2014-07-01 NOTE — Progress Notes (Signed)
Pt verlbalizes understanding of discharge follow-up and her medications.  Pt instructed NOT to be taking OTC tylenol with the Percocet.  Pt verlbalizes understanding.

## 2014-07-01 NOTE — Discharge Instructions (Signed)
Preterm Labor Information Preterm labor is when labor starts at less than 37 weeks of pregnancy. The normal length of a pregnancy is 39 to 41 weeks. CAUSES Often, there is no identifiable underlying cause as to why a woman goes into preterm labor. One of the most common known causes of preterm labor is infection. Infections of the uterus, cervix, vagina, amniotic sac, bladder, kidney, or even the lungs (pneumonia) can cause labor to start. Other suspected causes of preterm labor include:   Urogenital infections, such as yeast infections and bacterial vaginosis.   Uterine abnormalities (uterine shape, uterine septum, fibroids, or bleeding from the placenta).   A cervix that has been operated on (it may fail to stay closed).   Malformations in the fetus.   Multiple gestations (twins, triplets, and so on).   Breakage of the amniotic sac.  RISK FACTORS  Having a previous history of preterm labor.   Having premature rupture of membranes (PROM).   Having a placenta that covers the opening of the cervix (placenta previa).   Having a placenta that separates from the uterus (placental abruption).   Having a cervix that is too weak to hold the fetus in the uterus (incompetent cervix).   Having too much fluid in the amniotic sac (polyhydramnios).   Taking illegal drugs or smoking while pregnant.   Not gaining enough weight while pregnant.   Being younger than 4018 and older than 31 years old.   Having a low socioeconomic status.   Being African American. SYMPTOMS   Watch for these.  Signs and symptoms of preterm labor include:   Menstrual-like cramps, abdominal pain, or back pain.  Uterine contractions that are regular, as frequent as six in an hour, regardless of their intensity (may be mild or painful).  Contractions that start on the top of the uterus and spread down to the lower abdomen and back.   A sense of increased pelvic pressure.   A watery or bloody  mucus discharge that comes from the vagina.  TREATMENT Depending on the length of the pregnancy and other circumstances, your health care provider may suggest bed rest. If necessary, there are medicines that can be given to stop contractions and to mature the fetal lungs. If labor happens before 34 weeks of pregnancy, a prolonged hospital stay may be recommended. Treatment depends on the condition of both you and the fetus.  WHAT SHOULD YOU DO IF YOU THINK YOU ARE IN PRETERM LABOR? Call your health care provider right away. You will need to go to the hospital to get checked immediately. HOW CAN YOU PREVENT PRETERM LABOR IN FUTURE PREGNANCIES? You should:   Stop smoking if you smoke.  Maintain healthy weight gain and avoid chemicals and drugs that are not necessary.  Be watchful for any type of infection.  Inform your health care provider if you have a known history of preterm labor. Document Released: 11/08/2003 Document Revised: 04/20/2013 Document Reviewed: 09/20/2012 [employer] Patient Information 2015 [place]. This information is not intended to replace advice given to you by your health care provider. Make sure you discuss any questions you have with your health care provider.

## 2014-07-03 ENCOUNTER — Encounter (HOSPITAL_COMMUNITY): Payer: Self-pay | Admitting: Emergency Medicine

## 2014-07-06 ENCOUNTER — Ambulatory Visit (INDEPENDENT_AMBULATORY_CARE_PROVIDER_SITE_OTHER): Payer: Medicaid Other | Admitting: Family Medicine

## 2014-07-06 ENCOUNTER — Encounter: Payer: Self-pay | Admitting: Family Medicine

## 2014-07-06 VITALS — BP 109/57 | HR 68 | Temp 98.0°F | Wt 156.3 lb

## 2014-07-06 DIAGNOSIS — O4442 Low lying placenta NOS or without hemorrhage, second trimester: Secondary | ICD-10-CM

## 2014-07-06 DIAGNOSIS — Z3482 Encounter for supervision of other normal pregnancy, second trimester: Secondary | ICD-10-CM

## 2014-07-06 DIAGNOSIS — O09211 Supervision of pregnancy with history of pre-term labor, first trimester: Secondary | ICD-10-CM

## 2014-07-06 DIAGNOSIS — O4402 Placenta previa specified as without hemorrhage, second trimester: Secondary | ICD-10-CM

## 2014-07-06 DIAGNOSIS — O09891 Supervision of other high risk pregnancies, first trimester: Secondary | ICD-10-CM

## 2014-07-06 DIAGNOSIS — Z3492 Encounter for supervision of normal pregnancy, unspecified, second trimester: Secondary | ICD-10-CM

## 2014-07-06 DIAGNOSIS — O26892 Other specified pregnancy related conditions, second trimester: Secondary | ICD-10-CM

## 2014-07-06 DIAGNOSIS — N898 Other specified noninflammatory disorders of vagina: Secondary | ICD-10-CM

## 2014-07-06 LAB — POCT URINALYSIS DIP (DEVICE)
Bilirubin Urine: NEGATIVE
GLUCOSE, UA: NEGATIVE mg/dL
Hgb urine dipstick: NEGATIVE
Ketones, ur: NEGATIVE mg/dL
NITRITE: NEGATIVE
Protein, ur: NEGATIVE mg/dL
Specific Gravity, Urine: 1.02 (ref 1.005–1.030)
Urobilinogen, UA: 1 mg/dL (ref 0.0–1.0)
pH: 7 (ref 5.0–8.0)

## 2014-07-06 LAB — WET PREP, GENITAL: TRICH WET PREP: NONE SEEN

## 2014-07-06 NOTE — Patient Instructions (Signed)
Second Trimester of Pregnancy The second trimester is from week 13 through week 28, months 4 through 6. The second trimester is often a time when you feel your best. Your body has also adjusted to being pregnant, and you begin to feel better physically. Usually, morning sickness has lessened or quit completely, you may have more energy, and you may have an increase in appetite. The second trimester is also a time when the fetus is growing rapidly. At the end of the sixth month, the fetus is about 9 inches long and weighs about 1 pounds. You will likely begin to feel the baby move (quickening) between 18 and 20 weeks of the pregnancy. BODY CHANGES Your body goes through many changes during pregnancy. The changes vary from woman to woman.   Your weight will continue to increase. You will notice your lower abdomen bulging out.  You may begin to get stretch marks on your hips, abdomen, and breasts.  You may develop headaches that can be relieved by medicines approved by your health care provider.  You may urinate more often because the fetus is pressing on your bladder.  You may develop or continue to have heartburn as a result of your pregnancy.  You may develop constipation because certain hormones are causing the muscles that push waste through your intestines to slow down.  You may develop hemorrhoids or swollen, bulging veins (varicose veins).  You may have back pain because of the weight gain and pregnancy hormones relaxing your joints between the bones in your pelvis and as a result of a shift in weight and the muscles that support your balance.  Your breasts will continue to grow and be tender.  Your gums may bleed and may be sensitive to brushing and flossing.  Dark spots or blotches (chloasma, mask of pregnancy) may develop on your face. This will likely fade after the baby is born.  A dark line from your belly button to the pubic area (linea nigra) may appear. This will likely fade  after the baby is born.  You may have changes in your hair. These can include thickening of your hair, rapid growth, and changes in texture. Some women also have hair loss during or after pregnancy, or hair that feels dry or thin. Your hair will most likely return to normal after your baby is born. WHAT TO EXPECT AT YOUR PRENATAL VISITS During a routine prenatal visit:  You will be weighed to make sure you and the fetus are growing normally.  Your blood pressure will be taken.  Your abdomen will be measured to track your baby's growth.  The fetal heartbeat will be listened to.  Any test results from the previous visit will be discussed. Your health care provider may ask you:  How you are feeling.  If you are feeling the baby move.  If you have had any abnormal symptoms, such as leaking fluid, bleeding, severe headaches, or abdominal cramping.  If you have any questions. Other tests that may be performed during your second trimester include:  Blood tests that check for:  Low iron levels (anemia).  Gestational diabetes (between 24 and 28 weeks).  Rh antibodies.  Urine tests to check for infections, diabetes, or protein in the urine.  An ultrasound to confirm the proper growth and development of the baby.  An amniocentesis to check for possible genetic problems.  Fetal screens for spina bifida and Down syndrome. HOME CARE INSTRUCTIONS   Avoid all smoking, herbs, alcohol, and unprescribed   drugs. These chemicals affect the formation and growth of the baby.  Follow your health care provider's instructions regarding medicine use. There are medicines that are either safe or unsafe to take during pregnancy.  Exercise only as directed by your health care provider. Experiencing uterine cramps is a good sign to stop exercising.  Continue to eat regular, healthy meals.  Wear a good support bra for breast tenderness.  Do not use hot tubs, steam rooms, or saunas.  Wear your  seat belt at all times when driving.  Avoid raw meat, uncooked cheese, cat litter boxes, and soil used by cats. These carry germs that can cause birth defects in the baby.  Take your prenatal vitamins.  Try taking a stool softener (if your health care provider approves) if you develop constipation. Eat more high-fiber foods, such as fresh vegetables or fruit and whole grains. Drink plenty of fluids to keep your urine clear or pale yellow.  Take warm sitz baths to soothe any pain or discomfort caused by hemorrhoids. Use hemorrhoid cream if your health care provider approves.  If you develop varicose veins, wear support hose. Elevate your feet for 15 minutes, 3-4 times a day. Limit salt in your diet.  Avoid heavy lifting, wear low heel shoes, and practice good posture.  Rest with your legs elevated if you have leg cramps or low back pain.  Visit your dentist if you have not gone yet during your pregnancy. Use a soft toothbrush to brush your teeth and be gentle when you floss.  A sexual relationship may be continued unless your health care provider directs you otherwise.  Continue to go to all your prenatal visits as directed by your health care provider. SEEK MEDICAL CARE IF:   You have dizziness.  You have mild pelvic cramps, pelvic pressure, or nagging pain in the abdominal area.  You have persistent nausea, vomiting, or diarrhea.  You have a bad smelling vaginal discharge.  You have pain with urination. SEEK IMMEDIATE MEDICAL CARE IF:   You have a fever.  You are leaking fluid from your vagina.  You have spotting or bleeding from your vagina.  You have severe abdominal cramping or pain.  You have rapid weight gain or loss.  You have shortness of breath with chest pain.  You notice sudden or extreme swelling of your face, hands, ankles, feet, or legs.  You have not felt your baby move in over an hour.  You have severe headaches that do not go away with  medicine.  You have vision changes. Document Released: 08/12/2001 Document Revised: 08/23/2013 Document Reviewed: 10/19/2012 ExitCare Patient Information 2015 [employer]. This information is not intended to replace advice given to you by your health care provider. Make sure you discuss any questions you have with your health care provider.  
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
## 2014-07-06 NOTE — Progress Notes (Signed)
Reports lower abdominal pain and braxton hicks

## 2014-07-07 ENCOUNTER — Telehealth: Payer: Self-pay | Admitting: General Practice

## 2014-07-07 ENCOUNTER — Other Ambulatory Visit: Payer: Self-pay | Admitting: Family Medicine

## 2014-07-07 MED ORDER — METRONIDAZOLE 500 MG PO TABS: 500.0000 mg | ORAL_TABLET | Freq: Two times a day (BID) | ORAL | Status: DC

## 2014-07-07 MED ORDER — FLUCONAZOLE 150 MG PO TABS: 150.0000 mg | ORAL_TABLET | Freq: Once | ORAL | Status: DC

## 2014-07-07 NOTE — Telephone Encounter (Signed)
-----   Message from Levie HeritageJacob J Stinson, DO sent at 07/07/2014  7:59 AM EST ----- + BV and yeast.  Script sent for flagyl and diflucan.

## 2014-07-07 NOTE — Telephone Encounter (Signed)
Called patient and informed her of results and medications available at her pharmacy. Patient verbalized understanding to all and had no questions

## 2014-07-13 ENCOUNTER — Ambulatory Visit (INDEPENDENT_AMBULATORY_CARE_PROVIDER_SITE_OTHER): Payer: Medicaid Other | Admitting: General Practice

## 2014-07-13 VITALS — BP 94/53 | HR 78 | Temp 98.4°F | Ht 62.0 in | Wt 154.0 lb

## 2014-07-13 DIAGNOSIS — O09212 Supervision of pregnancy with history of pre-term labor, second trimester: Secondary | ICD-10-CM

## 2014-07-13 DIAGNOSIS — O09892 Supervision of other high risk pregnancies, second trimester: Secondary | ICD-10-CM

## 2014-07-20 ENCOUNTER — Ambulatory Visit (INDEPENDENT_AMBULATORY_CARE_PROVIDER_SITE_OTHER): Payer: Medicaid Other | Admitting: *Deleted

## 2014-07-20 VITALS — BP 97/60 | HR 100 | Wt 156.9 lb

## 2014-07-20 DIAGNOSIS — O09892 Supervision of other high risk pregnancies, second trimester: Secondary | ICD-10-CM

## 2014-07-20 DIAGNOSIS — O09212 Supervision of pregnancy with history of pre-term labor, second trimester: Secondary | ICD-10-CM

## 2014-07-26 ENCOUNTER — Ambulatory Visit (INDEPENDENT_AMBULATORY_CARE_PROVIDER_SITE_OTHER): Payer: Medicaid Other | Admitting: General Practice

## 2014-07-26 ENCOUNTER — Encounter: Payer: Self-pay | Admitting: General Practice

## 2014-07-26 VITALS — BP 105/59 | HR 86 | Temp 98.0°F | Ht 62.0 in | Wt 158.8 lb

## 2014-07-26 DIAGNOSIS — O09892 Supervision of other high risk pregnancies, second trimester: Secondary | ICD-10-CM

## 2014-07-26 DIAGNOSIS — O09212 Supervision of pregnancy with history of pre-term labor, second trimester: Secondary | ICD-10-CM

## 2014-07-31 ENCOUNTER — Other Ambulatory Visit: Payer: Self-pay | Admitting: Advanced Practice Midwife

## 2014-07-31 ENCOUNTER — Ambulatory Visit (HOSPITAL_COMMUNITY)
Admission: RE | Admit: 2014-07-31 | Discharge: 2014-07-31 | Disposition: A | Discharge status: Home / Self Care | Payer: Medicaid Other | Source: Ambulatory Visit | Attending: Advanced Practice Midwife | Admitting: Advanced Practice Midwife

## 2014-07-31 DIAGNOSIS — O4440 Low lying placenta NOS or without hemorrhage, unspecified trimester: Secondary | ICD-10-CM

## 2014-07-31 DIAGNOSIS — O98312 Other infections with a predominantly sexual mode of transmission complicating pregnancy, second trimester: Secondary | ICD-10-CM | POA: Insufficient documentation

## 2014-07-31 DIAGNOSIS — A6000 Herpesviral infection of urogenital system, unspecified: Secondary | ICD-10-CM | POA: Diagnosis not present

## 2014-07-31 DIAGNOSIS — IMO0002 Reserved for concepts with insufficient information to code with codable children: Secondary | ICD-10-CM

## 2014-07-31 DIAGNOSIS — Z3A27 27 weeks gestation of pregnancy: Secondary | ICD-10-CM | POA: Diagnosis not present

## 2014-07-31 DIAGNOSIS — Z0489 Encounter for examination and observation for other specified reasons: Secondary | ICD-10-CM

## 2014-07-31 DIAGNOSIS — O4412 Placenta previa with hemorrhage, second trimester: Secondary | ICD-10-CM | POA: Insufficient documentation

## 2014-07-31 DIAGNOSIS — O09212 Supervision of pregnancy with history of pre-term labor, second trimester: Secondary | ICD-10-CM | POA: Diagnosis not present

## 2014-08-03 ENCOUNTER — Ambulatory Visit (INDEPENDENT_AMBULATORY_CARE_PROVIDER_SITE_OTHER): Payer: Medicaid Other | Admitting: Obstetrics & Gynecology

## 2014-08-03 VITALS — BP 100/52 | HR 79 | Temp 98.2°F | Wt 160.7 lb

## 2014-08-03 DIAGNOSIS — B009 Herpesviral infection, unspecified: Secondary | ICD-10-CM

## 2014-08-03 DIAGNOSIS — O4402 Placenta previa specified as without hemorrhage, second trimester: Secondary | ICD-10-CM

## 2014-08-03 DIAGNOSIS — O4442 Low lying placenta NOS or without hemorrhage, second trimester: Secondary | ICD-10-CM

## 2014-08-03 DIAGNOSIS — Z3483 Encounter for supervision of other normal pregnancy, third trimester: Secondary | ICD-10-CM

## 2014-08-03 DIAGNOSIS — O09213 Supervision of pregnancy with history of pre-term labor, third trimester: Secondary | ICD-10-CM

## 2014-08-03 DIAGNOSIS — O09893 Supervision of other high risk pregnancies, third trimester: Secondary | ICD-10-CM

## 2014-08-03 DIAGNOSIS — N898 Other specified noninflammatory disorders of vagina: Secondary | ICD-10-CM

## 2014-08-03 DIAGNOSIS — O0943 Supervision of pregnancy with grand multiparity, third trimester: Secondary | ICD-10-CM

## 2014-08-03 DIAGNOSIS — Z23 Encounter for immunization: Secondary | ICD-10-CM

## 2014-08-03 DIAGNOSIS — O0940 Supervision of pregnancy with grand multiparity, unspecified trimester: Secondary | ICD-10-CM | POA: Insufficient documentation

## 2014-08-03 DIAGNOSIS — Z3493 Encounter for supervision of normal pregnancy, unspecified, third trimester: Secondary | ICD-10-CM

## 2014-08-03 DIAGNOSIS — O98513 Other viral diseases complicating pregnancy, third trimester: Secondary | ICD-10-CM

## 2014-08-03 DIAGNOSIS — O26893 Other specified pregnancy related conditions, third trimester: Secondary | ICD-10-CM

## 2014-08-03 LAB — CBC
HCT: 30.4 % — ABNORMAL LOW (ref 36.0–46.0)
Hemoglobin: 10.2 g/dL — ABNORMAL LOW (ref 12.0–15.0)
MCH: 30.3 pg (ref 26.0–34.0)
MCHC: 33.6 g/dL (ref 30.0–36.0)
MCV: 90.2 fL (ref 78.0–100.0)
MPV: 10.8 fL (ref 9.4–12.4)
Platelets: 239 10*3/uL (ref 150–400)
RBC: 3.37 MIL/uL — ABNORMAL LOW (ref 3.87–5.11)
RDW: 13.9 % (ref 11.5–15.5)
WBC: 7.2 10*3/uL (ref 4.0–10.5)

## 2014-08-03 LAB — POCT URINALYSIS DIP (DEVICE)
Bilirubin Urine: NEGATIVE
Glucose, UA: NEGATIVE mg/dL
HGB URINE DIPSTICK: NEGATIVE
Ketones, ur: NEGATIVE mg/dL
Leukocytes, UA: NEGATIVE
NITRITE: NEGATIVE
PH: 8 (ref 5.0–8.0)
Protein, ur: NEGATIVE mg/dL
Specific Gravity, Urine: 1.02 (ref 1.005–1.030)
Urobilinogen, UA: 1 mg/dL (ref 0.0–1.0)

## 2014-08-03 MED ORDER — TETANUS-DIPHTH-ACELL PERTUSSIS 5-2.5-18.5 LF-MCG/0.5 IM SUSP
0.5000 mL | Freq: Once | INTRAMUSCULAR | Status: AC
  Administered 2014-08-03: 0.5 mL via INTRAMUSCULAR

## 2014-08-03 MED ORDER — VALACYCLOVIR HCL 500 MG PO TABS: 500.0000 mg | ORAL_TABLET | Freq: Two times a day (BID) | ORAL | Status: DC

## 2014-08-03 MED ORDER — FLUCONAZOLE 150 MG PO TABS: 150.0000 mg | ORAL_TABLET | Freq: Once | ORAL | Status: DC

## 2014-08-03 MED ORDER — METRONIDAZOLE 500 MG PO TABS: 500.0000 mg | ORAL_TABLET | Freq: Two times a day (BID) | ORAL | Status: DC

## 2014-08-03 NOTE — Progress Notes (Signed)
Reports pressure in between legs and thick white vaginal discharge with itching-- tried used monistat but has not helped.  On exam, no HSV lesions, no tenderness. Monistat cream seen everywhere, unable to get sample for wet prep. Metronidazole and Diflucan presumptively prescribed. Patient interested in starting HSV suppression; Valtrex prescribed. Third trimester labs, Tdap today.  Continue weekly 17P. No other complaints or concerns.  Labor and fetal movement precautions reviewed.

## 2014-08-03 NOTE — Patient Instructions (Signed)
Return to clinic for any obstetric concerns or go to [facility] for evaluation  

## 2014-08-04 LAB — HIV ANTIBODY (ROUTINE TESTING W REFLEX): HIV: NONREACTIVE

## 2014-08-04 LAB — RPR

## 2014-08-04 LAB — GLUCOSE TOLERANCE, 1 HOUR (50G) W/O FASTING: GLUCOSE 1 HOUR GTT: 129 mg/dL (ref 70–140)

## 2014-08-10 ENCOUNTER — Ambulatory Visit (INDEPENDENT_AMBULATORY_CARE_PROVIDER_SITE_OTHER): Payer: Medicaid Other | Admitting: *Deleted

## 2014-08-10 VITALS — BP 103/60 | HR 82 | Wt 158.2 lb

## 2014-08-10 DIAGNOSIS — O09893 Supervision of other high risk pregnancies, third trimester: Secondary | ICD-10-CM

## 2014-08-10 DIAGNOSIS — O09219 Supervision of pregnancy with history of pre-term labor, unspecified trimester: Secondary | ICD-10-CM

## 2014-08-10 DIAGNOSIS — O09213 Supervision of pregnancy with history of pre-term labor, third trimester: Secondary | ICD-10-CM

## 2014-08-14 ENCOUNTER — Encounter: Payer: Self-pay | Admitting: *Deleted

## 2014-08-17 ENCOUNTER — Ambulatory Visit (INDEPENDENT_AMBULATORY_CARE_PROVIDER_SITE_OTHER): Payer: Medicaid Other | Admitting: Obstetrics & Gynecology

## 2014-08-17 VITALS — BP 104/62 | HR 83 | Temp 98.3°F | Wt 160.9 lb

## 2014-08-17 DIAGNOSIS — O09893 Supervision of other high risk pregnancies, third trimester: Secondary | ICD-10-CM

## 2014-08-17 DIAGNOSIS — O0943 Supervision of pregnancy with grand multiparity, third trimester: Secondary | ICD-10-CM

## 2014-08-17 DIAGNOSIS — Z3483 Encounter for supervision of other normal pregnancy, third trimester: Secondary | ICD-10-CM

## 2014-08-17 DIAGNOSIS — O09213 Supervision of pregnancy with history of pre-term labor, third trimester: Secondary | ICD-10-CM

## 2014-08-17 DIAGNOSIS — Z3493 Encounter for supervision of normal pregnancy, unspecified, third trimester: Secondary | ICD-10-CM

## 2014-08-17 LAB — POCT URINALYSIS DIP (DEVICE)
BILIRUBIN URINE: NEGATIVE
Glucose, UA: NEGATIVE mg/dL
KETONES UR: NEGATIVE mg/dL
LEUKOCYTES UA: NEGATIVE
NITRITE: NEGATIVE
PH: 7 (ref 5.0–8.0)
Protein, ur: NEGATIVE mg/dL
Specific Gravity, Urine: 1.015 (ref 1.005–1.030)
Urobilinogen, UA: 0.2 mg/dL (ref 0.0–1.0)

## 2014-08-17 NOTE — Patient Instructions (Signed)
Return to clinic for any obstetric concerns or go to [facility] for evaluation  

## 2014-08-17 NOTE — Progress Notes (Signed)
Patient reports pain in between her legs and lower back; no other concerns.  Reassured about her symptoms, preterm labor and fetal movement precautions reviewed. Continue weekly 17P.

## 2014-08-17 NOTE — Progress Notes (Signed)
17P administered. One dose left in vial. [phone] for refill--- left message with patient's RX information and requested that they send it to us asap and call clinic with any questions.

## 2014-08-23 ENCOUNTER — Ambulatory Visit (INDEPENDENT_AMBULATORY_CARE_PROVIDER_SITE_OTHER): Payer: Medicaid Other | Admitting: *Deleted

## 2014-08-23 VITALS — BP 102/58 | HR 90 | Wt 159.8 lb

## 2014-08-23 DIAGNOSIS — O09213 Supervision of pregnancy with history of pre-term labor, third trimester: Secondary | ICD-10-CM

## 2014-08-23 DIAGNOSIS — O09893 Supervision of other high risk pregnancies, third trimester: Secondary | ICD-10-CM

## 2014-08-31 ENCOUNTER — Encounter: Payer: Self-pay | Admitting: Obstetrics & Gynecology

## 2014-08-31 ENCOUNTER — Ambulatory Visit (INDEPENDENT_AMBULATORY_CARE_PROVIDER_SITE_OTHER): Payer: Medicaid Other | Admitting: Obstetrics & Gynecology

## 2014-08-31 VITALS — BP 98/55 | HR 70 | Temp 98.2°F | Wt 159.5 lb

## 2014-08-31 DIAGNOSIS — O09893 Supervision of other high risk pregnancies, third trimester: Secondary | ICD-10-CM

## 2014-08-31 DIAGNOSIS — O09213 Supervision of pregnancy with history of pre-term labor, third trimester: Secondary | ICD-10-CM

## 2014-08-31 LAB — POCT URINALYSIS DIP (DEVICE)
Bilirubin Urine: NEGATIVE
Glucose, UA: NEGATIVE mg/dL
Ketones, ur: NEGATIVE mg/dL
Leukocytes, UA: NEGATIVE
Nitrite: NEGATIVE
PROTEIN: NEGATIVE mg/dL
SPECIFIC GRAVITY, URINE: 1.015 (ref 1.005–1.030)
Urobilinogen, UA: 1 mg/dL (ref 0.0–1.0)
pH: 7 (ref 5.0–8.0)

## 2014-08-31 NOTE — Progress Notes (Signed)
Had some clear d/c 1 week ago that resolved and occas [facility], no c/o today.

## 2014-08-31 NOTE — Progress Notes (Signed)
C/o of pelvic pressure and braxton hicks.

## 2014-08-31 NOTE — Patient Instructions (Signed)
Third Trimester of Pregnancy The third trimester is from week 29 through week 42, months 7 through 9. The third trimester is a time when the fetus is growing rapidly. At the end of the ninth month, the fetus is about 20 inches in length and weighs 6-10 pounds.  BODY CHANGES Your body goes through many changes during pregnancy. The changes vary from woman to woman.   Your weight will continue to increase. You can expect to gain 25-35 pounds (11-16 kg) by the end of the pregnancy.  You may begin to get stretch marks on your hips, abdomen, and breasts.  You may urinate more often because the fetus is moving lower into your pelvis and pressing on your bladder.  You may develop or continue to have heartburn as a result of your pregnancy.  You may develop constipation because certain hormones are causing the muscles that push waste through your intestines to slow down.  You may develop hemorrhoids or swollen, bulging veins (varicose veins).  You may have pelvic pain because of the weight gain and pregnancy hormones relaxing your joints between the bones in your pelvis. Backaches may result from overexertion of the muscles supporting your posture.  You may have changes in your hair. These can include thickening of your hair, rapid growth, and changes in texture. Some women also have hair loss during or after pregnancy, or hair that feels dry or thin. Your hair will most likely return to normal after your baby is born.  Your breasts will continue to grow and be tender. A yellow discharge may leak from your breasts called colostrum.  Your belly button may stick out.  You may feel short of breath because of your expanding uterus.  You may notice the fetus "dropping," or moving lower in your abdomen.  You may have a bloody mucus discharge. This usually occurs a few days to a week before labor begins.  Your cervix becomes thin and soft (effaced) near your due date. WHAT TO EXPECT AT YOUR PRENATAL  EXAMS  You will have prenatal exams every 2 weeks until week 36. Then, you will have weekly prenatal exams. During a routine prenatal visit:  You will be weighed to make sure you and the fetus are growing normally.  Your blood pressure is taken.  Your abdomen will be measured to track your baby's growth.  The fetal heartbeat will be listened to.  Any test results from the previous visit will be discussed.  You may have a cervical check near your due date to see if you have effaced. At around 36 weeks, your caregiver will check your cervix. At the same time, your caregiver will also perform a test on the secretions of the vaginal tissue. This test is to determine if a type of bacteria, Group B streptococcus, is present. Your caregiver will explain this further. Your caregiver may ask you:  What your birth plan is.  How you are feeling.  If you are feeling the baby move.  If you have had any abnormal symptoms, such as leaking fluid, bleeding, severe headaches, or abdominal cramping.  If you have any questions. Other tests or screenings that may be performed during your third trimester include:  Blood tests that check for low iron levels (anemia).  Fetal testing to check the health, activity level, and growth of the fetus. Testing is done if you have certain medical conditions or if there are problems during the pregnancy. FALSE LABOR You may feel small, irregular contractions that   eventually go away. These are called Braxton Hicks contractions, or false labor. Contractions may last for hours, days, or even weeks before true labor sets in. If contractions come at regular intervals, intensify, or become painful, it is best to be seen by your caregiver.  SIGNS OF LABOR   Menstrual-like cramps.  Contractions that are 5 minutes apart or less.  Contractions that start on the top of the uterus and spread down to the lower abdomen and back.  A sense of increased pelvic pressure or back  pain.  A watery or bloody mucus discharge that comes from the vagina. If you have any of these signs before the 37th week of pregnancy, call your caregiver right away. You need to go to the hospital to get checked immediately. HOME CARE INSTRUCTIONS   Avoid all smoking, herbs, alcohol, and unprescribed drugs. These chemicals affect the formation and growth of the baby.  Follow your caregiver's instructions regarding medicine use. There are medicines that are either safe or unsafe to take during pregnancy.  Exercise only as directed by your caregiver. Experiencing uterine cramps is a good sign to stop exercising.  Continue to eat regular, healthy meals.  Wear a good support bra for breast tenderness.  Do not use hot tubs, steam rooms, or saunas.  Wear your seat belt at all times when driving.  Avoid raw meat, uncooked cheese, cat litter boxes, and soil used by cats. These carry germs that can cause birth defects in the baby.  Take your prenatal vitamins.  Try taking a stool softener (if your caregiver approves) if you develop constipation. Eat more high-fiber foods, such as fresh vegetables or fruit and whole grains. Drink plenty of fluids to keep your urine clear or pale yellow.  Take warm sitz baths to soothe any pain or discomfort caused by hemorrhoids. Use hemorrhoid cream if your caregiver approves.  If you develop varicose veins, wear support hose. Elevate your feet for 15 minutes, 3-4 times a day. Limit salt in your diet.  Avoid heavy lifting, wear low heal shoes, and practice good posture.  Rest a lot with your legs elevated if you have leg cramps or low back pain.  Visit your dentist if you have not gone during your pregnancy. Use a soft toothbrush to brush your teeth and be gentle when you floss.  A sexual relationship may be continued unless your caregiver directs you otherwise.  Do not travel far distances unless it is absolutely necessary and only with the approval  of your caregiver.  Take prenatal classes to understand, practice, and ask questions about the labor and delivery.  Make a trial run to the hospital.  Pack your hospital bag.  Prepare the baby's nursery.  Continue to go to all your prenatal visits as directed by your caregiver. SEEK MEDICAL CARE IF:  You are unsure if you are in labor or if your water has broken.  You have dizziness.  You have mild pelvic cramps, pelvic pressure, or nagging pain in your abdominal area.  You have persistent nausea, vomiting, or diarrhea.  You have a bad smelling vaginal discharge.  You have pain with urination. SEEK IMMEDIATE MEDICAL CARE IF:   You have a fever.  You are leaking fluid from your vagina.  You have spotting or bleeding from your vagina.  You have severe abdominal cramping or pain.  You have rapid weight loss or gain.  You have shortness of breath with chest pain.  You notice sudden or extreme swelling   of your face, hands, ankles, feet, or legs.  You have not felt your baby move in over an hour.  You have severe headaches that do not go away with medicine.  You have vision changes. Document Released: 08/12/2001 Document Revised: 08/23/2013 Document Reviewed: 10/19/2012 [employer] Patient Information 2015 [employer]. This information is not intended to replace advice given to you by your health care provider. Make sure you discuss any questions you have with your health care provider.  

## 2014-09-01 NOTE — L&D Delivery Note (Signed)
Delivery Note Pt had SROM of clear fluid around 2300. After reaching 6cms around 0050, pt progressed rapidly to C/C/+2 with an urge to push.  After 2 pushes, at 1:10 AM a viable female was delivered via  (Presentation: ROA  ).  APGAR: 8/9 ; weight pending .  After the cord stopped pulsing, it was clamped and cut.  40 units of pitocin diluted in 1000cc LR was infused rapidly IV.  The placenta separated spontaneously and delivered via CCT and maternal pushing effort.  It was inspected and appears to be intact with a 3 VC.   Anesthesia:  epidural Episiotomy:  none Lacerations:  none Suture Repair: n/a Est. Blood Loss (mL):  250  Mom to postpartum.  Baby to Couplet care / Skin to Skin   All of the above was performed by Dr. Caroleen Hammanumley under my direct supervision  Shelly Silva,Shelly Silva 11/03/2014, 1:16 AM

## 2014-09-11 IMAGING — US US OB NUCHAL TRANSLUCENCY 1ST GEST
1 series · 13 of 28 positions shown · non-contrast
Comparison: none

[Series 1: us ob nuchal translucency 1st gest · 0.09mm/px · 13 of 30 slices shown]
[im 2/30]
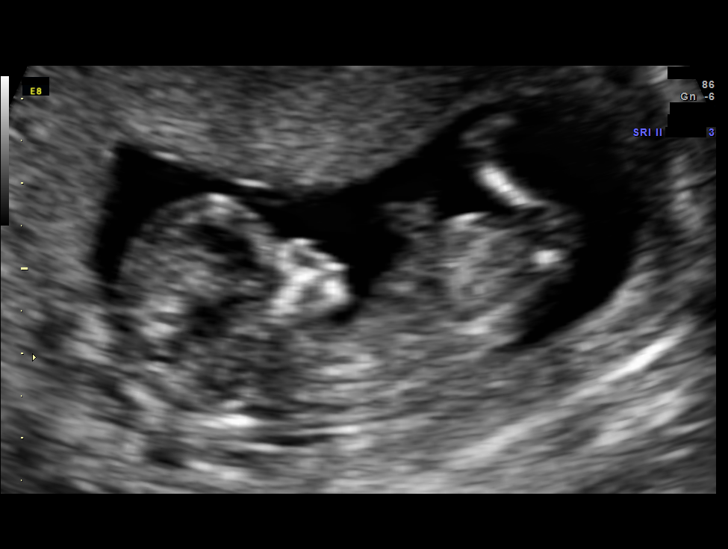
[im 4/30]
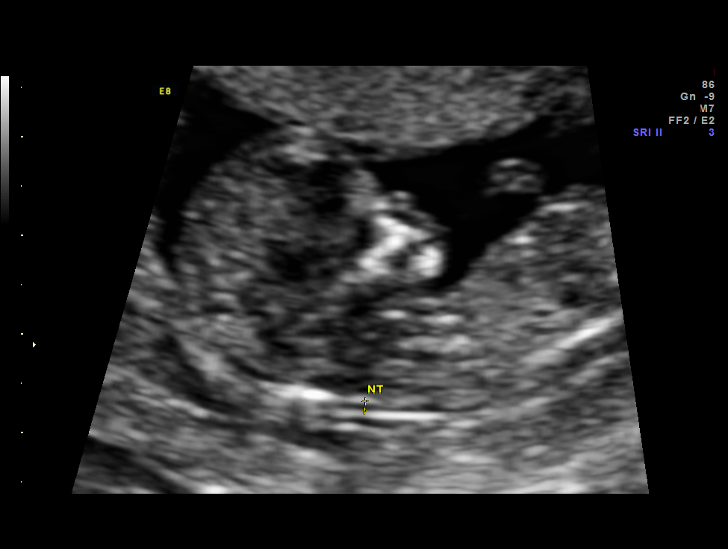
[im 6/30]
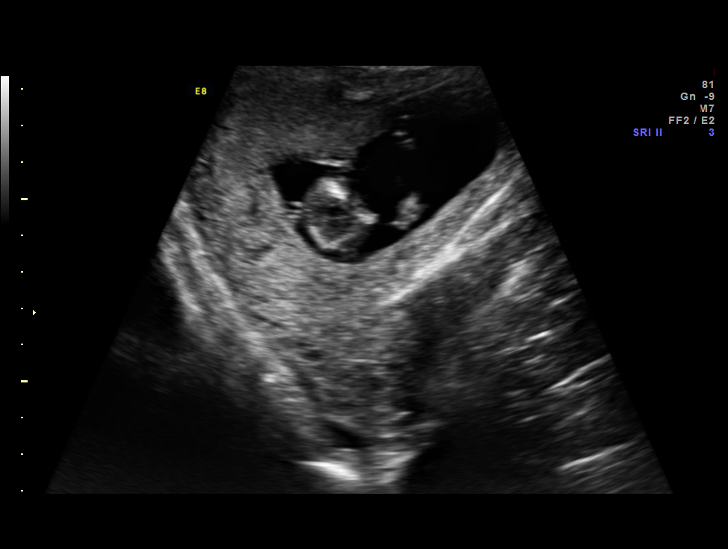
[im 8/30]
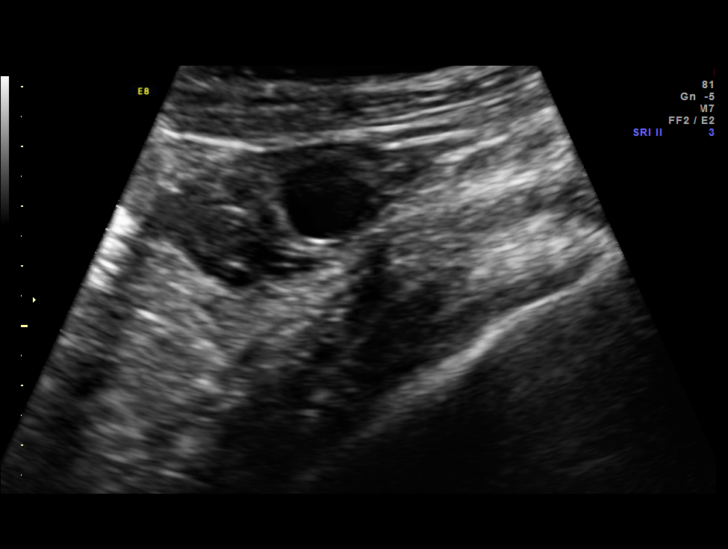
[im 10/30]
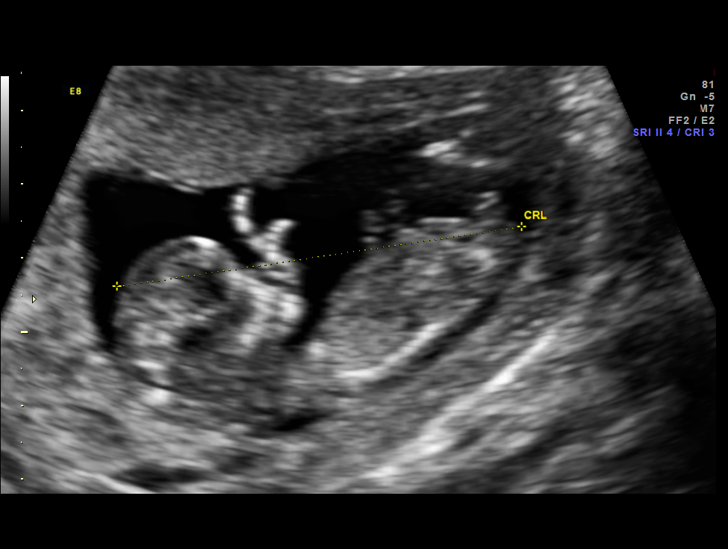
[im 12/30]
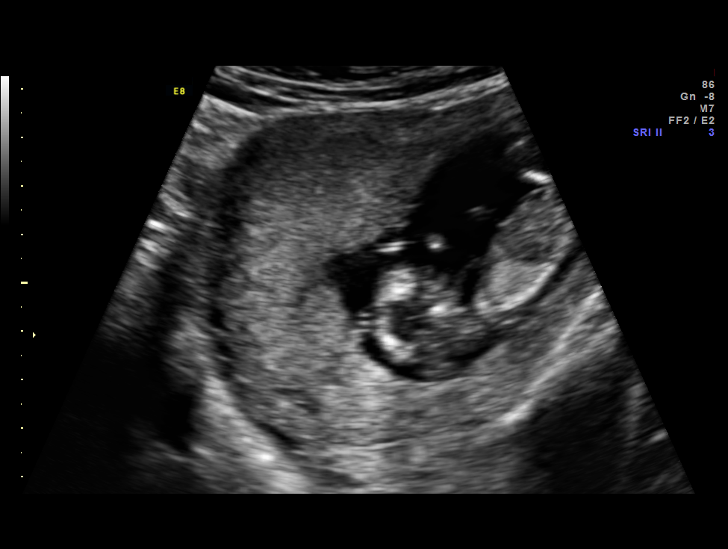
[im 16/30]
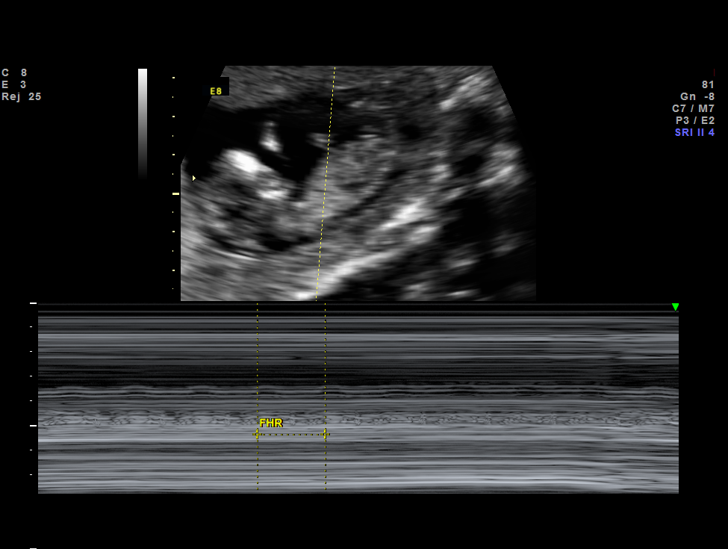
[im 18/30]
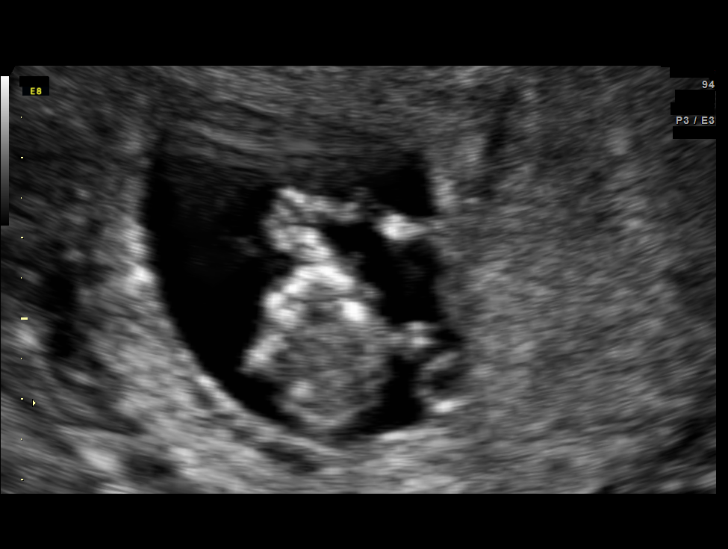
[im 20/30]
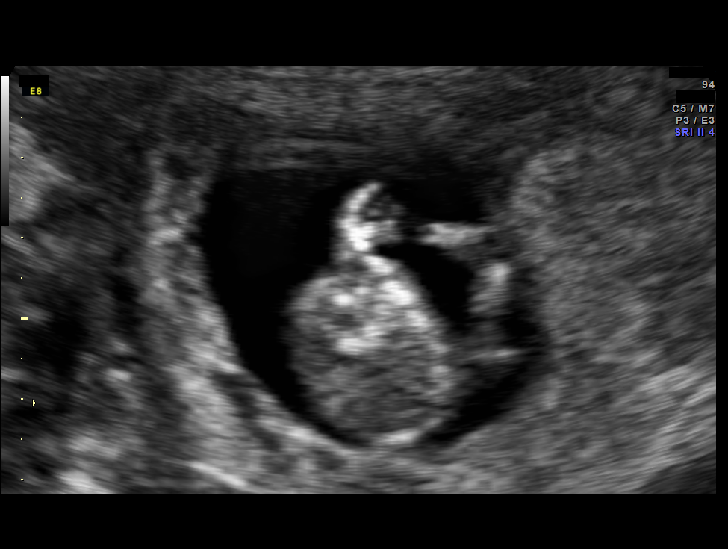
[im 22/30]
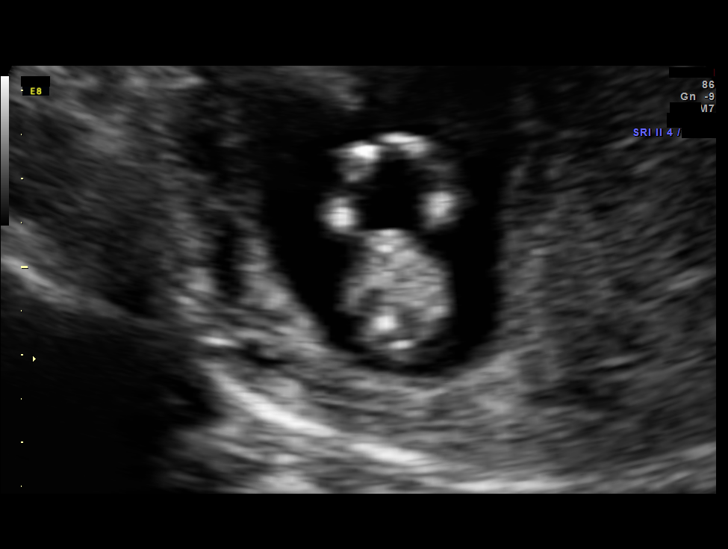
[im 24/30]
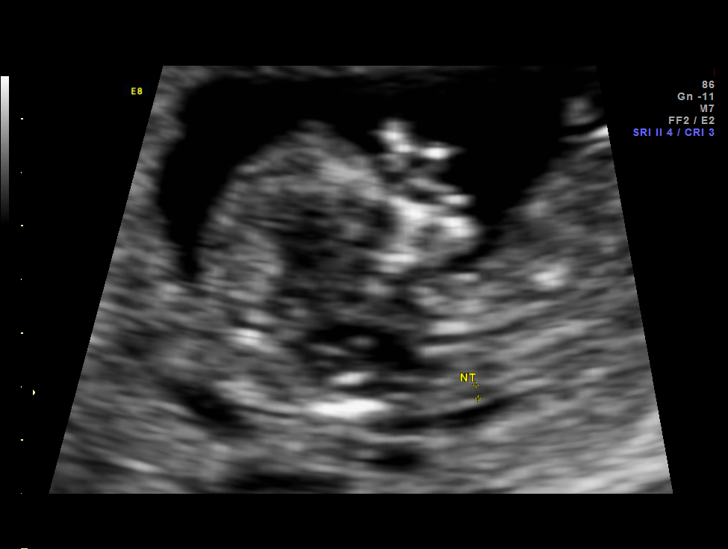
[im 26/30]
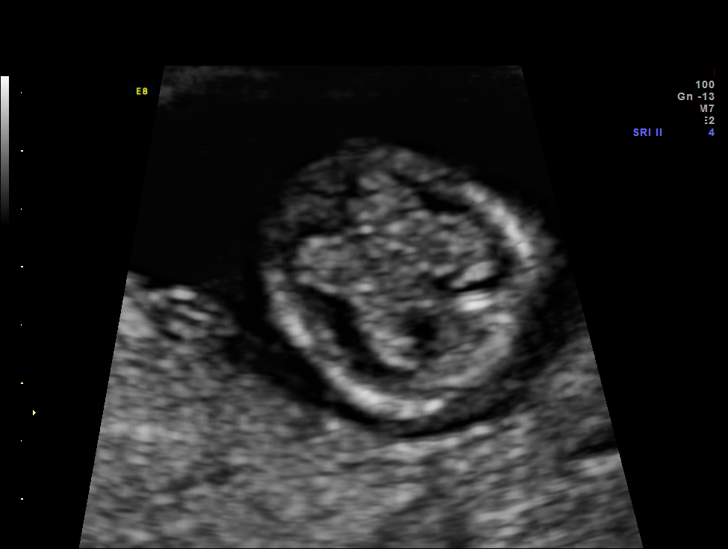
[im 28/30]
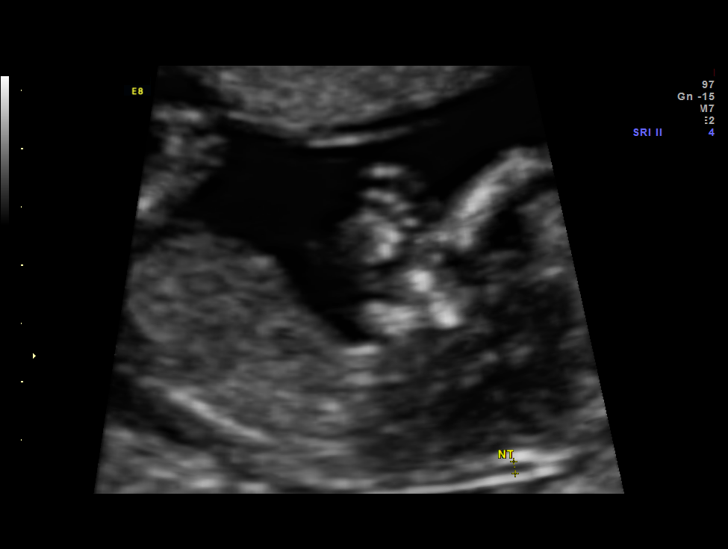

[13 of 28 positions shown; findings below may reference images not displayed]

OBSTETRICS REPORT
                      (Signed Final 04/18/2014 [DATE])

Service(s) Provided

 US FETAL NUCHAL TRANSLUCENCY                          76813.0
 MEASUREMENT
Indications

 First trimester aneuploidy screen (NT)
 Previous pre-term deliveries
Fetal Evaluation

 Num Of Fetuses:    1
 Yolk Sac:          Visualized
 Fetal Heart Rate:  148                          bpm
 Cardiac Activity:  Observed
 Presentation:      Cephalic
 Placenta:          Anterior, above cervical os
 P. Cord            Visualized
 Insertion:

 Amniotic Fluid
 AFI FV:      Subjectively within normal limits
Gestational Age

 LMP:           12w 5d        Date:  01/19/14                 EDD:   10/26/14
 Best:          12w 5d     Det. By:  LMP  (01/19/14)          EDD:   10/26/14
1st Trimester Genetic Sonogram Screening

 CRL:            55.5  mm    G. Age:   12w 0d                 EDD:   10/31/14
 Nuc Trans:       1.4  mm
 Nasal Bone:                 Present
Cervix Uterus Adnexa

 Cervix:       Normal appearance by transabdominal scan. Appears
               closed, without funnelling.
 Left Ovary:    Within normal limits.
 Right Ovary:   Not visualized. No adnexal mass visualized.
Impression

 SIUP at 12+5 weeks
 No gross abnormalities identified
 NT measurement was within normal limits for this GA; NB
 present
 Normal amniotic fluid volume
 Measurements consistent with LMP dating
Recommendations

 Offer MSAFP in the second trimester for ONTD screening
 Offer anatomy U/S by 18 weeks

## 2014-09-14 ENCOUNTER — Ambulatory Visit (INDEPENDENT_AMBULATORY_CARE_PROVIDER_SITE_OTHER): Payer: Medicaid Other | Admitting: Obstetrics & Gynecology

## 2014-09-14 VITALS — BP 110/65 | HR 80 | Temp 98.4°F | Wt 158.7 lb

## 2014-09-14 DIAGNOSIS — O09893 Supervision of other high risk pregnancies, third trimester: Secondary | ICD-10-CM

## 2014-09-14 DIAGNOSIS — O09213 Supervision of pregnancy with history of pre-term labor, third trimester: Secondary | ICD-10-CM

## 2014-09-14 LAB — POCT URINALYSIS DIP (DEVICE)
BILIRUBIN URINE: NEGATIVE
Glucose, UA: NEGATIVE mg/dL
Ketones, ur: NEGATIVE mg/dL
NITRITE: NEGATIVE
Protein, ur: NEGATIVE mg/dL
SPECIFIC GRAVITY, URINE: 1.01 (ref 1.005–1.030)
Urobilinogen, UA: 0.2 mg/dL (ref 0.0–1.0)
pH: 7 (ref 5.0–8.0)

## 2014-09-14 MED ORDER — FLUCONAZOLE 150 MG PO TABS: 150.0000 mg | ORAL_TABLET | Freq: Once | ORAL | Status: DC

## 2014-09-14 NOTE — Patient Instructions (Signed)

## 2014-09-14 NOTE — Progress Notes (Signed)
Patient reports vaginal itching/burning

## 2014-09-14 NOTE — Progress Notes (Signed)
Vaginal discharge suspects yeast. Discharge c/wmild yeast vaginitis, wet prep sent

## 2014-09-15 LAB — WET PREP, GENITAL
Clue Cells Wet Prep HPF POC: NONE SEEN
TRICH WET PREP: NONE SEEN
WBC WET PREP: NONE SEEN

## 2014-09-21 ENCOUNTER — Ambulatory Visit (INDEPENDENT_AMBULATORY_CARE_PROVIDER_SITE_OTHER): Payer: Medicaid Other | Admitting: *Deleted

## 2014-09-21 VITALS — BP 124/63 | HR 96

## 2014-09-21 DIAGNOSIS — O09893 Supervision of other high risk pregnancies, third trimester: Secondary | ICD-10-CM

## 2014-09-21 DIAGNOSIS — O09213 Supervision of pregnancy with history of pre-term labor, third trimester: Secondary | ICD-10-CM

## 2014-09-28 ENCOUNTER — Ambulatory Visit (INDEPENDENT_AMBULATORY_CARE_PROVIDER_SITE_OTHER): Payer: Medicaid Other | Admitting: Family

## 2014-09-28 VITALS — BP 106/62 | HR 85 | Temp 98.4°F | Wt 164.3 lb

## 2014-09-28 DIAGNOSIS — O98513 Other viral diseases complicating pregnancy, third trimester: Secondary | ICD-10-CM

## 2014-09-28 DIAGNOSIS — B009 Herpesviral infection, unspecified: Secondary | ICD-10-CM

## 2014-09-28 DIAGNOSIS — Z3493 Encounter for supervision of normal pregnancy, unspecified, third trimester: Secondary | ICD-10-CM

## 2014-09-28 LAB — POCT URINALYSIS DIP (DEVICE)
BILIRUBIN URINE: NEGATIVE
Glucose, UA: NEGATIVE mg/dL
Ketones, ur: NEGATIVE mg/dL
Leukocytes, UA: NEGATIVE
Nitrite: NEGATIVE
Protein, ur: 30 mg/dL — AB
Specific Gravity, Urine: 1.025 (ref 1.005–1.030)
UROBILINOGEN UA: 4 mg/dL — AB (ref 0.0–1.0)
pH: 6 (ref 5.0–8.0)

## 2014-09-28 LAB — OB RESULTS CONSOLE GC/CHLAMYDIA
CHLAMYDIA, DNA PROBE: POSITIVE
Gonorrhea: NEGATIVE

## 2014-09-28 LAB — OB RESULTS CONSOLE GBS: STREP GROUP B AG: POSITIVE

## 2014-09-28 MED ORDER — CYCLOBENZAPRINE HCL 10 MG PO TABS: 10.0000 mg | ORAL_TABLET | Freq: Every day | ORAL | Status: DC

## 2014-09-28 NOTE — Progress Notes (Signed)
No questions or concerns.  Taking valtrex as prescribed.  GBS and GC/Ct collected today.  +white vaginal discharge and itching.  Exam - vagina reddened, white discharge.  RX Diflucan, wet prep sent.  Pt declines her last 17-p injection today.

## 2014-09-28 NOTE — Progress Notes (Signed)
36 wk cultures 

## 2014-09-28 NOTE — Progress Notes (Signed)
Reports back spasms > RX Flexeril 10 mg.

## 2014-09-28 NOTE — Addendum Note (Signed)
Addended by: Marlis EdelsonKARIM, Osamah Schmader N on: 09/28/2014 01:42 PM   Modules accepted: Orders

## 2014-09-29 LAB — GC/CHLAMYDIA PROBE AMP
CT PROBE, AMP APTIMA: POSITIVE — AB
GC Probe RNA: NEGATIVE

## 2014-09-30 ENCOUNTER — Encounter: Payer: Self-pay | Admitting: Family

## 2014-09-30 DIAGNOSIS — A749 Chlamydial infection, unspecified: Secondary | ICD-10-CM | POA: Insufficient documentation

## 2014-09-30 DIAGNOSIS — O98819 Other maternal infectious and parasitic diseases complicating pregnancy, unspecified trimester: Secondary | ICD-10-CM

## 2014-09-30 LAB — CULTURE, BETA STREP (GROUP B ONLY)

## 2014-10-02 ENCOUNTER — Telehealth: Payer: Self-pay

## 2014-10-02 DIAGNOSIS — A749 Chlamydial infection, unspecified: Secondary | ICD-10-CM

## 2014-10-02 MED ORDER — AZITHROMYCIN 500 MG PO TABS: 1000.0000 mg | ORAL_TABLET | Freq: Once | ORAL | Status: DC

## 2014-10-02 NOTE — Telephone Encounter (Signed)
-----   Message from Marlis EdelsonWalidah N Karim, [place] sent at 10/01/2014  9:55 PM EST ----- Regarding: +chlamydia Please call patient and notify regarding positive chlamydia test and need for treatment and partner treatment.    ----- Message -----    From: [facility] Interface    Sent: 09/29/2014   8:48 AM      To: ZOXWRUEWalidah Kennith GainN Karim, CNM

## 2014-10-02 NOTE — Telephone Encounter (Signed)
1gm Zithromax prescibed per protocol. Called patient and informed her of results and medication at pharmacy. Also advised her partner be treated and they abstain from intercourse for at least 7 days after both are treated to prevent reinfection. Patient verbalized understanding. No questions or concerns. [facility]

## 2014-10-03 ENCOUNTER — Inpatient Hospital Stay (HOSPITAL_COMMUNITY)
Admission: AD | Admit: 2014-10-03 | Discharge: 2014-10-04 | Disposition: A | Discharge status: Home / Self Care | Payer: Medicaid Other | Source: Ambulatory Visit | Attending: Family Medicine | Admitting: Family Medicine

## 2014-10-03 DIAGNOSIS — F17210 Nicotine dependence, cigarettes, uncomplicated: Secondary | ICD-10-CM | POA: Insufficient documentation

## 2014-10-03 DIAGNOSIS — A749 Chlamydial infection, unspecified: Secondary | ICD-10-CM

## 2014-10-03 DIAGNOSIS — O9989 Other specified diseases and conditions complicating pregnancy, childbirth and the puerperium: Secondary | ICD-10-CM | POA: Insufficient documentation

## 2014-10-03 DIAGNOSIS — O99333 Smoking (tobacco) complicating pregnancy, third trimester: Secondary | ICD-10-CM | POA: Insufficient documentation

## 2014-10-03 DIAGNOSIS — Z3A36 36 weeks gestation of pregnancy: Secondary | ICD-10-CM | POA: Diagnosis not present

## 2014-10-03 DIAGNOSIS — Z3A37 37 weeks gestation of pregnancy: Secondary | ICD-10-CM | POA: Diagnosis not present

## 2014-10-03 DIAGNOSIS — R1030 Lower abdominal pain, unspecified: Secondary | ICD-10-CM | POA: Insufficient documentation

## 2014-10-03 DIAGNOSIS — O98319 Other infections with a predominantly sexual mode of transmission complicating pregnancy, unspecified trimester: Secondary | ICD-10-CM | POA: Diagnosis not present

## 2014-10-03 DIAGNOSIS — O98819 Other maternal infectious and parasitic diseases complicating pregnancy, unspecified trimester: Secondary | ICD-10-CM

## 2014-10-03 NOTE — MAU Note (Signed)
Pt reports pressure and pain in lower abd and lower back and also reports feeling like she has to bear down really hard when she has to urinate.

## 2014-10-04 ENCOUNTER — Encounter (HOSPITAL_COMMUNITY): Payer: Self-pay | Admitting: *Deleted

## 2014-10-04 DIAGNOSIS — Z3A37 37 weeks gestation of pregnancy: Secondary | ICD-10-CM | POA: Diagnosis not present

## 2014-10-04 DIAGNOSIS — A749 Chlamydial infection, unspecified: Secondary | ICD-10-CM | POA: Diagnosis not present

## 2014-10-04 DIAGNOSIS — O98319 Other infections with a predominantly sexual mode of transmission complicating pregnancy, unspecified trimester: Secondary | ICD-10-CM

## 2014-10-04 LAB — URINALYSIS, ROUTINE W REFLEX MICROSCOPIC
BILIRUBIN URINE: NEGATIVE
Glucose, UA: NEGATIVE mg/dL
Hgb urine dipstick: NEGATIVE
KETONES UR: NEGATIVE mg/dL
Leukocytes, UA: NEGATIVE
NITRITE: NEGATIVE
PH: 5.5 (ref 5.0–8.0)
PROTEIN: NEGATIVE mg/dL
Specific Gravity, Urine: 1.01 (ref 1.005–1.030)
Urobilinogen, UA: 0.2 mg/dL (ref 0.0–1.0)

## 2014-10-04 NOTE — Discharge Instructions (Signed)
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
## 2014-10-04 NOTE — Progress Notes (Signed)
Pt states she has been telling the doctors downstairs that she has been having the runs x2 wks

## 2014-10-04 NOTE — MAU Provider Note (Signed)
History     CSN: [id number]  Arrival date and time: 10/03/14 2325   None     No chief complaint on file.  HPI Patient is 34 y.o. [id number] [redacted]w[redacted]d presents for lower abdominal pressure. The pressure is worse with walking. Also c/o 'straining to pee.' Last urination was few minutes ago for urine sample. Denies hematuria, dysuria. Denies LOF, bleeding,  or decreased fetal movement. Admits to diarrhea intermittently x2 weeks only 1 BM once/day, no fever, no vomiting.  09/28/14 was evaluated for low back pain, given flexeril. States this has not helped low back pain.   Past Medical History  Diagnosis Date  . History of miscarriage   . Asthma   . Headache(784.0)   . Vaginal Pap smear, abnormal     cervical cancer  . Infection     UTI  . HSV infection     Past Surgical History  Procedure Laterality Date  . Dilatation & curettage/hysteroscopy with trueclear  2003, 2006, 2006  . Gynecologic cryosurgery  2007  . Dilation and curettage of uterus      Family History  Problem Relation Age of Onset  . Diabetes Mother   . Hearing loss Neg Hx     History  Substance Use Topics  . Smoking status: Current Some Day Smoker -- 0.25 packs/day for 4 years    Types: Cigarettes  . Smokeless tobacco: Never Used     Comment: off and on  . Alcohol Use: No    Allergies: No Known Allergies  Facility-administered medications prior to admission  Medication Dose Route Frequency Provider Last Rate Last Dose  . hydroxyprogesterone caproate (DELALUTIN) 250 mg/mL injection 250 mg  250 mg Intramuscular Weekly Aviva Signs, CNM   250 mg at 09/21/14 1139   Prescriptions prior to admission  Medication Sig Dispense Refill Last Dose  . acetaminophen (TYLENOL) 325 MG tablet Take 650 mg by mouth every 6 (six) hours as needed for mild pain.   Taking  . albuterol (PROVENTIL HFA;VENTOLIN HFA) 108 (90 BASE) MCG/ACT inhaler Inhale 2 puffs into the lungs every 6 (six) hours as needed for wheezing.   Taking   . azithromycin (ZITHROMAX) 500 MG tablet Take 2 tablets (1,000 mg total) by mouth once. 2 tablet 0   . cetirizine (ZYRTEC) 10 MG tablet Take 10 mg by mouth daily as needed for allergies.   Taking  . cyclobenzaprine (FLEXERIL) 10 MG tablet Take 1 tablet (10 mg total) by mouth at bedtime. 30 tablet 1   . fluconazole (DIFLUCAN) 150 MG tablet Take 1 tablet (150 mg total) by mouth once. (Patient not taking: Reported on 09/28/2014) 1 tablet 1 Not Taking  . valACYclovir (VALTREX) 500 MG tablet Take 1 tablet (500 mg total) by mouth 2 (two) times daily. 30 tablet 6 Taking    ROS  Negative except for HPI Physical Exam   Blood pressure 112/61, pulse 87, temperature 97.9 F (36.6 C), resp. rate 20, weight 75.864 kg (167 lb 4 oz), last menstrual period 01/19/2014, SpO2 100 %, unknown if currently breastfeeding.  Physical Exam  Constitutional: She is oriented to person, place, and time. She appears well-developed and well-nourished. No distress.  HENT:  Head: Normocephalic and atraumatic.  Respiratory: Effort normal.  Genitourinary: Vagina normal.  Cervix: 2/thick/-3   Musculoskeletal: Normal range of motion.  Neurological: She is alert and oriented to person, place, and time.  Skin: Skin is warm and dry.  Psychiatric: She has a normal mood and affect. Her behavior is  normal.    MAU Course  Procedures  NST reassuring Assessment and Plan  Patient is 34 y.o. [id number] 2781w6d reporting lower abdominal pressure likely secondary to round ligament pain vs. Pressure from baby's descent vs. Current chlamydia infection with treatment. - no cervical change so unlikely labor - urinalysis neg - fetal kick counts reinforced - preterm labor precautions   Rolm BookbinderMoss, Amber 10/04/2014, 12:19 AM   Seen also by me Agree with note Aviva SignsMarie L Williams, CNM

## 2014-10-05 ENCOUNTER — Ambulatory Visit (INDEPENDENT_AMBULATORY_CARE_PROVIDER_SITE_OTHER): Payer: Medicaid Other | Admitting: Obstetrics & Gynecology

## 2014-10-05 VITALS — BP 98/47 | HR 83 | Temp 98.2°F | Wt 163.5 lb

## 2014-10-05 DIAGNOSIS — Z3483 Encounter for supervision of other normal pregnancy, third trimester: Secondary | ICD-10-CM

## 2014-10-05 DIAGNOSIS — Z3493 Encounter for supervision of normal pregnancy, unspecified, third trimester: Secondary | ICD-10-CM

## 2014-10-05 LAB — POCT URINALYSIS DIP (DEVICE)
BILIRUBIN URINE: NEGATIVE
Glucose, UA: NEGATIVE mg/dL
KETONES UR: NEGATIVE mg/dL
Nitrite: NEGATIVE
Protein, ur: 30 mg/dL — AB
Specific Gravity, Urine: 1.025 (ref 1.005–1.030)
UROBILINOGEN UA: 1 mg/dL (ref 0.0–1.0)
pH: 7 (ref 5.0–8.0)

## 2014-10-05 NOTE — Progress Notes (Signed)
Treated for positive CT. GBS pos.

## 2014-10-05 NOTE — Progress Notes (Signed)
C/o swelling in right foot comes and goes. Denies pain in leg. States went to MAU 2/2  because has to strain to " push urine out" and still having problems with that.

## 2014-10-05 NOTE — Patient Instructions (Signed)
Third Trimester of Pregnancy The third trimester is from week 29 through week 42, months 7 through 9. The third trimester is a time when the fetus is growing rapidly. At the end of the ninth month, the fetus is about 20 inches in length and weighs 6-10 pounds.  BODY CHANGES Your body goes through many changes during pregnancy. The changes vary from woman to woman.   Your weight will continue to increase. You can expect to gain 25-35 pounds (11-16 kg) by the end of the pregnancy.  You may begin to get stretch marks on your hips, abdomen, and breasts.  You may urinate more often because the fetus is moving lower into your pelvis and pressing on your bladder.  You may develop or continue to have heartburn as a result of your pregnancy.  You may develop constipation because certain hormones are causing the muscles that push waste through your intestines to slow down.  You may develop hemorrhoids or swollen, bulging veins (varicose veins).  You may have pelvic pain because of the weight gain and pregnancy hormones relaxing your joints between the bones in your pelvis. Backaches may result from overexertion of the muscles supporting your posture.  You may have changes in your hair. These can include thickening of your hair, rapid growth, and changes in texture. Some women also have hair loss during or after pregnancy, or hair that feels dry or thin. Your hair will most likely return to normal after your baby is born.  Your breasts will continue to grow and be tender. A yellow discharge may leak from your breasts called colostrum.  Your belly button may stick out.  You may feel short of breath because of your expanding uterus.  You may notice the fetus "dropping," or moving lower in your abdomen.  You may have a bloody mucus discharge. This usually occurs a few days to a week before labor begins.  Your cervix becomes thin and soft (effaced) near your due date. WHAT TO EXPECT AT YOUR PRENATAL  EXAMS  You will have prenatal exams every 2 weeks until week 36. Then, you will have weekly prenatal exams. During a routine prenatal visit:  You will be weighed to make sure you and the fetus are growing normally.  Your blood pressure is taken.  Your abdomen will be measured to track your baby's growth.  The fetal heartbeat will be listened to.  Any test results from the previous visit will be discussed.  You may have a cervical check near your due date to see if you have effaced. At around 36 weeks, your caregiver will check your cervix. At the same time, your caregiver will also perform a test on the secretions of the vaginal tissue. This test is to determine if a type of bacteria, Group B streptococcus, is present. Your caregiver will explain this further. Your caregiver may ask you:  What your birth plan is.  How you are feeling.  If you are feeling the baby move.  If you have had any abnormal symptoms, such as leaking fluid, bleeding, severe headaches, or abdominal cramping.  If you have any questions. Other tests or screenings that may be performed during your third trimester include:  Blood tests that check for low iron levels (anemia).  Fetal testing to check the health, activity level, and growth of the fetus. Testing is done if you have certain medical conditions or if there are problems during the pregnancy. FALSE LABOR You may feel small, irregular contractions that   eventually go away. These are called Braxton Hicks contractions, or false labor. Contractions may last for hours, days, or even weeks before true labor sets in. If contractions come at regular intervals, intensify, or become painful, it is best to be seen by your caregiver.  SIGNS OF LABOR   Menstrual-like cramps.  Contractions that are 5 minutes apart or less.  Contractions that start on the top of the uterus and spread down to the lower abdomen and back.  A sense of increased pelvic pressure or back  pain.  A watery or bloody mucus discharge that comes from the vagina. If you have any of these signs before the 37th week of pregnancy, call your caregiver right away. You need to go to the hospital to get checked immediately. HOME CARE INSTRUCTIONS   Avoid all smoking, herbs, alcohol, and unprescribed drugs. These chemicals affect the formation and growth of the baby.  Follow your caregiver's instructions regarding medicine use. There are medicines that are either safe or unsafe to take during pregnancy.  Exercise only as directed by your caregiver. Experiencing uterine cramps is a good sign to stop exercising.  Continue to eat regular, healthy meals.  Wear a good support bra for breast tenderness.  Do not use hot tubs, steam rooms, or saunas.  Wear your seat belt at all times when driving.  Avoid raw meat, uncooked cheese, cat litter boxes, and soil used by cats. These carry germs that can cause birth defects in the baby.  Take your prenatal vitamins.  Try taking a stool softener (if your caregiver approves) if you develop constipation. Eat more high-fiber foods, such as fresh vegetables or fruit and whole grains. Drink plenty of fluids to keep your urine clear or pale yellow.  Take warm sitz baths to soothe any pain or discomfort caused by hemorrhoids. Use hemorrhoid cream if your caregiver approves.  If you develop varicose veins, wear support hose. Elevate your feet for 15 minutes, 3-4 times a day. Limit salt in your diet.  Avoid heavy lifting, wear low heal shoes, and practice good posture.  Rest a lot with your legs elevated if you have leg cramps or low back pain.  Visit your dentist if you have not gone during your pregnancy. Use a soft toothbrush to brush your teeth and be gentle when you floss.  A sexual relationship may be continued unless your caregiver directs you otherwise.  Do not travel far distances unless it is absolutely necessary and only with the approval  of your caregiver.  Take prenatal classes to understand, practice, and ask questions about the labor and delivery.  Make a trial run to the hospital.  Pack your hospital bag.  Prepare the baby's nursery.  Continue to go to all your prenatal visits as directed by your caregiver. SEEK MEDICAL CARE IF:  You are unsure if you are in labor or if your water has broken.  You have dizziness.  You have mild pelvic cramps, pelvic pressure, or nagging pain in your abdominal area.  You have persistent nausea, vomiting, or diarrhea.  You have a bad smelling vaginal discharge.  You have pain with urination. SEEK IMMEDIATE MEDICAL CARE IF:   You have a fever.  You are leaking fluid from your vagina.  You have spotting or bleeding from your vagina.  You have severe abdominal cramping or pain.  You have rapid weight loss or gain.  You have shortness of breath with chest pain.  You notice sudden or extreme swelling   of your face, hands, ankles, feet, or legs.  You have not felt your baby move in over an hour.  You have severe headaches that do not go away with medicine.  You have vision changes. Document Released: 08/12/2001 Document Revised: 08/23/2013 Document Reviewed: 10/19/2012 [employer] Patient Information 2015 [employer]. This information is not intended to replace advice given to you by your health care provider. Make sure you discuss any questions you have with your health care provider.  

## 2014-10-12 ENCOUNTER — Ambulatory Visit (INDEPENDENT_AMBULATORY_CARE_PROVIDER_SITE_OTHER): Payer: Medicaid Other | Admitting: Obstetrics and Gynecology

## 2014-10-12 ENCOUNTER — Encounter: Payer: Self-pay | Admitting: Obstetrics and Gynecology

## 2014-10-12 VITALS — BP 115/66 | HR 83 | Wt 165.2 lb

## 2014-10-12 DIAGNOSIS — O0943 Supervision of pregnancy with grand multiparity, third trimester: Secondary | ICD-10-CM

## 2014-10-12 DIAGNOSIS — Z3483 Encounter for supervision of other normal pregnancy, third trimester: Secondary | ICD-10-CM

## 2014-10-12 DIAGNOSIS — A749 Chlamydial infection, unspecified: Secondary | ICD-10-CM

## 2014-10-12 DIAGNOSIS — O98513 Other viral diseases complicating pregnancy, third trimester: Secondary | ICD-10-CM

## 2014-10-12 DIAGNOSIS — Z3493 Encounter for supervision of normal pregnancy, unspecified, third trimester: Secondary | ICD-10-CM

## 2014-10-12 DIAGNOSIS — O09893 Supervision of other high risk pregnancies, third trimester: Secondary | ICD-10-CM

## 2014-10-12 DIAGNOSIS — O98819 Other maternal infectious and parasitic diseases complicating pregnancy, unspecified trimester: Secondary | ICD-10-CM

## 2014-10-12 DIAGNOSIS — O09213 Supervision of pregnancy with history of pre-term labor, third trimester: Secondary | ICD-10-CM

## 2014-10-12 DIAGNOSIS — B009 Herpesviral infection, unspecified: Secondary | ICD-10-CM

## 2014-10-12 DIAGNOSIS — O98319 Other infections with a predominantly sexual mode of transmission complicating pregnancy, unspecified trimester: Secondary | ICD-10-CM

## 2014-10-12 LAB — POCT URINALYSIS DIP (DEVICE)
Bilirubin Urine: NEGATIVE
Glucose, UA: NEGATIVE mg/dL
Hgb urine dipstick: NEGATIVE
KETONES UR: NEGATIVE mg/dL
LEUKOCYTES UA: NEGATIVE
Nitrite: NEGATIVE
Protein, ur: NEGATIVE mg/dL
Specific Gravity, Urine: 1.025 (ref 1.005–1.030)
Urobilinogen, UA: 1 mg/dL (ref 0.0–1.0)
pH: 7 (ref 5.0–8.0)

## 2014-10-12 NOTE — Progress Notes (Signed)
Patient is doing well without complaints. FM/labor precautions reviewed. Continue Valtrex

## 2014-10-19 ENCOUNTER — Ambulatory Visit (INDEPENDENT_AMBULATORY_CARE_PROVIDER_SITE_OTHER): Payer: Medicaid Other | Admitting: Family Medicine

## 2014-10-19 VITALS — BP 120/64 | HR 89 | Temp 98.2°F | Wt 165.4 lb

## 2014-10-19 DIAGNOSIS — Z3493 Encounter for supervision of normal pregnancy, unspecified, third trimester: Secondary | ICD-10-CM

## 2014-10-19 DIAGNOSIS — Z3483 Encounter for supervision of other normal pregnancy, third trimester: Secondary | ICD-10-CM

## 2014-10-19 LAB — POCT URINALYSIS DIP (DEVICE)
BILIRUBIN URINE: NEGATIVE
Glucose, UA: NEGATIVE mg/dL
KETONES UR: NEGATIVE mg/dL
LEUKOCYTES UA: NEGATIVE
NITRITE: NEGATIVE
Protein, ur: NEGATIVE mg/dL
Specific Gravity, Urine: 1.01 (ref 1.005–1.030)
Urobilinogen, UA: 1 mg/dL (ref 0.0–1.0)
pH: 7 (ref 5.0–8.0)

## 2014-10-19 MED ORDER — ZOLPIDEM TARTRATE 5 MG PO TABS: 5.0000 mg | ORAL_TABLET | Freq: Every evening | ORAL | Status: DC | PRN

## 2014-10-19 NOTE — Progress Notes (Signed)
Pt reports having swelling just on right leg, calf will become swollen and will have pain; pt states its off/on

## 2014-10-19 NOTE — Progress Notes (Signed)
Called Ambien in to patient's pharmacy.

## 2014-10-19 NOTE — Progress Notes (Signed)
Patient is 34 y.o. [id number] 5788w0d.  +FM, denies LOF, VB, vaginal discharge. +contractions, irregular and not consistent - +chlamydia, tested at 37w => TOC at next visit - RLE cramping with some swelling, worse at night => no edema, no pain, no signs of DVT - having difficulty sleeping at night => rx ambien #7, no RF - still taking valtrex => continue

## 2014-10-26 ENCOUNTER — Ambulatory Visit (INDEPENDENT_AMBULATORY_CARE_PROVIDER_SITE_OTHER): Payer: Medicaid Other | Admitting: Obstetrics and Gynecology

## 2014-10-26 ENCOUNTER — Encounter: Payer: Self-pay | Admitting: Obstetrics and Gynecology

## 2014-10-26 VITALS — BP 118/67 | HR 90 | Temp 98.3°F | Wt 167.2 lb

## 2014-10-26 DIAGNOSIS — O09893 Supervision of other high risk pregnancies, third trimester: Secondary | ICD-10-CM

## 2014-10-26 DIAGNOSIS — O09213 Supervision of pregnancy with history of pre-term labor, third trimester: Secondary | ICD-10-CM

## 2014-10-26 DIAGNOSIS — Z3493 Encounter for supervision of normal pregnancy, unspecified, third trimester: Secondary | ICD-10-CM

## 2014-10-26 DIAGNOSIS — Z113 Encounter for screening for infections with a predominantly sexual mode of transmission: Secondary | ICD-10-CM

## 2014-10-26 DIAGNOSIS — Z118 Encounter for screening for other infectious and parasitic diseases: Secondary | ICD-10-CM

## 2014-10-26 DIAGNOSIS — Z3483 Encounter for supervision of other normal pregnancy, third trimester: Secondary | ICD-10-CM

## 2014-10-26 DIAGNOSIS — O98819 Other maternal infectious and parasitic diseases complicating pregnancy, unspecified trimester: Secondary | ICD-10-CM

## 2014-10-26 DIAGNOSIS — O0943 Supervision of pregnancy with grand multiparity, third trimester: Secondary | ICD-10-CM

## 2014-10-26 DIAGNOSIS — B009 Herpesviral infection, unspecified: Secondary | ICD-10-CM

## 2014-10-26 DIAGNOSIS — A749 Chlamydial infection, unspecified: Secondary | ICD-10-CM

## 2014-10-26 DIAGNOSIS — O98513 Other viral diseases complicating pregnancy, third trimester: Secondary | ICD-10-CM

## 2014-10-26 DIAGNOSIS — O98319 Other infections with a predominantly sexual mode of transmission complicating pregnancy, unspecified trimester: Secondary | ICD-10-CM

## 2014-10-26 LAB — POCT URINALYSIS DIP (DEVICE)
Bilirubin Urine: NEGATIVE
Glucose, UA: NEGATIVE mg/dL
KETONES UR: NEGATIVE mg/dL
Nitrite: NEGATIVE
PH: 7 (ref 5.0–8.0)
PROTEIN: NEGATIVE mg/dL
Specific Gravity, Urine: 1.02 (ref 1.005–1.030)
UROBILINOGEN UA: 1 mg/dL (ref 0.0–1.0)

## 2014-10-26 LAB — OB RESULTS CONSOLE GC/CHLAMYDIA
Chlamydia: NEGATIVE
Gonorrhea: NEGATIVE

## 2014-10-26 NOTE — Progress Notes (Signed)
Patient is doing well without complaints. Will schedule IOL at 41 weeks on 3/3 and postdate fetal testing on 2/29. Test of cure today. Continue valtrex

## 2014-10-26 NOTE — Progress Notes (Signed)
Pain-ligament  

## 2014-10-26 NOTE — Progress Notes (Signed)
IOL 11/02/14 @ 730a. Pt states she already has medicaid transportation set up for 10/30/14, states she does not need medicaid transportation for 11/02/14 due to having an am induction.

## 2014-10-27 LAB — GC/CHLAMYDIA PROBE AMP
CT PROBE, AMP APTIMA: NEGATIVE
GC Probe RNA: NEGATIVE

## 2014-10-30 ENCOUNTER — Ambulatory Visit (INDEPENDENT_AMBULATORY_CARE_PROVIDER_SITE_OTHER): Payer: Medicaid Other | Admitting: *Deleted

## 2014-10-30 VITALS — BP 107/57 | HR 84

## 2014-10-30 DIAGNOSIS — O480 Post-term pregnancy: Secondary | ICD-10-CM

## 2014-10-30 LAB — US OB FOLLOW UP

## 2014-11-01 ENCOUNTER — Encounter (HOSPITAL_COMMUNITY): Payer: Self-pay | Admitting: *Deleted

## 2014-11-01 ENCOUNTER — Telehealth (HOSPITAL_COMMUNITY): Payer: Self-pay | Admitting: *Deleted

## 2014-11-01 NOTE — Progress Notes (Signed)
2/29 NST reviewed and reactive 

## 2014-11-01 NOTE — Telephone Encounter (Signed)
Preadmission screen  

## 2014-11-02 ENCOUNTER — Encounter (HOSPITAL_COMMUNITY): Payer: Self-pay

## 2014-11-02 ENCOUNTER — Inpatient Hospital Stay (HOSPITAL_COMMUNITY)
Admission: RE | Admit: 2014-11-02 | Discharge: 2014-11-04 | DRG: 767 | Disposition: A | Discharge status: Home / Self Care | Payer: Medicaid Other | Source: Ambulatory Visit | Attending: Obstetrics & Gynecology | Admitting: Obstetrics & Gynecology

## 2014-11-02 ENCOUNTER — Inpatient Hospital Stay (HOSPITAL_COMMUNITY): Payer: Medicaid Other | Admitting: Anesthesiology

## 2014-11-02 DIAGNOSIS — Z3A40 40 weeks gestation of pregnancy: Secondary | ICD-10-CM | POA: Diagnosis present

## 2014-11-02 DIAGNOSIS — Z302 Encounter for sterilization: Secondary | ICD-10-CM

## 2014-11-02 DIAGNOSIS — O99824 Streptococcus B carrier state complicating childbirth: Secondary | ICD-10-CM | POA: Diagnosis present

## 2014-11-02 DIAGNOSIS — O480 Post-term pregnancy: Principal | ICD-10-CM | POA: Diagnosis present

## 2014-11-02 DIAGNOSIS — O0943 Supervision of pregnancy with grand multiparity, third trimester: Secondary | ICD-10-CM | POA: Diagnosis not present

## 2014-11-02 DIAGNOSIS — O321XX0 Maternal care for breech presentation, not applicable or unspecified: Secondary | ICD-10-CM | POA: Diagnosis present

## 2014-11-02 DIAGNOSIS — Z3490 Encounter for supervision of normal pregnancy, unspecified, unspecified trimester: Secondary | ICD-10-CM

## 2014-11-02 DIAGNOSIS — O321XX1 Maternal care for breech presentation, fetus 1: Secondary | ICD-10-CM

## 2014-11-02 DIAGNOSIS — Z3403 Encounter for supervision of normal first pregnancy, third trimester: Secondary | ICD-10-CM | POA: Diagnosis present

## 2014-11-02 LAB — CBC
HCT: 31.9 % — ABNORMAL LOW (ref 36.0–46.0)
Hemoglobin: 10.6 g/dL — ABNORMAL LOW (ref 12.0–15.0)
MCH: 29 pg (ref 26.0–34.0)
MCHC: 33.2 g/dL (ref 30.0–36.0)
MCV: 87.2 fL (ref 78.0–100.0)
PLATELETS: 223 10*3/uL (ref 150–400)
RBC: 3.66 MIL/uL — AB (ref 3.87–5.11)
RDW: 14.2 % (ref 11.5–15.5)
WBC: 7.2 10*3/uL (ref 4.0–10.5)

## 2014-11-02 MED ORDER — TERBUTALINE SULFATE 1 MG/ML IJ SOLN
0.2500 mg | Freq: Once | INTRAMUSCULAR | Status: AC | PRN
  Administered 2014-11-02: 0.25 mg via SUBCUTANEOUS
  Filled 2014-11-02: qty 1

## 2014-11-02 MED ORDER — OXYCODONE-ACETAMINOPHEN 5-325 MG PO TABS: 2.0000 | ORAL_TABLET | ORAL | Status: DC | PRN

## 2014-11-02 MED ORDER — PHENYLEPHRINE 40 MCG/ML (10ML) SYRINGE FOR IV PUSH (FOR BLOOD PRESSURE SUPPORT)
80.0000 ug | PREFILLED_SYRINGE | INTRAVENOUS | Status: DC | PRN
  Filled 2014-11-02: qty 20
  Filled 2014-11-02: qty 2

## 2014-11-02 MED ORDER — EPHEDRINE 5 MG/ML INJ
10.0000 mg | INTRAVENOUS | Status: DC | PRN
  Filled 2014-11-02: qty 2

## 2014-11-02 MED ORDER — LIDOCAINE HCL (PF) 1 % IJ SOLN
30.0000 mL | INTRAMUSCULAR | Status: DC | PRN
  Filled 2014-11-02: qty 30

## 2014-11-02 MED ORDER — PHENYLEPHRINE 40 MCG/ML (10ML) SYRINGE FOR IV PUSH (FOR BLOOD PRESSURE SUPPORT)
80.0000 ug | PREFILLED_SYRINGE | INTRAVENOUS | Status: DC | PRN
  Filled 2014-11-02: qty 2

## 2014-11-02 MED ORDER — MISOPROSTOL 25 MCG QUARTER TABLET
25.0000 ug | ORAL_TABLET | ORAL | Status: DC | PRN
  Filled 2014-11-02: qty 0.25

## 2014-11-02 MED ORDER — FENTANYL 2.5 MCG/ML BUPIVACAINE 1/10 % EPIDURAL INFUSION (WH - ANES)
14.0000 mL/h | INTRAMUSCULAR | Status: DC | PRN
  Administered 2014-11-03: 14 mL/h via EPIDURAL
  Filled 2014-11-02: qty 125

## 2014-11-02 MED ORDER — OXYTOCIN 40 UNITS IN LACTATED RINGERS INFUSION - SIMPLE MED: 62.5000 mL/h | INTRAVENOUS | Status: DC

## 2014-11-02 MED ORDER — ACETAMINOPHEN 325 MG PO TABS: 650.0000 mg | ORAL_TABLET | ORAL | Status: DC | PRN

## 2014-11-02 MED ORDER — OXYTOCIN BOLUS FROM INFUSION
500.0000 mL | INTRAVENOUS | Status: DC
  Administered 2014-11-03: 500 mL via INTRAVENOUS

## 2014-11-02 MED ORDER — TERBUTALINE SULFATE 1 MG/ML IJ SOLN: 0.2500 mg | Freq: Once | INTRAMUSCULAR | Status: AC | PRN

## 2014-11-02 MED ORDER — LACTATED RINGERS IV SOLN
INTRAVENOUS | Status: DC
  Administered 2014-11-02 (×3): via INTRAVENOUS

## 2014-11-02 MED ORDER — PENICILLIN G POTASSIUM 5000000 UNITS IJ SOLR
2.5000 10*6.[IU] | INTRAVENOUS | Status: DC
  Administered 2014-11-02 (×3): 2.5 10*6.[IU] via INTRAVENOUS
  Filled 2014-11-02 (×8): qty 2.5

## 2014-11-02 MED ORDER — OXYCODONE-ACETAMINOPHEN 5-325 MG PO TABS: 1.0000 | ORAL_TABLET | ORAL | Status: DC | PRN

## 2014-11-02 MED ORDER — CITRIC ACID-SODIUM CITRATE 334-500 MG/5ML PO SOLN: 30.0000 mL | ORAL | Status: DC | PRN

## 2014-11-02 MED ORDER — FENTANYL CITRATE 0.05 MG/ML IJ SOLN
100.0000 ug | INTRAMUSCULAR | Status: DC | PRN
  Administered 2014-11-02 (×3): 100 ug via INTRAVENOUS
  Filled 2014-11-02 (×3): qty 2

## 2014-11-02 MED ORDER — FENTANYL 2.5 MCG/ML BUPIVACAINE 1/10 % EPIDURAL INFUSION (WH - ANES): 14.0000 mL/h | INTRAMUSCULAR | Status: DC | PRN

## 2014-11-02 MED ORDER — FLEET ENEMA 7-19 GM/118ML RE ENEM: 1.0000 | ENEMA | RECTAL | Status: DC | PRN

## 2014-11-02 MED ORDER — LACTATED RINGERS IV SOLN: 500.0000 mL | INTRAVENOUS | Status: DC | PRN

## 2014-11-02 MED ORDER — PENICILLIN G POTASSIUM 5000000 UNITS IJ SOLR
5.0000 10*6.[IU] | Freq: Once | INTRAVENOUS | Status: AC
  Administered 2014-11-02: 5 10*6.[IU] via INTRAVENOUS
  Filled 2014-11-02: qty 5

## 2014-11-02 MED ORDER — DIPHENHYDRAMINE HCL 50 MG/ML IJ SOLN: 12.5000 mg | INTRAMUSCULAR | Status: DC | PRN

## 2014-11-02 MED ORDER — LACTATED RINGERS IV SOLN
500.0000 mL | Freq: Once | INTRAVENOUS | Status: AC
  Administered 2014-11-02: 500 mL via INTRAVENOUS

## 2014-11-02 MED ORDER — ONDANSETRON HCL 4 MG/2ML IJ SOLN: 4.0000 mg | Freq: Four times a day (QID) | INTRAMUSCULAR | Status: DC | PRN

## 2014-11-02 MED ORDER — OXYTOCIN 40 UNITS IN LACTATED RINGERS INFUSION - SIMPLE MED
1.0000 m[IU]/min | INTRAVENOUS | Status: DC
  Administered 2014-11-02: 2 m[IU]/min via INTRAVENOUS
  Filled 2014-11-02: qty 1000

## 2014-11-02 NOTE — H&P (Signed)
Shelly Silva is a 34 y.o. female [id number] at [redacted]w[redacted]d presenting for induction of labor for postdates. All previous vaginal deliveries.  History of HSV on valtrex, GBS+.  Plan for BTL following delivery.  Plans to bottlefeed.    Clinic [facility]  Dating 6 wk ultrasound  Genetic Screen 1 Screen: NT nml AFP: Negative Quad: NIPS:  Anatomic [place] Normal, Low Lying Placenta, 1.4cm from os, resolved at 28 weeks  GTT Third trimester: 129  TDaP vaccine 08/03/14  Flu vaccine 06/28/14  GBS pos  Contraception BTL, papers signed 07/06/14  Baby Food Bottle  Pediatrician [facility]  Support Person Family  Pap neg; GC/CT negx2     Maternal Medical History:  Reason for admission: Nausea.    OB History    Gravida Para Term Preterm AB TAB SAB Ectopic Multiple Living   Past Medical History  Diagnosis Date  . History of miscarriage   . Asthma   . Headache(784.0)   . Vaginal Pap smear, abnormal     cervical cancer  . Infection     UTI  . HSV infection    Past Surgical History  Procedure Laterality Date  . Dilatation & curettage/hysteroscopy with trueclear  2003, 2006, 2006  . Gynecologic cryosurgery  2007  . Dilation and curettage of uterus     Family History: family history includes Diabetes in her mother. There is no history of Hearing loss. Social History:  reports that she has been smoking Cigarettes.  She has a 1 pack-year smoking history. She has never used smokeless tobacco. She reports that she does not drink alcohol or use illicit drugs.   Prenatal Transfer Tool  Maternal Diabetes: No Genetic Screening: Normal Maternal Ultrasounds/Referrals: Abnormal:  Findings:   Other:Low lying placenta resolved at 28 wks Fetal Ultrasounds or other Referrals:  None Maternal Substance Abuse:  No Significant Maternal Medications:  None Significant Maternal Lab Results:  None Other Comments:  None  Review of Systems   Constitutional: Negative for fever and chills.  Eyes: Negative for blurred vision, double vision and pain.  Respiratory: Negative for cough and shortness of breath.   Cardiovascular: Negative for chest pain and palpitations.  Gastrointestinal: Negative for heartburn, nausea and vomiting.  Genitourinary: Negative for dysuria and hematuria.  Musculoskeletal: Negative for myalgias.  Skin: Negative for rash.  Neurological: Negative for dizziness, loss of consciousness and headaches.     Exam Physical Exam  Constitutional: She is oriented to person, place, and time. She appears well-developed and well-nourished.  HENT:  Head: Normocephalic and atraumatic.  Eyes: Conjunctivae and EOM are normal. Pupils are equal, round, and reactive to light.  Neck: Normal range of motion.  Cardiovascular: Normal rate, regular rhythm, normal heart sounds and intact distal pulses.   Respiratory: Effort normal and breath sounds normal.  GI: Soft. Bowel sounds are normal.  Genitourinary: Vagina normal and uterus normal.  Musculoskeletal: Normal range of motion.  Neurological: She is alert and oriented to person, place, and time.  Skin: Skin is warm and dry.  Psychiatric: She has a normal mood and affect. Her behavior is normal.  Cervical exam: 3/60/-1 Baby breech position on initial U/S, external version by Dr Shelly Silva  Prenatal labs: ABO, Rh: --/--/A POS (10/29 1655) Antibody: NEG (10/29 1655) Rubella: 10.40 (08/05 1556) RPR: NON REAC (12/03 1419)  HBsAg: NEGATIVE (08/05 1556)  HIV: NONREACTIVE (12/03 1419)  GBS: Positive (01/28 0000)  Assessment/Plan: A: 34 y.o. [id number] at 4161w0d here for induction of labor     HSV on valtrex     GBS+     S/P successful external version by Shelly Silva  P: Induction of labor with pitocin     IV fentanyl, epidural as indicated     Anticipate NSVD   Silva,Shelly 11/02/2014, 10:25 AM   I have seen this patient and agree with the above resident's  note.  Shelly Silva Certified Nurse-Midwife

## 2014-11-02 NOTE — Progress Notes (Signed)
This note also relates to the following rows which could not be included: Dose (milli-units/min) Oxytocin - Cannot attach notes to extension rows Rate (mL/hr) Oxytocin - Cannot attach notes to extension rows Concentration Oxytocin - Cannot attach notes to extension rows   Plan of care is to continue increasing pitocin past 20 since patient reports UCs are increasing in intensity. Patient agrees with plan, does not want to stop pitocin to eat dinner.

## 2014-11-02 NOTE — Anesthesia Preprocedure Evaluation (Signed)
Anesthesia Evaluation  Patient identified by MRN, date of birth, ID band Patient awake    Reviewed: Allergy & Precautions, H&P , Patient's Chart, lab work & pertinent test results  Airway Mallampati: II TM Distance: >3 FB Neck ROM: full    Dental  (+) Teeth Intact   Pulmonary asthma , Current Smoker,  breath sounds clear to auscultation        Cardiovascular Rhythm:regular Rate:Normal     Neuro/Psych    GI/Hepatic   Endo/Other    Renal/GU      Musculoskeletal   Abdominal   Peds  Hematology   Anesthesia Other Findings       Reproductive/Obstetrics (+) Pregnancy                           Anesthesia Physical Anesthesia Plan  ASA: II  Anesthesia Plan: Epidural   Post-op Pain Management:    Induction:   Airway Management Planned:   Additional Equipment:   Intra-op Plan:   Post-operative Plan:   Informed Consent: I have reviewed the patients History and Physical, chart, labs and discussed the procedure including the risks, benefits and alternatives for the proposed anesthesia with the patient or authorized representative who has indicated his/her understanding and acceptance.   Dental Advisory Given  Plan Discussed with:   Anesthesia Plan Comments: (Labs checked- platelets confirmed with RN in room. Fetal heart tracing, per RN, reported to be stable enough for sitting procedure. Discussed epidural, and patient consents to the procedure:  included risk of possible headache,backache, failed block, allergic reaction, and nerve injury. This patient was asked if she had any questions or concerns before the procedure started.)        Anesthesia Quick Evaluation  

## 2014-11-02 NOTE — Progress Notes (Signed)
   Katy ApoMarkesha C Watts is a 34 y.o. [id number] at 7158w0d  admitted for induction of labor due to Post dates. .  Subjective: Contractions are getting stronger  Objective: Filed Vitals:   11/02/14 2102 11/02/14 2142 11/02/14 2153 11/02/14 2229  BP: 105/66  105/35   Pulse: 60  65   Temp:      TempSrc:      Resp: 18 18 18 18   Height:      Weight:          FHT:  FHR: 130 bpm, variability: moderate,  accelerations:  Present,  decelerations:  Absent UC:   irregular, every 2-3 minutes SVE:   Dilation: 3.5 Effacement (%): 70 Station: -2 Exam by:: n licato rn Pitocin @ 26 mu/min  Labs: Lab Results  Component Value Date   WBC 7.2 11/02/2014   HGB 10.6* 11/02/2014   HCT 31.9* 11/02/2014   MCV 87.2 11/02/2014   PLT 223 11/02/2014    Assessment / Plan: Induction of labor due to postterm,  progressing well on pitocin  Labor: seems to be progressing now Fetal Wellbeing:  Category I Pain Control:  Fentanyl Anticipated MOD:  NSVD  CRESENZO-DISHMAN,Joshuwa Vecchio 11/02/2014, 10:54 PM

## 2014-11-02 NOTE — Progress Notes (Signed)
Shelly ApoMarkesha C Silva is a 34 y.o. [id number] at 4188w0d by ultrasound admitted for induction of labor due to Post dates. Pt was noted to be breech on initial exam. She is now s/p a successful ECV. With a binder in place.    Subjective: Pt with no complaints currently.  Objective: BP 104/63 mmHg  Pulse 72  Temp(Src) 98.3 F (36.8 C) (Oral)  Resp 20  Ht 5\' 2"  (1.575 m)  Wt 167 lb (75.751 kg)  BMI 30.54 kg/m2  LMP 01/19/2014      FHT:  FHR: 120's bpm, variability: moderate,  accelerations:  Present,  decelerations:  Absent UC:   Irregular SVE:  3cm/60%/vertex/ ballotable Labs: Lab Results  Component Value Date   WBC 7.2 11/02/2014   HGB 10.6* 11/02/2014   HCT 31.9* 11/02/2014   MCV 87.2 11/02/2014   PLT 223 11/02/2014    Assessment / Plan: Induction of labor due to post dates Begin Pitocin indxn Anticipate SVD  HARRAWAY-SMITH, Martin Smeal 11/02/2014, 11:06 AM

## 2014-11-02 NOTE — Procedures (Signed)
After informed verbal consent, Terbutaline 0.25 mg SQ given, ECV was attempted under Ultrasound guidance.  The version was successful.   FHR was reactive before and after the procedure.   Pt. Tolerated the procedure well. Will proceed with Pitocin induction.  Garion Wempe L. Harraway-Smith, M.D., Evern CoreFACOG

## 2014-11-02 NOTE — Progress Notes (Signed)
   Shelly Silva is a 34 y.o. [id number] at 4367w0d  admitted for induction of labor due to Post dates..  Subjective: Feels as if contractions are increasing in both intensity and frequency.  Does not want a pitocin break  Objective: Filed Vitals:   11/02/14 1755 11/02/14 1855 11/02/14 1928 11/02/14 2007  BP: 104/53 100/58  109/59  Pulse: 62 68  57  Temp:   98 F (36.7 C)   TempSrc:   Oral   Resp: 20 20 18 18   Height:      Weight:          FHT:  FHR: 140 bpm, variability: moderate,  accelerations:  Present,  decelerations:  Absent UC:   irregular, every 2-4 2minutes SVE:   Dilation: 3 Effacement (%): 70 Station: -3 [facility]:: [facility] Pitocin @ 20 mu/min  Labs: Lab Results  Component Value Date   WBC 7.2 11/02/2014   HGB 10.6* 11/02/2014   HCT 31.9* 11/02/2014   MCV 87.2 11/02/2014   PLT 223 11/02/2014    Assessment / Plan: IOL for postdates, hopefully nearing active labor  Labor: early Fetal Wellbeing:  Category I Pain Control:  Fentanyl Anticipated MOD:  NSVD  CRESENZO-DISHMAN,Masaji Billups 11/02/2014, 8:29 PM

## 2014-11-02 NOTE — Progress Notes (Signed)
Shelly Silva is a 34 y.o. [id number] at 6043w0d by 6 week U/S admitted for induction of labor due to postdates pregnancy at 7043w0d.  Subjective: Pt comfortable, reports occasional mild contractions.  S/O in room at this time.  Objective: BP 104/63 mmHg  Pulse 72  Temp(Src) 98.3 F (36.8 C) (Oral)  Resp 20  Ht 5\' 2"  (1.575 m)  Wt 75.751 kg (167 lb)  BMI 30.54 kg/m2  LMP 01/19/2014      FHT:  FHR: 130 bpm, variability: moderate,  accelerations:  Present,  decelerations:  Absent UC:   irregular, every 15-20 minutes SVE:   Dilation: Closed Effacement (%): Thick Exam by:: Sharen CounterLisa Leftwich-Kirby, CNM Pt breech confirmed by bedside sono by Dr Suezanne JacquetHenson and CNM  Labs: Lab Results  Component Value Date   WBC 7.2 11/02/2014   HGB 10.6* 11/02/2014   HCT 31.9* 11/02/2014   MCV 87.2 11/02/2014   PLT 223 11/02/2014    Assessment / Plan: Induction of labor due to postdates Breech presentation on U/S  Labor: Dr Erin FullingHarraway-Smith called to bedside to discuss options with pt including external version vs primary C/S.   Preeclampsia:  n/a Fetal Wellbeing:  Category I Pain Control:  Labor support without medications I/D:  n/a Anticipated MOD:  Pt and MD discussing delivery options at this time  LEFTWICH-KIRBY, Shelly Silva 11/02/2014, 10:06 AM

## 2014-11-03 ENCOUNTER — Encounter (HOSPITAL_COMMUNITY)
Admission: RE | Disposition: A | Discharge status: Home / Self Care | Payer: Self-pay | Source: Ambulatory Visit | Attending: Obstetrics & Gynecology

## 2014-11-03 ENCOUNTER — Inpatient Hospital Stay (HOSPITAL_COMMUNITY): Payer: Medicaid Other | Admitting: Anesthesiology

## 2014-11-03 ENCOUNTER — Encounter (HOSPITAL_COMMUNITY): Payer: Self-pay

## 2014-11-03 DIAGNOSIS — Z3A41 41 weeks gestation of pregnancy: Secondary | ICD-10-CM

## 2014-11-03 DIAGNOSIS — O99824 Streptococcus B carrier state complicating childbirth: Secondary | ICD-10-CM

## 2014-11-03 DIAGNOSIS — Z302 Encounter for sterilization: Secondary | ICD-10-CM | POA: Diagnosis not present

## 2014-11-03 DIAGNOSIS — A6000 Herpesviral infection of urogenital system, unspecified: Secondary | ICD-10-CM

## 2014-11-03 DIAGNOSIS — O9832 Other infections with a predominantly sexual mode of transmission complicating childbirth: Secondary | ICD-10-CM

## 2014-11-03 HISTORY — PX: TUBAL LIGATION: SHX77

## 2014-11-03 LAB — TYPE AND SCREEN
ABO/RH(D): A POS
Antibody Screen: NEGATIVE

## 2014-11-03 LAB — MRSA PCR SCREENING: MRSA by PCR: NEGATIVE

## 2014-11-03 LAB — RPR: RPR: NONREACTIVE

## 2014-11-03 SURGERY — LIGATION, FALLOPIAN TUBE, POSTPARTUM
Anesthesia: Epidural

## 2014-11-03 MED ORDER — METOCLOPRAMIDE HCL 5 MG/ML IJ SOLN: 10.0000 mg | Freq: Once | INTRAMUSCULAR | Status: AC | PRN

## 2014-11-03 MED ORDER — FERROUS SULFATE 325 (65 FE) MG PO TABS
325.0000 mg | ORAL_TABLET | Freq: Two times a day (BID) | ORAL | Status: DC
  Administered 2014-11-03 – 2014-11-04 (×2): 325 mg via ORAL
  Filled 2014-11-03 (×2): qty 1

## 2014-11-03 MED ORDER — DIPHENHYDRAMINE HCL 25 MG PO CAPS: 25.0000 mg | ORAL_CAPSULE | Freq: Four times a day (QID) | ORAL | Status: DC | PRN

## 2014-11-03 MED ORDER — FENTANYL CITRATE 0.05 MG/ML IJ SOLN
INTRAMUSCULAR | Status: DC | PRN
  Administered 2014-11-03: 25 ug via INTRAVENOUS
  Administered 2014-11-03: 50 ug via INTRAVENOUS
  Administered 2014-11-03: 25 ug via INTRAVENOUS
  Administered 2014-11-03: 50 ug via INTRAVENOUS

## 2014-11-03 MED ORDER — METHYLERGONOVINE MALEATE 0.2 MG/ML IJ SOLN: 0.2000 mg | INTRAMUSCULAR | Status: DC | PRN

## 2014-11-03 MED ORDER — DIBUCAINE 1 % RE OINT: 1.0000 "application " | TOPICAL_OINTMENT | RECTAL | Status: DC | PRN

## 2014-11-03 MED ORDER — PROPOFOL INFUSION 10 MG/ML OPTIME
INTRAVENOUS | Status: DC | PRN
  Administered 2014-11-03: 2 mL via INTRAVENOUS

## 2014-11-03 MED ORDER — FENTANYL CITRATE 0.05 MG/ML IJ SOLN
INTRAMUSCULAR | Status: AC
  Filled 2014-11-03: qty 5

## 2014-11-03 MED ORDER — LIDOCAINE HCL (PF) 1 % IJ SOLN
INTRAMUSCULAR | Status: DC | PRN
  Administered 2014-11-02: 4 mL
  Administered 2014-11-03: 6 mL

## 2014-11-03 MED ORDER — TETANUS-DIPHTH-ACELL PERTUSSIS 5-2.5-18.5 LF-MCG/0.5 IM SUSP: 0.5000 mL | Freq: Once | INTRAMUSCULAR | Status: DC

## 2014-11-03 MED ORDER — SIMETHICONE 80 MG PO CHEW: 80.0000 mg | CHEWABLE_TABLET | ORAL | Status: DC | PRN

## 2014-11-03 MED ORDER — FENTANYL CITRATE 0.05 MG/ML IJ SOLN
INTRAMUSCULAR | Status: AC
  Filled 2014-11-03: qty 2

## 2014-11-03 MED ORDER — FENTANYL CITRATE 0.05 MG/ML IJ SOLN: 25.0000 ug | INTRAMUSCULAR | Status: DC | PRN

## 2014-11-03 MED ORDER — ONDANSETRON HCL 4 MG PO TABS: 4.0000 mg | ORAL_TABLET | ORAL | Status: DC | PRN

## 2014-11-03 MED ORDER — MEASLES, MUMPS & RUBELLA VAC ~~LOC~~ INJ
0.5000 mL | INJECTION | Freq: Once | SUBCUTANEOUS | Status: DC
  Filled 2014-11-03: qty 0.5

## 2014-11-03 MED ORDER — MIDAZOLAM HCL 5 MG/5ML IJ SOLN
INTRAMUSCULAR | Status: DC | PRN
  Administered 2014-11-03 (×2): 1 mg via INTRAVENOUS

## 2014-11-03 MED ORDER — SENNOSIDES-DOCUSATE SODIUM 8.6-50 MG PO TABS
2.0000 | ORAL_TABLET | ORAL | Status: DC
Start: 2014-11-04 — End: 2014-11-04
  Administered 2014-11-03: 2 via ORAL
  Filled 2014-11-03: qty 2

## 2014-11-03 MED ORDER — BISACODYL 10 MG RE SUPP: 10.0000 mg | Freq: Every day | RECTAL | Status: DC | PRN

## 2014-11-03 MED ORDER — CHLOROPROCAINE HCL 3 % IJ SOLN
INTRAMUSCULAR | Status: DC | PRN
  Administered 2014-11-03: 10 mL

## 2014-11-03 MED ORDER — LACTATED RINGERS IV SOLN
INTRAVENOUS | Status: DC | PRN
  Administered 2014-11-03: 13:00:00 via INTRAVENOUS

## 2014-11-03 MED ORDER — ZOLPIDEM TARTRATE 5 MG PO TABS: 5.0000 mg | ORAL_TABLET | Freq: Every evening | ORAL | Status: DC | PRN

## 2014-11-03 MED ORDER — LANOLIN HYDROUS EX OINT: TOPICAL_OINTMENT | CUTANEOUS | Status: DC | PRN

## 2014-11-03 MED ORDER — ONDANSETRON HCL 4 MG/2ML IJ SOLN: 4.0000 mg | INTRAMUSCULAR | Status: DC | PRN

## 2014-11-03 MED ORDER — BENZOCAINE-MENTHOL 20-0.5 % EX AERO: 1.0000 "application " | INHALATION_SPRAY | CUTANEOUS | Status: DC | PRN

## 2014-11-03 MED ORDER — PROPOFOL 10 MG/ML IV BOLUS
INTRAVENOUS | Status: AC
  Filled 2014-11-03: qty 20

## 2014-11-03 MED ORDER — MEPERIDINE HCL 25 MG/ML IJ SOLN: 6.2500 mg | INTRAMUSCULAR | Status: DC | PRN

## 2014-11-03 MED ORDER — CHLOROPROCAINE HCL 3 % IJ SOLN
INTRAMUSCULAR | Status: AC
  Filled 2014-11-03: qty 20

## 2014-11-03 MED ORDER — OXYCODONE-ACETAMINOPHEN 5-325 MG PO TABS
2.0000 | ORAL_TABLET | ORAL | Status: DC | PRN
  Administered 2014-11-03 – 2014-11-04 (×3): 2 via ORAL
  Filled 2014-11-03 (×2): qty 2

## 2014-11-03 MED ORDER — WITCH HAZEL-GLYCERIN EX PADS: 1.0000 "application " | MEDICATED_PAD | CUTANEOUS | Status: DC | PRN

## 2014-11-03 MED ORDER — METHYLERGONOVINE MALEATE 0.2 MG PO TABS: 0.2000 mg | ORAL_TABLET | ORAL | Status: DC | PRN

## 2014-11-03 MED ORDER — FAMOTIDINE 20 MG PO TABS
40.0000 mg | ORAL_TABLET | Freq: Once | ORAL | Status: AC
  Administered 2014-11-03: 40 mg via ORAL
  Filled 2014-11-03: qty 2

## 2014-11-03 MED ORDER — OXYCODONE-ACETAMINOPHEN 5-325 MG PO TABS
1.0000 | ORAL_TABLET | ORAL | Status: DC | PRN
  Administered 2014-11-03: 1 via ORAL
  Filled 2014-11-03 (×3): qty 1

## 2014-11-03 MED ORDER — BUPIVACAINE HCL (PF) 0.25 % IJ SOLN
INTRAMUSCULAR | Status: AC
  Filled 2014-11-03: qty 30

## 2014-11-03 MED ORDER — SODIUM BICARBONATE 8.4 % IV SOLN
INTRAVENOUS | Status: AC
  Filled 2014-11-03: qty 50

## 2014-11-03 MED ORDER — OXYTOCIN 40 UNITS IN LACTATED RINGERS INFUSION - SIMPLE MED: 62.5000 mL/h | INTRAVENOUS | Status: DC | PRN

## 2014-11-03 MED ORDER — METOCLOPRAMIDE HCL 10 MG PO TABS
10.0000 mg | ORAL_TABLET | Freq: Once | ORAL | Status: AC
  Administered 2014-11-03: 10 mg via ORAL
  Filled 2014-11-03: qty 1

## 2014-11-03 MED ORDER — LACTATED RINGERS IV SOLN
INTRAVENOUS | Status: DC
  Administered 2014-11-03: 20 mL/h via INTRAVENOUS

## 2014-11-03 MED ORDER — IBUPROFEN 600 MG PO TABS
600.0000 mg | ORAL_TABLET | Freq: Four times a day (QID) | ORAL | Status: DC
  Administered 2014-11-03 – 2014-11-04 (×5): 600 mg via ORAL
  Filled 2014-11-03 (×5): qty 1

## 2014-11-03 MED ORDER — BUPIVACAINE HCL (PF) 0.25 % IJ SOLN
INTRAMUSCULAR | Status: DC | PRN
  Administered 2014-11-03: 30 mL

## 2014-11-03 MED ORDER — LIDOCAINE-EPINEPHRINE (PF) 2 %-1:200000 IJ SOLN
INTRAMUSCULAR | Status: AC
  Filled 2014-11-03: qty 20

## 2014-11-03 MED ORDER — SODIUM BICARBONATE 8.4 % IV SOLN
INTRAVENOUS | Status: DC | PRN
  Administered 2014-11-03 (×2): 5 mL via EPIDURAL
  Administered 2014-11-03: 7 mL via EPIDURAL
  Administered 2014-11-03: 5 mL via EPIDURAL
  Administered 2014-11-03: 3 mL via EPIDURAL

## 2014-11-03 MED ORDER — MIDAZOLAM HCL 2 MG/2ML IJ SOLN
INTRAMUSCULAR | Status: AC
  Filled 2014-11-03: qty 2

## 2014-11-03 MED ORDER — FLEET ENEMA 7-19 GM/118ML RE ENEM: 1.0000 | ENEMA | Freq: Every day | RECTAL | Status: DC | PRN

## 2014-11-03 MED ORDER — PRENATAL MULTIVITAMIN CH
1.0000 | ORAL_TABLET | Freq: Every day | ORAL | Status: DC
  Administered 2014-11-04: 1 via ORAL
  Filled 2014-11-03: qty 1

## 2014-11-03 SURGICAL SUPPLY — 22 items
BLADE SURG 11 STRL SS (BLADE) ×3 IMPLANT
CHLORAPREP W/TINT 26ML (MISCELLANEOUS) ×3 IMPLANT
CLIP FILSHIE TUBAL LIGA STRL (Clip) ×3 IMPLANT
CLOTH BEACON ORANGE TIMEOUT ST (SAFETY) ×3 IMPLANT
DRSG OPSITE POSTOP 3X4 (GAUZE/BANDAGES/DRESSINGS) ×3 IMPLANT
GLOVE BIO SURGEON STRL SZ 6.5 (GLOVE) ×2 IMPLANT
GLOVE BIO SURGEONS STRL SZ 6.5 (GLOVE) ×1
GLOVE BIOGEL PI IND STRL 7.0 (GLOVE) ×1 IMPLANT
GLOVE BIOGEL PI INDICATOR 7.0 (GLOVE) ×2
GOWN STRL REUS W/TWL LRG LVL3 (GOWN DISPOSABLE) ×6 IMPLANT
NDL HYPO 25X1 1.5 SAFETY (NEEDLE) ×1 IMPLANT
NEEDLE HYPO 25X1 1.5 SAFETY (NEEDLE) ×3 IMPLANT
NS IRRIG 1000ML POUR BTL (IV SOLUTION) ×3 IMPLANT
PACK ABDOMINAL MINOR (CUSTOM PROCEDURE TRAY) ×3 IMPLANT
SPONGE LAP 4X18 X RAY DECT (DISPOSABLE) ×2 IMPLANT
SUT VIC AB 0 CT1 27 (SUTURE) ×3
SUT VIC AB 0 CT1 27XBRD ANBCTR (SUTURE) ×1 IMPLANT
SUT VICRYL 4-0 PS2 18IN ABS (SUTURE) ×3 IMPLANT
SYR CONTROL 10ML LL (SYRINGE) ×3 IMPLANT
TOWEL OR 17X24 6PK STRL BLUE (TOWEL DISPOSABLE) ×6 IMPLANT
TRAY FOLEY BAG SILVER LF 16FR (CATHETERS) ×3 IMPLANT
WATER STERILE IRR 1000ML POUR (IV SOLUTION) ×3 IMPLANT

## 2014-11-03 NOTE — Anesthesia Procedure Notes (Signed)
Epidural Patient location during procedure: OB  Preanesthetic Checklist Completed: patient identified, site marked, surgical consent, pre-op evaluation, timeout performed, IV checked, risks and benefits discussed and monitors and equipment checked  Epidural Patient position: sitting Prep: site prepped and draped and DuraPrep Patient monitoring: continuous pulse ox and blood pressure Approach: midline Injection technique: LOR air  Needle:  Needle type: Tuohy  Needle gauge: 17 G Needle length: 9 cm and 9 Needle insertion depth: 4 cm Catheter type: closed end flexible Catheter size: 19 Gauge Catheter at skin depth: 10 cm Test dose: negative  Assessment Events: blood not aspirated, injection not painful, no injection resistance, negative IV test and no paresthesia  Additional Notes Dosing of Epidural:  1st dose, through catheter .............................................  Xylocaine 40 mg  2nd dose, through catheter, after waiting 3 minutes.........Xylocaine 60 mg    ( 1% Xylo charted as a single dose in Epic Meds for ease of charting; actual dosing was fractionated as above, for saftey's sake)  As each dose occurred, patient was free of IV sx; and patient exhibited no evidence of SA injection.  Patient is more comfortable after epidural dosed. Please see RN's note for documentation of vital signs,and FHR which are stable.  Patient reminded not to try to ambulate with numb legs, and that an RN must be present when she attempts to get up.       

## 2014-11-03 NOTE — Progress Notes (Signed)
Patient requests permanent sterilization, scheduled for PP BTL.  The procedure and the risk of anesthesia, bleeding, infection, bowel and bladder injury, failure (1/200) and ectopic pregnancy were discussed and her questions were answered. The procedure will be scheduled today. Epidural in place  Adam PhenixJames G Cristian Davitt, MD 11/03/2014 1:22 PM

## 2014-11-03 NOTE — Transfer of Care (Signed)
Immediate Anesthesia Transfer of Care Note  Patient: Shelly Silva  Procedure(s) Performed: Procedure(s): POST PARTUM TUBAL LIGATION (N/A)  Patient Location: PACU  Anesthesia Type:Epidural  Level of Consciousness: awake, alert  and oriented  Airway & Oxygen Therapy: Patient Spontanous Breathing  Post-op Assessment: Report given to RN and Post -op Vital signs reviewed and stable  Post vital signs: Reviewed and stable  Last Vitals:  Filed Vitals:   11/03/14 1240  BP: 116/64  Pulse: 57  Temp: 36.9 C  Resp: 18    Complications: No apparent anesthesia complications

## 2014-11-03 NOTE — Anesthesia Postprocedure Evaluation (Signed)
  Anesthesia Post-op Note  Patient: Shelly Silva  Procedure(s) Performed: Procedure(s): POST PARTUM TUBAL LIGATION (N/A)  Patient Location: PACU  Anesthesia Type:Epidural  Level of Consciousness: awake, alert  and oriented  Airway and Oxygen Therapy: Patient Spontanous Breathing  Post-op Pain: none  Post-op Assessment: Post-op Vital signs reviewed, Patient's Cardiovascular Status Stable, Respiratory Function Stable, Patent Airway, No signs of Nausea or vomiting, Pain level controlled, No headache, No backache, No residual numbness and No residual motor weakness  Post-op Vital Signs: Reviewed and stable  Last Vitals:  Filed Vitals:   11/03/14 1530  BP: 113/62  Pulse: 74  Temp: 36.6 C  Resp: 16    Complications: No apparent anesthesia complications

## 2014-11-03 NOTE — Plan of Care (Signed)
Problem: Phase II Progression Outcomes Goal: Initiate breastfeeding within 1hr delivery Outcome: Not Applicable Date Met:  07/03/10 Patient wants to bottle feed only.  Problem: Discharge Progression Outcomes Goal: Prior to transfer discontinue epidural Outcome: Not Applicable Date Met:  17/35/67 Patient going for bilateral tubal ligation

## 2014-11-03 NOTE — Op Note (Signed)
Shelly ApoMarkesha C Silva 11/02/2014 - 11/03/2014  PREOPERATIVE DIAGNOSIS:  Multiparity, undesired fertility  POSTOPERATIVE DIAGNOSIS:  Multiparity, undesired fertility  PROCEDURE:  Postpartum Bilateral Tubal Sterilization using Filshie Clips   ANESTHESIA:  Epidural, local anesthesia  COMPLICATIONS:  None immediate.  ESTIMATED BLOOD LOSS:  Less than 20 ml.  FLUIDS: 1000 ml LR.  URINE OUTPUT:  150 ml of clear urine.  INDICATIONS: 34 y.o. [id number]  with undesired fertility,status post vaginal delivery, desires permanent sterilization. Risks and benefits of procedure discussed with patient including permanence of method, bleeding, infection, injury to surrounding organs and need for additional procedures. Risk failure of 0.5-1% with increased risk of ectopic gestation if pregnancy occurs was also discussed with patient.   FINDINGS:  Normal uterus, tubes, and ovaries.  TECHNIQUE:  The patient was taken to the operating room where her epidural anesthesia was dosed up to surgical level and found to be adequate initially.  Patient reported pain when tested with Alice and subsequently given 30cc 0.5% Marcaine, given medications IV as well.  She was then placed in the dorsal supine position and prepped and draped in sterile fashion.  After an adequate timeout was performed, attention was turned to the patient's abdomen where a small transverse skin incision was made under the umbilical fold. The incision was taken down to the layer of fascia using the scalpel, and fascia was incised, and extended bilaterally using Mayo scissors. The peritoneum was entered in a sharp fashion. Attention was then turned to the patient's uterus, and left fallopian tube was identified and followed out to the fimbriated end.  A Filshie clip was placed on the left fallopian tube about 2 cm from the cornual attachment, with care given to incorporate the underlying mesosalpinx.  A similar process was carried out on the rightl side allowing  for bilateral tubal sterilization.  Good hemostasis was noted overall.  Local analgesia was drizzled on both operative sites.The instruments were then removed from the patient's abdomen and the fascial incision was repaired with 0 Vicryl, and the skin was closed with a 3-0 Monocryl subcuticular stitch. The patient tolerated the procedure well.  Sponge, lap, and needle counts were correct times two.  The patient was then taken to the recovery room awake, extubated and in stable condition.

## 2014-11-03 NOTE — Progress Notes (Signed)
This note also relates to the following rows which could not be included: BP - Cannot attach notes to unvalidated device data Pulse Rate - Cannot attach notes to unvalidated device data SpO2 - Cannot attach notes to unvalidated device data   Reported fetal heart rate with repetitive variables despite position change. Reported SVE. UCs every 1.5-2.5 min. Pitocin on 26 millunits now. Ordered to half pitocin.

## 2014-11-03 NOTE — Anesthesia Postprocedure Evaluation (Signed)
Anesthesia Post Note  Patient: Shelly Silva  Procedure(s) Performed: Procedure(s) (LRB): POST PARTUM TUBAL LIGATION (N/A)  Anesthesia type: Epidural  Patient location: Mother/Baby  Post pain: Pain level controlled  Post assessment: Post-op Vital signs reviewed  Last Vitals:  Filed Vitals:   11/03/14 1653  BP: 111/66  Pulse: 63  Temp: 37.1 C  Resp: 16    Post vital signs: Reviewed  Level of consciousness:alert  Complications: No apparent anesthesia complications

## 2014-11-03 NOTE — Addendum Note (Signed)
Addendum  created 11/03/14 2031 by Graciela HusbandsWynn O Jaylin Benzel, CRNA   Modules edited: Notes Section   Notes Section:  File: [id number]

## 2014-11-03 NOTE — Anesthesia Postprocedure Evaluation (Signed)
  Anesthesia Post-op Note  Patient: Shelly Silva  Procedure(s) Performed: * No procedures listed *  Patient Location: Mother/Baby  Anesthesia Type:Epidural  Level of Consciousness: awake, alert , oriented and patient cooperative  Airway and Oxygen Therapy: Patient Spontanous Breathing  Post-op Pain: none  Post-op Assessment: Post-op Vital signs reviewed, Patient's Cardiovascular Status Stable, Respiratory Function Stable, Patent Airway, No headache, No backache, No residual numbness and No residual motor weakness  Post-op Vital Signs: Reviewed and stable  Last Vitals:  Filed Vitals:   11/03/14 0300  BP: 113/55  Pulse: 62  Temp: 36.7 C  Resp: 20    Complications: No apparent anesthesia complications

## 2014-11-03 NOTE — Anesthesia Preprocedure Evaluation (Signed)
Anesthesia Evaluation  Patient identified by MRN, date of birth, ID band Patient awake    Reviewed: Allergy & Precautions, H&P , NPO status , Patient's Chart, lab work & pertinent test results  Airway Mallampati: II  TM Distance: >3 FB Neck ROM: full    Dental  (+) Teeth Intact   Pulmonary asthma , Current Smoker,  breath sounds clear to auscultation  Pulmonary exam normal       Cardiovascular negative cardio ROS  Rhythm:regular Rate:Normal     Neuro/Psych  Headaches, negative psych ROS   GI/Hepatic Neg liver ROS, GERD-  ,  Endo/Other  Obesity  Renal/GU negative Renal ROS  negative genitourinary   Musculoskeletal negative musculoskeletal ROS (+)   Abdominal (+) + obese,   Peds  Hematology  (+) anemia ,   Anesthesia Other Findings       Reproductive/Obstetrics Desires permanent contraception                             Anesthesia Physical  Anesthesia Plan  ASA: II  Anesthesia Plan: Epidural   Post-op Pain Management:    Induction:   Airway Management Planned: Natural Airway  Additional Equipment:   Intra-op Plan:   Post-operative Plan:   Informed Consent: I have reviewed the patients History and Physical, chart, labs and discussed the procedure including the risks, benefits and alternatives for the proposed anesthesia with the patient or authorized representative who has indicated his/her understanding and acceptance.   Dental Advisory Given  Plan Discussed with: Anesthesiologist, CRNA and Surgeon  Anesthesia Plan Comments: (Labs checked- platelets confirmed with RN in room. Fetal heart tracing, per RN, reported to be stable enough for sitting procedure. Discussed epidural, and patient consents to the procedure:  included risk of possible headache,backache, failed block, allergic reaction, and nerve injury. This patient was asked if she had any questions or concerns  before the procedure started.)        Anesthesia Quick Evaluation

## 2014-11-04 DIAGNOSIS — Z9851 Tubal ligation status: Secondary | ICD-10-CM

## 2014-11-04 HISTORY — DX: Tubal ligation status: Z98.51

## 2014-11-04 LAB — CBC
HCT: 22.6 % — ABNORMAL LOW (ref 36.0–46.0)
HEMOGLOBIN: 7.5 g/dL — AB (ref 12.0–15.0)
MCH: 28.7 pg (ref 26.0–34.0)
MCHC: 33.2 g/dL (ref 30.0–36.0)
MCV: 86.6 fL (ref 78.0–100.0)
Platelets: 151 10*3/uL (ref 150–400)
RBC: 2.61 MIL/uL — ABNORMAL LOW (ref 3.87–5.11)
RDW: 14.3 % (ref 11.5–15.5)
WBC: 6.5 10*3/uL (ref 4.0–10.5)

## 2014-11-04 MED ORDER — OXYCODONE-ACETAMINOPHEN 5-325 MG PO TABS: 10.0000 | ORAL_TABLET | ORAL | Status: DC | PRN

## 2014-11-04 NOTE — Discharge Summary (Signed)
Obstetric Discharge Summary Reason for Admission: induction of labor Prenatal Procedures: none Intrapartum Procedures: spontaneous vaginal delivery Postpartum Procedures: none Complications-Operative and Postpartum: none   Shelly ApoMarkesha C Silva is a 34 y.o. female [id number] at 6357w0d presenting for induction of labor for postdates.  History of HSV on valtrex, GBS+.11/03/14 at 1:10 AM a viable female was delivered via SVD. Anesthesia: epidural.  Episiotomy: none.  Lacerations: none. She is bottlefeeding.  PROCEDURE: Postpartum Bilateral Tubal Sterilization using Filshie Clips on 11/03/2014 TECHNIQUE: Small transverse abdominal skin incision was made under the umbilical fold. A Filshie clip was placed on the left fallopian tube about 2 cm from the cornual attachment, with care given to incorporate the underlying mesosalpinx. A similar process was carried out on the rightside allowing for bilateral tubal sterilization.   HEMOGLOBIN  Date Value Ref Range Status  11/04/2014 7.5* 12.0 - 15.0 g/dL Final    Comment:    REPEATED TO VERIFY DELTA CHECK NOTED    HCT  Date Value Ref Range Status  11/04/2014 22.6* 36.0 - 46.0 % Final    Physical Exam:  General: alert, cooperative, appears stated age and no distress Lochia: appropriate Uterine Fundus: firm Incision: healing well DVT Evaluation: No evidence of DVT seen on physical exam.  Discharge Diagnoses: Term Pregnancy-delivered; s/p bilateral tubal ligation  Discharge Information: Date: 11/04/2014 Activity: pelvic rest Diet: routine Medications: None Condition: stable Instructions: refer to practice specific booklet Discharge to: home Follow-up Information    [facility] Schedule an appointment as soon as possible for a visit in 4 weeks.   Contact information:   [address] [place] [id number] [phone]      Newborn Data: Live born female  Birth Weight: 6 lb 7 oz (2920  g) APGAR: 8, 9  Silva,Shelly 11/04/2014, 9:50 AM   I have seen and examined this patient and I agree with the above. Cam HaiSHAW, Ghazi Rumpf CNM 9:11 PM 11/08/2014

## 2014-11-06 ENCOUNTER — Encounter (HOSPITAL_COMMUNITY): Payer: Self-pay | Admitting: Obstetrics & Gynecology

## 2014-11-07 NOTE — Progress Notes (Signed)
Post discharge chart review completed.  

## 2014-11-21 ENCOUNTER — Encounter (HOSPITAL_COMMUNITY): Payer: Self-pay | Admitting: *Deleted

## 2014-11-21 ENCOUNTER — Emergency Department (HOSPITAL_COMMUNITY)
Admission: EM | Admit: 2014-11-21 | Discharge: 2014-11-21 | Disposition: A | Discharge status: Home / Self Care | Payer: Medicaid Other | Source: Home / Self Care | Attending: Family Medicine | Admitting: Family Medicine

## 2014-11-21 DIAGNOSIS — K602 Anal fissure, unspecified: Secondary | ICD-10-CM

## 2014-11-21 MED ORDER — DILTIAZEM GEL 2 %: 1.0000 "application " | Freq: Three times a day (TID) | CUTANEOUS | Status: DC

## 2014-11-21 NOTE — ED Provider Notes (Signed)
CSN: [id number]     Arrival date & time 11/21/14  0848 History   First MD Initiated Contact with Patient 11/21/14 [phone]     Chief Complaint  Patient presents with  . Rectal Bleeding   (Consider location/radiation/quality/duration/timing/severity/associated sxs/prior Treatment) Patient is a 34 y.o. female presenting with hematochezia. The history is provided by the patient.  Rectal Bleeding Quality:  Bright red Amount:  Moderate Duration:  1 week Progression:  Unchanged Chronicity:  New Context: diarrhea   Context comment:   3wks s/p vag delivery, without problem until developed diarrhea s/p del., pain with bm. Similar prior episodes: no   Ineffective treatments:  Hemorrhoid cream Associated symptoms: no vomiting     Past Medical History  Diagnosis Date  . History of miscarriage   . Asthma   . Headache(784.0)   . Vaginal Pap smear, abnormal     cervical cancer  . Infection     UTI  . HSV infection    Past Surgical History  Procedure Laterality Date  . Dilatation & curettage/hysteroscopy with trueclear  2003, 2006, 2006  . Gynecologic cryosurgery  2007  . Dilation and curettage of uterus    . Tubal ligation N/A 11/03/2014    Procedure: POST PARTUM TUBAL LIGATION;  Surgeon: Adam PhenixJames G Arnold, MD;  Location: [facility];  Service: Gynecology;  Laterality: N/A;   Family History  Problem Relation Age of Onset  . Diabetes Mother   . Hearing loss Neg Hx    History  Substance Use Topics  . Smoking status: Current Some Day Smoker -- 0.25 packs/day for 4 years    Types: Cigarettes  . Smokeless tobacco: Never Used     Comment: off and on  . Alcohol Use: No   OB History    Gravida Para Term Preterm AB TAB SAB Ectopic Multiple Living   10 6 5 1 4  4   0 6     Review of Systems  Constitutional: Negative.   Gastrointestinal: Positive for diarrhea, hematochezia and rectal pain. Negative for nausea, vomiting and constipation.    Allergies  Review of patient's allergies indicates no  known allergies.  Home Medications   Prior to Admission medications   Medication Sig Start Date End Date Taking? Authorizing Provider  acetaminophen (TYLENOL) 325 MG tablet Take 650 mg by mouth every 6 (six) hours as needed for mild pain.    Historical Provider, MD  albuterol (PROVENTIL HFA;VENTOLIN HFA) 108 (90 BASE) MCG/ACT inhaler Inhale 2 puffs into the lungs every 6 (six) hours as needed for wheezing.    Historical Provider, MD  cetirizine (ZYRTEC) 10 MG tablet Take 10 mg by mouth daily as needed for allergies.    Historical Provider, MD  oxyCODONE-acetaminophen (PERCOCET/ROXICET) 5-325 MG per tablet Take 10 tablets by mouth every 4 (four) hours as needed (pain). 11/04/14   Bluford MainAmber Henson, MD  valACYclovir (VALTREX) 500 MG tablet Take 1 tablet (500 mg total) by mouth 2 (two) times daily. 08/03/14   Jethro BastosUgonna A Anyanwu, MD   BP 120/74 mmHg  Pulse 72  Temp(Src) 98.6 F (37 C) (Oral)  Resp 18  SpO2 100% Physical Exam  Constitutional: She is oriented to person, place, and time. She appears well-developed and well-nourished.  Abdominal: Soft. Bowel sounds are normal.  Genitourinary:     Neurological: She is alert and oriented to person, place, and time.  Skin: Skin is warm and dry.  Nursing note and vitals reviewed.   ED Course  Procedures (including critical care time)  Labs Review Labs Reviewed - No data to display  Imaging Review No results found.   MDM  No diagnosis found.     Linna Hoff, MD 11/21/14 [phone]

## 2014-11-21 NOTE — ED Notes (Signed)
Pt  Reports  Some  Rectal  Bleeding / pain    Delivered  sev  Weeks  Ago  Had  Developed  hemmorhiods         Symptoms  Not  releived  By otc  meds

## 2014-11-21 NOTE — Discharge Instructions (Signed)
Avoid constipation, sit in warm tub before using cream, see your doctor if further problems.

## 2014-11-30 ENCOUNTER — Encounter: Payer: Self-pay | Admitting: *Deleted

## 2014-11-30 ENCOUNTER — Encounter: Payer: Self-pay | Admitting: Obstetrics & Gynecology

## 2014-12-07 ENCOUNTER — Encounter: Payer: Self-pay | Admitting: Physician Assistant

## 2014-12-07 ENCOUNTER — Ambulatory Visit (INDEPENDENT_AMBULATORY_CARE_PROVIDER_SITE_OTHER): Payer: Medicaid Other | Admitting: Physician Assistant

## 2014-12-07 NOTE — Progress Notes (Signed)
Patient ID: Katy ApoMarkesha C Soria, female   DOB: 10/02/1980, 34 y.o.   MRN: [id number] History:  Katy ApoMarkesha C Conkel is a 34 y.o. [id number] who presents to clinic today for postpartum exam.  She had vaginal delivery following successful version for breech presentation.  She had BTL prior to discharge.  She denies fever, weakness, SOB, CP, dysuria, vaginal bleeding.    The following portions of the patient's history were reviewed and updated as appropriate: allergies, current medications, past family history, past medical history, past social history, past surgical history and problem list.  Review of Systems:  Pertinent items are noted in HPI.  Objective:  Physical Exam BP 115/83 mmHg  Pulse 69  Temp(Src) 98.2 F (36.8 C) (Oral)  Wt 145 lb 3.2 oz (65.862 kg)  Breastfeeding? No GENERAL: Well-developed, well-nourished female in no acute distress.  HEENT: Normocephalic, atraumatic.  NECK: Supple. Normal thyroid.  LUNGS: Normal rate. Clear to auscultation bilaterally.  HEART: Regular rate and rhythm with no adventitious sounds.  ABDOMEN: Soft, tender in left upper quadrant, nondistended. No organomegaly. Normal bowel sounds appreciated in all quadrants.  EXTREMITIES: No cyanosis, clubbing, or edema, 2+ distal pulses.   Labs and Imaging No results found.  Assessment & Plan:  Assessment: 1. Postpartum care and examination   s/p BTL Abdominal pain - mild LUQ   Plans: Okay to return to work without restriction Return for annual exam and prn.    If abd pain continues - see PCP  Bertram DenverKaren E Teague Clark, PA-C 12/07/2014 3:54 PM

## 2014-12-07 NOTE — Patient Instructions (Signed)
Postpartum Tubal Ligation °Care After °Refer to this sheet in the next few weeks. These instructions provide you with information on caring for yourself after your procedure. Your caregiver may also give you more specific instructions. Your treatment has been planned according to current medical practices, but problems sometimes occur. Call your caregiver if you have any problems or questions after your procedure. °HOME CARE INSTRUCTIONS  °· Rest the remainder of the day. °· Only take over-the-counter or prescription medicines for pain, discomfort, or fever as directed by your caregiver. Do not take aspirin. It can cause bleeding. °· Gradually resume daily activities, diet, rest, driving, and work. °· Avoid sexual intercourse for 2 weeks or as directed. °· Do not drive while taking pain medicine. °· Do not lift anything over 5 pounds for 2 weeks or as directed. °· Only take showers, not baths, until you are seen by your caregiver. °· Change bandages (dressings) as directed. °· Take your temperature twice a day and record it. °· Try to have help for the first 7-10 days for your household needs. °· Return to your caregiver to get your stitches (sutures) removed and for follow-up visits as directed. °SEEK MEDICAL CARE IF:  °· You have redness, swelling, or increasing pain in the wound. °· You have drainage from the wound lasting longer than 1 day. °· Your pain is getting worse. °· You have a rash. °· You become dizzy or lightheaded. °· You have a reaction to your medicine. °· You need stronger medicine or a change in your pain medicine. °· You notice a bad smell coming from the wound or dressing. °· Your wound breaks open after the sutures have been removed. °· You are constipated. °SEEK IMMEDIATE MEDICAL CARE IF:  °· You faint. °· You have a fever. °· You have increasing abdominal pain. °· You have severe pain in your shoulders. °· You have bleeding or drainage from the suture sites or vagina following surgery. °· You  have shortness of breath or difficulty breathing. °· You have chest or leg pain. °· You have persistent nausea, vomiting, or diarrhea. °MAKE SURE YOU:  °· Understand these instructions. °· Watch your condition. °· Get help right away if you are not doing well or get worse. °Document Released: 02/17/2012 Document Reviewed: 02/17/2012 °[employer]® Patient Information ©2015 [employer]. This information is not intended to replace advice given to you by your health care provider. Make sure you discuss any questions you have with your health care provider. ° °

## 2014-12-24 IMAGING — US US OB FOLLOW-UP
1 series · 12 of 28 positions shown · non-contrast
Comparison: none

[Series 1: us ob follow up · 12 of 78 slices shown]
[im 3/78]
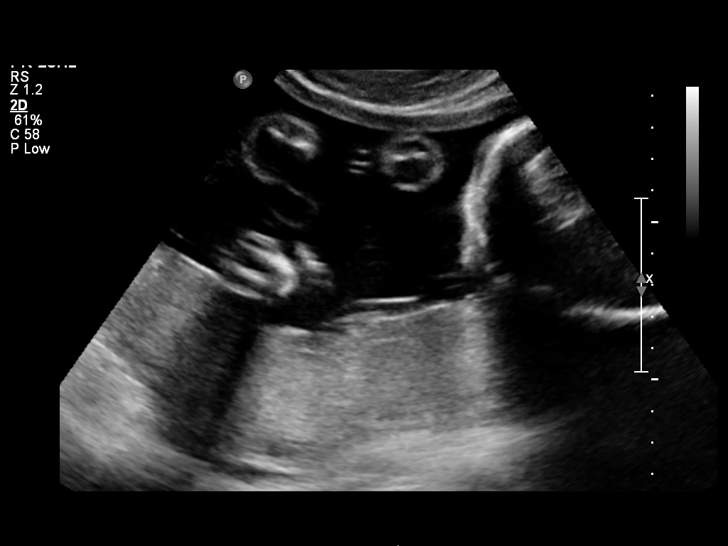
[im 9/78]
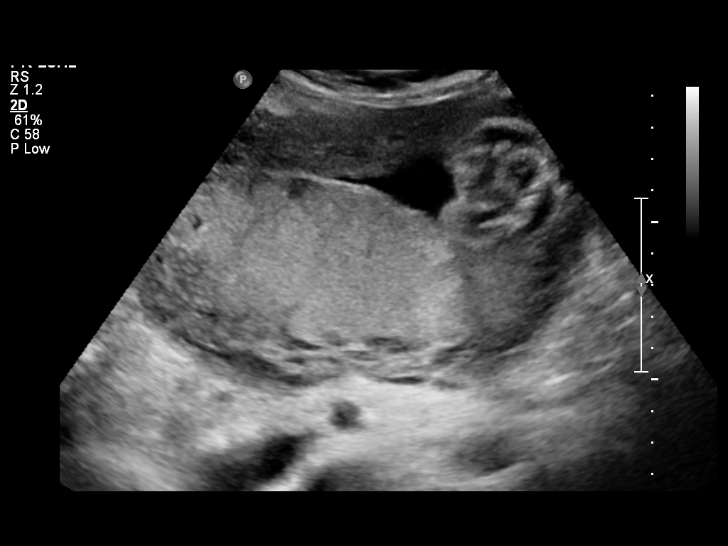
[im 15/78]
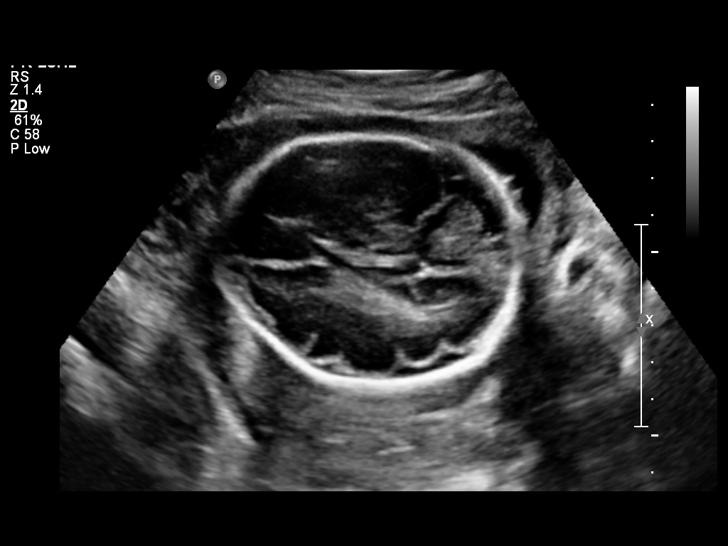
[im 23/78]
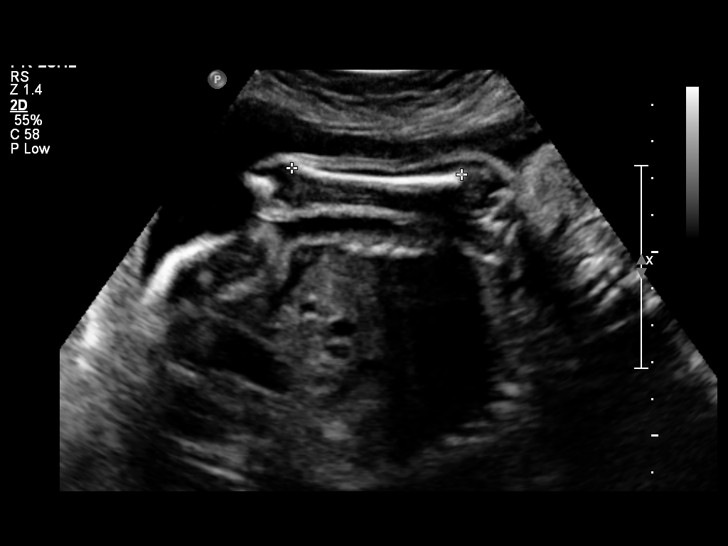
[im 29/78]
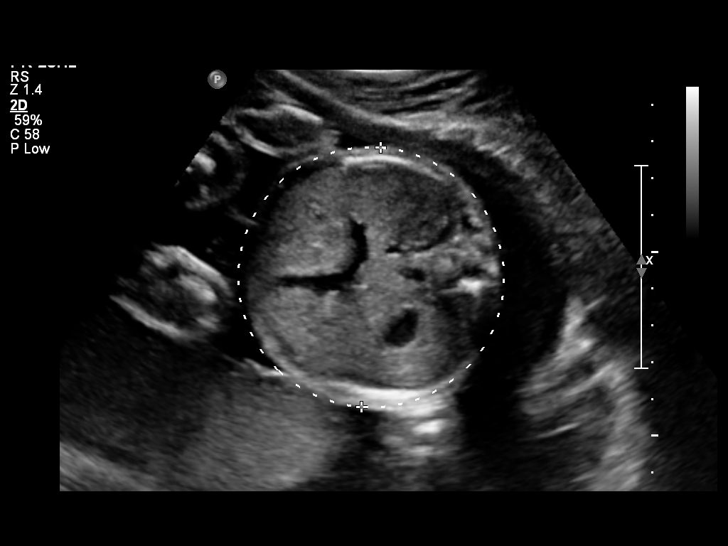
[im 35/78]
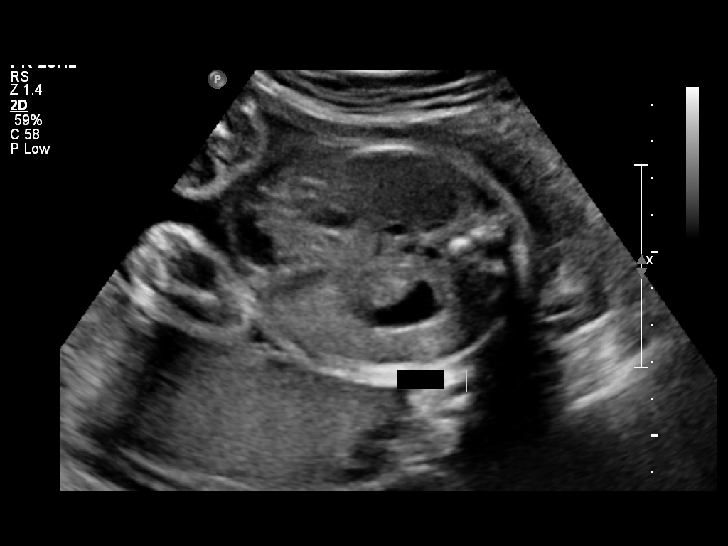
[im 43/78]
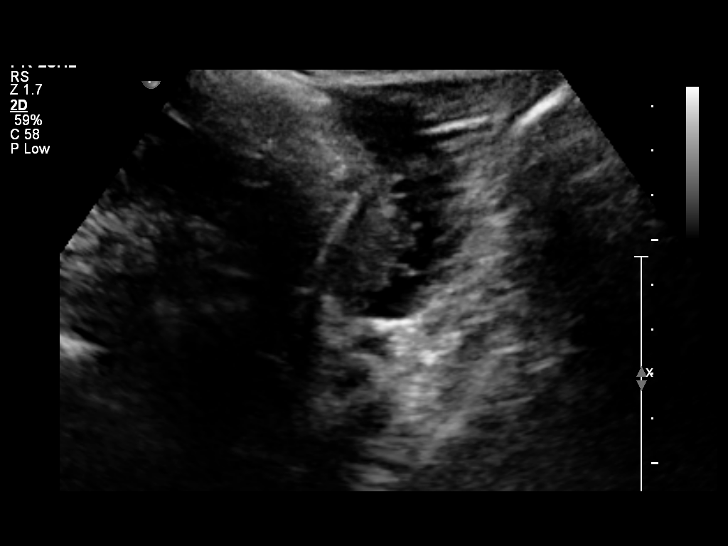
[im 49/78]
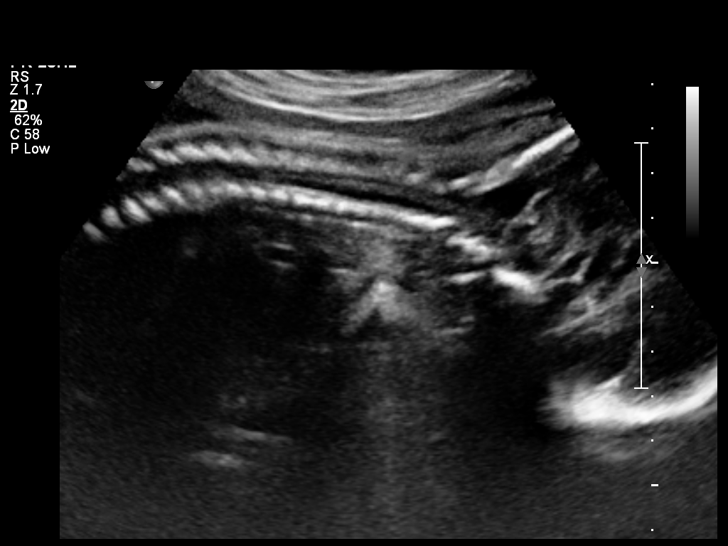
[im 55/78]
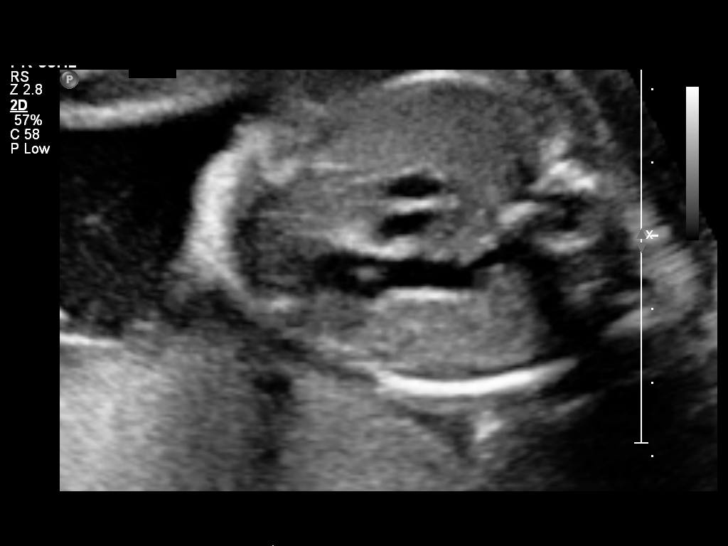
[im 63/78]
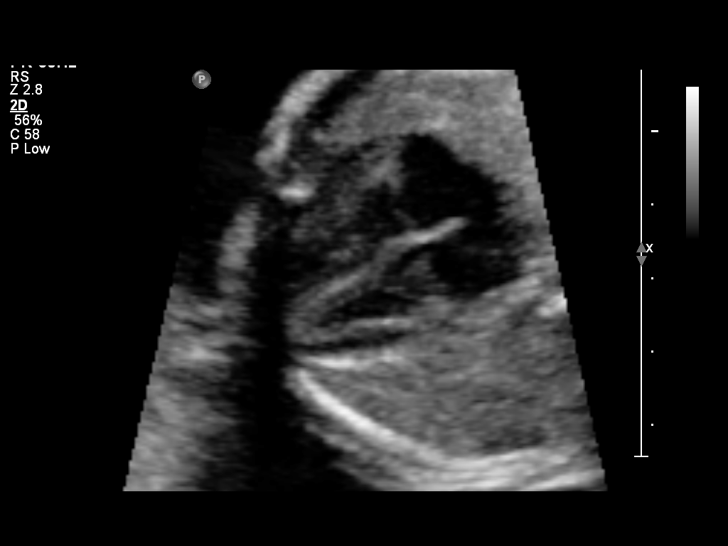
[im 69/78]
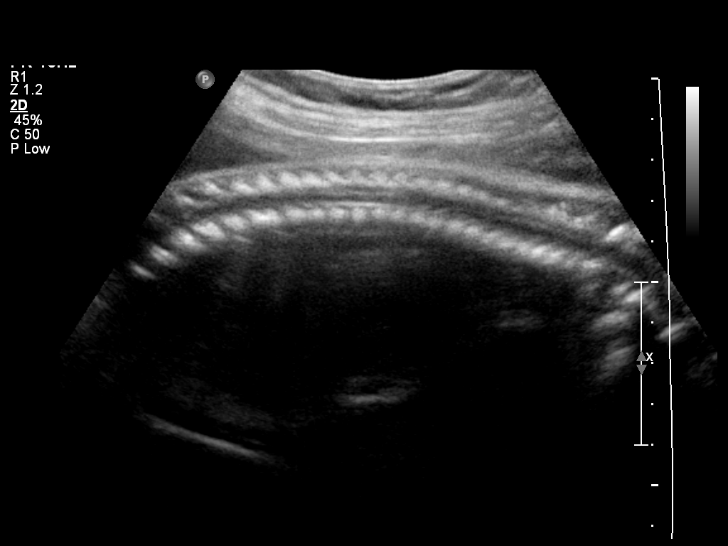
[im 75/78]
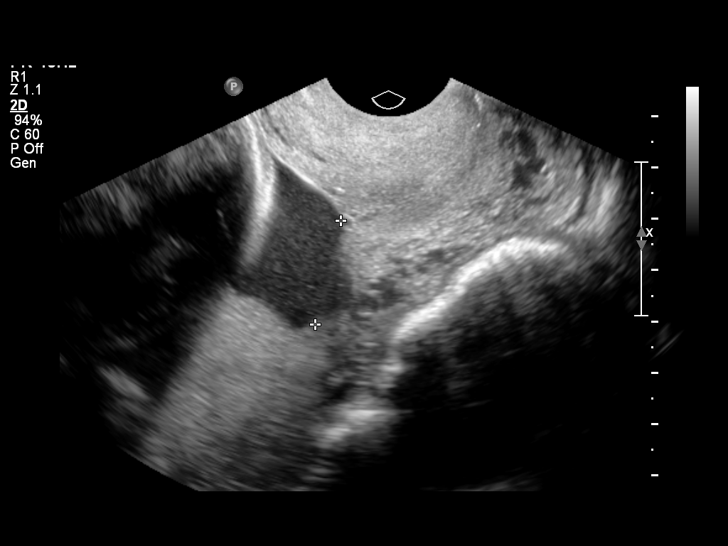

[12 of 28 positions shown; findings below may reference images not displayed]

OBSTETRICS REPORT
                      (Signed Final 07/31/2014 [DATE])

Service(s) Provided

 US OB FOLLOW UP                                       76816.1
 US MFM OB TRANSVAGINAL                                76817.2
Indications

 Poor obstetric history: Previous preterm delivery at
 32 weeks
 Herpes simplex virus (ALIYAAH)
 Placenta previa/Low lying: Bleeding
 27 weeks gestation of pregnancy
Fetal Evaluation

 Num Of Fetuses:    1
 Fetal Heart Rate:  134                          bpm
 Cardiac Activity:  Observed
 Presentation:      Cephalic
 Placenta:          Posterior, above cervical
                    os
 P. Cord            Visualized, central
 Insertion:

 Comment:    Inferior placental edge is 2.5cm from internal os.

 Amniotic Fluid
 AFI FV:      Subjectively within normal limits
                                             Larg Pckt:     6.4  cm
Biometry

 BPD:     66.7  mm     G. Age:  26w 6d                CI:        69.04   70 - 86
                                                      FL/HC:      20.0   18.8 -

 HC:     256.4  mm     G. Age:  27w 6d       30  %    HC/AC:      1.15   1.05 -

 AC:     222.3  mm     G. Age:  26w 4d       18  %    FL/BPD:     76.9   71 - 87
 FL:      51.3  mm     G. Age:  27w 3d       32  %    FL/AC:      23.1   20 - 24
 HUM:     45.9  mm     G. Age:  27w 0d       36  %
 CER:     30.2  mm     G. Age:  26w 4d       32  %

 Est. FW:    1931  gm      2 lb 4 oz     41  %
Gestational Age

 LMP:           27w 4d        Date:  01/19/14                 EDD:   10/26/14
 U/S Today:     27w 1d                                        EDD:   10/29/14
 Best:          27w 4d     Det. By:  LMP  (01/19/14)          EDD:   10/26/14
Anatomy

 Cranium:          Appears normal         Aortic Arch:      Previously seen
 Fetal Cavum:      Appears normal         Ductal Arch:      Previously seen
 Ventricles:       Appears normal         Diaphragm:        Appears normal
 Choroid Plexus:   Appears normal         Stomach:          Appears normal, left
                                                            sided
 Cerebellum:       Appears normal         Abdomen:          Appears normal
 Posterior Fossa:  Appears normal         Abdominal Wall:   Appears nml (cord
                                                            insert, abd wall)
 Nuchal Fold:      Previously seen        Cord Vessels:     Appears normal (3
                                                            vessel cord)
 Face:             Orbits and profile     Kidneys:          Appear normal
                   previously seen
 Lips:             Previously seen        Bladder:          Appears normal
 Heart:            Appears normal         Spine:            Previously seen
                   (4CH, axis, and
                   situs)
 RVOT:             Appears normal         Lower             Previously seen
                                          Extremities:
 LVOT:             Appears normal         Upper             Previously seen
                                          Extremities:

 Other:  Fetus appears to be a female. Heels prev. visualized
Cervix Uterus Adnexa

 Cervical Length:    3.6      cm

 Cervix:       Measured transvaginally.

 Left Ovary:    Within normal limits. Simple cyst measuring
                0.0x0.7x0.2 cm
 Right Ovary:   Within normal limits.
Impression

 SIUP at 27+4 weeks
 Normal interval anatomy; anatomic survey complete
 Normal amniotic fluid volume
 Appropriate interval growth with EFW at the 41st %tile
 Posterior placenta; no previa or SCH identified
Recommendations

 Follow-up as clinically indicated

## 2015-01-14 ENCOUNTER — Inpatient Hospital Stay (HOSPITAL_COMMUNITY)
Admission: AD | Admit: 2015-01-14 | Discharge: 2015-01-14 | Disposition: A | Discharge status: Home / Self Care | Payer: Medicaid Other | Source: Ambulatory Visit | Attending: Obstetrics and Gynecology | Admitting: Obstetrics and Gynecology

## 2015-01-14 ENCOUNTER — Encounter (HOSPITAL_COMMUNITY): Payer: Self-pay

## 2015-01-14 DIAGNOSIS — N939 Abnormal uterine and vaginal bleeding, unspecified: Secondary | ICD-10-CM | POA: Diagnosis not present

## 2015-01-14 DIAGNOSIS — R1030 Lower abdominal pain, unspecified: Secondary | ICD-10-CM | POA: Insufficient documentation

## 2015-01-14 DIAGNOSIS — F17210 Nicotine dependence, cigarettes, uncomplicated: Secondary | ICD-10-CM | POA: Diagnosis not present

## 2015-01-14 LAB — URINALYSIS, ROUTINE W REFLEX MICROSCOPIC
BILIRUBIN URINE: NEGATIVE
Glucose, UA: NEGATIVE mg/dL
Ketones, ur: NEGATIVE mg/dL
Leukocytes, UA: NEGATIVE
Nitrite: NEGATIVE
PROTEIN: NEGATIVE mg/dL
Specific Gravity, Urine: 1.02 (ref 1.005–1.030)
Urobilinogen, UA: 1 mg/dL (ref 0.0–1.0)
pH: 6.5 (ref 5.0–8.0)

## 2015-01-14 LAB — CBC WITH DIFFERENTIAL/PLATELET
Basophils Absolute: 0.1 10*3/uL (ref 0.0–0.1)
Basophils Relative: 1 % (ref 0–1)
EOS ABS: 0.4 10*3/uL (ref 0.0–0.7)
Eosinophils Relative: 7 % — ABNORMAL HIGH (ref 0–5)
HEMATOCRIT: 37.8 % (ref 36.0–46.0)
HEMOGLOBIN: 12.6 g/dL (ref 12.0–15.0)
LYMPHS ABS: 2.8 10*3/uL (ref 0.7–4.0)
Lymphocytes Relative: 45 % (ref 12–46)
MCH: 28.9 pg (ref 26.0–34.0)
MCHC: 33.3 g/dL (ref 30.0–36.0)
MCV: 86.7 fL (ref 78.0–100.0)
MONO ABS: 0.5 10*3/uL (ref 0.1–1.0)
Monocytes Relative: 8 % (ref 3–12)
Neutro Abs: 2.4 10*3/uL (ref 1.7–7.7)
Neutrophils Relative %: 39 % — ABNORMAL LOW (ref 43–77)
Platelets: 227 10*3/uL (ref 150–400)
RBC: 4.36 MIL/uL (ref 3.87–5.11)
RDW: 15 % (ref 11.5–15.5)
WBC: 6 10*3/uL (ref 4.0–10.5)

## 2015-01-14 LAB — URINE MICROSCOPIC-ADD ON

## 2015-01-14 LAB — POCT PREGNANCY, URINE: Preg Test, Ur: NEGATIVE

## 2015-01-14 MED ORDER — IBUPROFEN 600 MG PO TABS: 600.0000 mg | ORAL_TABLET | Freq: Four times a day (QID) | ORAL | Status: DC | PRN

## 2015-01-14 NOTE — Discharge Instructions (Signed)

## 2015-01-14 NOTE — MAU Provider Note (Signed)
History     CSN: [id number]  Arrival date and time: 01/14/15 [id number]   First Provider Initiated Contact with Patient 01/14/15 1924      Chief Complaint  Patient presents with  . Vaginal Bleeding  . Abdominal Pain   HPI  [name] is a 34 y.o. [id number] who presents to MAU today with complaint of lower abdominal pain and vaginal bleeding. The patient states that abdominal pain has been off and on since the birth of her last child 11/03/14. Patient also had PP BTL while still in the hospital. She has tried Tylenol for pain with minimal relief. The patient also complains that she has had vaginal bleeding off and on since 11/03/14 as well. She states light bleeding now, but sometimes heavier bleeding. Per PP visit in GYN clinic patient was not having bleeding at that time. Patient states that after sex, BM or while standing at work she notes more bleeding. She denies fever, weakness, dizziness or SOB.   OB History    Gravida Para Term Preterm AB TAB SAB Ectopic Multiple Living   10 6 5 1 4  4   0 6      Past Medical History  Diagnosis Date  . History of miscarriage   . Asthma   . Headache(784.0)   . Vaginal Pap smear, abnormal     cervical cancer  . Infection     UTI  . HSV infection     Past Surgical History  Procedure Laterality Date  . Dilatation & curettage/hysteroscopy with trueclear  2003, 2006, 2006  . Gynecologic cryosurgery  2007  . Dilation and curettage of uterus    . Tubal ligation N/A 11/03/2014    Procedure: POST PARTUM TUBAL LIGATION;  Surgeon: Adam PhenixJames G Arnold, MD;  Location: [facility];  Service: Gynecology;  Laterality: N/A;    Family History  Problem Relation Age of Onset  . Diabetes Mother   . Hearing loss Neg Hx     History  Substance Use Topics  . Smoking status: Current Some Day Smoker -- 0.25 packs/day for 4 years    Types: Cigarettes  . Smokeless tobacco: Never Used     Comment: off and on  . Alcohol Use: 0.6 oz/week    1 Shots of liquor per  week    Allergies: No Known Allergies  Prescriptions prior to admission  Medication Sig Dispense Refill Last Dose  . acetaminophen (TYLENOL) 325 MG tablet Take 650 mg by mouth every 6 (six) hours as needed for mild pain.   Taking  . albuterol (PROVENTIL HFA;VENTOLIN HFA) 108 (90 BASE) MCG/ACT inhaler Inhale 2 puffs into the lungs every 6 (six) hours as needed for wheezing.   Taking  . cetirizine (ZYRTEC) 10 MG tablet Take 10 mg by mouth daily as needed for allergies.   Not Taking  . diltiazem 2 % GEL Apply 1 application topically 3 (three) times daily. 30 g 1 Taking  . diphenhydrAMINE (BENADRYL) 50 MG capsule Take 50 mg by mouth every 6 (six) hours as needed.   Taking  . oxyCODONE-acetaminophen (PERCOCET/ROXICET) 5-325 MG per tablet Take 10 tablets by mouth every 4 (four) hours as needed (pain). (Patient not taking: Reported on 12/07/2014) 30 tablet 0 Not Taking  . valACYclovir (VALTREX) 500 MG tablet Take 1 tablet (500 mg total) by mouth 2 (two) times daily. 30 tablet 6 Taking    Review of Systems  Constitutional: Negative for fever and malaise/fatigue.  Gastrointestinal: Positive for abdominal pain. Negative  for nausea, vomiting, diarrhea and constipation.  Genitourinary: Negative for dysuria, urgency and frequency.       + vaginal bleeding  Neurological: Negative for dizziness, loss of consciousness and weakness.   Physical Exam   Blood pressure 130/82, pulse 59, temperature 98.1 F (36.7 C), temperature source Oral, resp. rate 18, height 5\' 2"  (1.575 m), weight 150 lb (68.04 kg), SpO2 100 %, not currently breastfeeding.  Physical Exam  Nursing note and vitals reviewed. Constitutional: She is oriented to person, place, and time. She appears well-developed and well-nourished. No distress.  HENT:  Head: Normocephalic and atraumatic.  Cardiovascular: Normal rate.   Respiratory: Effort normal.  GI: Soft. She exhibits no distension and no mass. There is no tenderness. There is no  rebound and no guarding.  Genitourinary: Uterus is not enlarged and not tender. Cervix exhibits no motion tenderness, no discharge and no friability. Right adnexum displays no mass and no tenderness. Left adnexum displays tenderness (mild). Left adnexum displays no mass. No bleeding in the vagina. Vaginal discharge (scant thin, white discharge noted) found.  Neurological: She is alert and oriented to person, place, and time.  Skin: Skin is warm and dry. No erythema.  Psychiatric: She has a normal mood and affect.   Results for orders placed or performed during the hospital encounter of 01/14/15 (from the past 24 hour(s))  Urinalysis, Routine w reflex microscopic     Status: Abnormal   Collection Time: 01/14/15  7:15 PM  Result Value Ref Range   Color, Urine YELLOW YELLOW   APPearance CLEAR CLEAR   Specific Gravity, Urine 1.020 1.005 - 1.030   pH 6.5 5.0 - 8.0   Glucose, UA NEGATIVE NEGATIVE mg/dL   Hgb urine dipstick TRACE (A) NEGATIVE   Bilirubin Urine NEGATIVE NEGATIVE   Ketones, ur NEGATIVE NEGATIVE mg/dL   Protein, ur NEGATIVE NEGATIVE mg/dL   Urobilinogen, UA 1.0 0.0 - 1.0 mg/dL   Nitrite NEGATIVE NEGATIVE   Leukocytes, UA NEGATIVE NEGATIVE  Urine microscopic-add on     Status: Abnormal   Collection Time: 01/14/15  7:15 PM  Result Value Ref Range   Squamous Epithelial / LPF RARE RARE   WBC, UA 0-2 <3 WBC/hpf   RBC / HPF 0-2 <3 RBC/hpf   Bacteria, UA FEW (A) RARE  Pregnancy, urine POC     Status: None   Collection Time: 01/14/15  7:22 PM  Result Value Ref Range   Preg Test, Ur NEGATIVE NEGATIVE  CBC with Differential/Platelet     Status: Abnormal   Collection Time: 01/14/15  7:25 PM  Result Value Ref Range   WBC 6.0 4.0 - 10.5 K/uL   RBC 4.36 3.87 - 5.11 MIL/uL   Hemoglobin 12.6 12.0 - 15.0 g/dL   HCT 40.937.8 81.136.0 - 91.446.0 %   MCV 86.7 78.0 - 100.0 fL   MCH 28.9 26.0 - 34.0 pg   MCHC 33.3 30.0 - 36.0 g/dL   RDW 78.215.0 95.611.5 - 21.315.5 %   Platelets 227 150 - 400 K/uL    Neutrophils Relative % 39 (L) 43 - 77 %   Neutro Abs 2.4 1.7 - 7.7 K/uL   Lymphocytes Relative 45 12 - 46 %   Lymphs Abs 2.8 0.7 - 4.0 K/uL   Monocytes Relative 8 3 - 12 %   Monocytes Absolute 0.5 0.1 - 1.0 K/uL   Eosinophils Relative 7 (H) 0 - 5 %   Eosinophils Absolute 0.4 0.0 - 0.7 K/uL   Basophils Relative 1 0 -  1 %   Basophils Absolute 0.1 0.0 - 0.1 K/uL    MAU Course  Procedures None  MDM UPT - negative UA today Reviewed records from previous hospital admission and PP visit Assessment and Plan  A: Abnormal Uterine Bleeding  P: Discharge home Rx for Ibuprofen given to patient Bleeding precautions discussed Outpatient [place] ordered for later this week. They will call patient with an appointment Patient advised to follow-up with [facility] for results and management Patient may return to MAU as needed or if her condition were to change or worsen   Marny Lowenstein, PA-C  01/14/2015, 7:50 PM

## 2015-01-14 NOTE — MAU Note (Signed)
Pt reports she is s/p BTL 03/04, states she has continued to have lower abd pain and vaginal bleeding. States the bleeding will stop for a few days and then start back. Today the bleeding is not has heavy but there is an odor with it.

## 2015-01-22 ENCOUNTER — Telehealth: Payer: Self-pay | Admitting: *Deleted

## 2015-01-22 NOTE — Telephone Encounter (Signed)
Call precert center [facility] at [phone] with precert for ultrasound she is having tomorrow.

## 2015-01-22 NOTE — Telephone Encounter (Signed)
preauthorization obtained and called to precert center.

## 2015-01-23 ENCOUNTER — Ambulatory Visit (HOSPITAL_COMMUNITY)
Admission: RE | Admit: 2015-01-23 | Discharge: 2015-01-23 | Disposition: A | Discharge status: Home / Self Care | Payer: Medicaid Other | Source: Ambulatory Visit | Attending: Medical | Admitting: Medical

## 2015-01-23 DIAGNOSIS — R102 Pelvic and perineal pain: Secondary | ICD-10-CM | POA: Insufficient documentation

## 2015-01-23 DIAGNOSIS — N939 Abnormal uterine and vaginal bleeding, unspecified: Secondary | ICD-10-CM | POA: Diagnosis present

## 2015-01-30 ENCOUNTER — Telehealth: Payer: Self-pay | Admitting: *Deleted

## 2015-01-30 NOTE — Telephone Encounter (Signed)
Reviewed ultrasound , chart and patient complaints with Dr. Debroah LoopArnold. Called [facility] and infomred her ultrasound was normal. Discussed her bleeding which continues to be less at time, heavier at times. Advised her bleeding should stop soon- if it doesn't call to make appointment, if heavy go to MAU. If feels lightheaded , dizzy call clinic for appointment or MAU. Shelly Silva voices understanding.

## 2015-01-30 NOTE — Telephone Encounter (Signed)
Shelly MannanMarkesha called wanting results of ultrasound done 01/23/15- said was told results readyin 3 days.

## 2015-05-20 ENCOUNTER — Emergency Department (HOSPITAL_COMMUNITY)
Admission: EM | Admit: 2015-05-20 | Discharge: 2015-05-20 | Disposition: A | Discharge status: Home / Self Care | Payer: Medicaid Other | Source: Home / Self Care

## 2015-05-20 ENCOUNTER — Encounter (HOSPITAL_COMMUNITY): Payer: Self-pay | Admitting: Emergency Medicine

## 2015-05-20 ENCOUNTER — Other Ambulatory Visit (HOSPITAL_COMMUNITY)
Admission: RE | Admit: 2015-05-20 | Discharge: 2015-05-20 | Disposition: A | Discharge status: Home / Self Care | Payer: Medicaid Other | Source: Ambulatory Visit | Attending: Family Medicine | Admitting: Family Medicine

## 2015-05-20 DIAGNOSIS — R350 Frequency of micturition: Secondary | ICD-10-CM

## 2015-05-20 DIAGNOSIS — Z113 Encounter for screening for infections with a predominantly sexual mode of transmission: Secondary | ICD-10-CM | POA: Insufficient documentation

## 2015-05-20 DIAGNOSIS — N898 Other specified noninflammatory disorders of vagina: Secondary | ICD-10-CM

## 2015-05-20 DIAGNOSIS — N760 Acute vaginitis: Secondary | ICD-10-CM | POA: Diagnosis present

## 2015-05-20 DIAGNOSIS — R1030 Lower abdominal pain, unspecified: Secondary | ICD-10-CM

## 2015-05-20 LAB — POCT URINALYSIS DIP (DEVICE)
Bilirubin Urine: NEGATIVE
Glucose, UA: NEGATIVE mg/dL
HGB URINE DIPSTICK: NEGATIVE
Ketones, ur: NEGATIVE mg/dL
LEUKOCYTES UA: NEGATIVE
NITRITE: NEGATIVE
PH: 6.5 (ref 5.0–8.0)
Protein, ur: 30 mg/dL — AB
Specific Gravity, Urine: 1.025 (ref 1.005–1.030)
Urobilinogen, UA: 4 mg/dL — ABNORMAL HIGH (ref 0.0–1.0)

## 2015-05-20 LAB — POCT PREGNANCY, URINE: Preg Test, Ur: NEGATIVE

## 2015-05-20 MED ORDER — CEFTRIAXONE SODIUM 250 MG IJ SOLR
250.0000 mg | Freq: Once | INTRAMUSCULAR | Status: AC
  Administered 2015-05-20: 250 mg via INTRAMUSCULAR

## 2015-05-20 MED ORDER — AZITHROMYCIN 250 MG PO TABS
ORAL_TABLET | ORAL | Status: AC
  Filled 2015-05-20: qty 4

## 2015-05-20 MED ORDER — CEFTRIAXONE SODIUM 250 MG IJ SOLR
INTRAMUSCULAR | Status: AC
  Filled 2015-05-20: qty 250

## 2015-05-20 MED ORDER — LIDOCAINE HCL (PF) 1 % IJ SOLN
INTRAMUSCULAR | Status: AC
  Filled 2015-05-20: qty 5

## 2015-05-20 MED ORDER — AZITHROMYCIN 250 MG PO TABS
1000.0000 mg | ORAL_TABLET | Freq: Once | ORAL | Status: AC
  Administered 2015-05-20: 1000 mg via ORAL

## 2015-05-20 NOTE — Discharge Instructions (Signed)
It was nice seeing you today. Your urine test is negative for infection. You however have protein and urobilirubin in your urine. Please discuss with your PCP soon to recheck and if positive again you will benefit from liver testing. I obtained your vaginal discharge specimen for testing. Today we gave antibiotic to cover for gonorrhea and chlamydia since you had it before. See [place] soon if no improvement. Use Tylenol as needed for pain.  Vaginitis Vaginitis is an inflammation of the vagina. It is most often caused by a change in the normal balance of the bacteria and yeast that live in the vagina. This change in balance causes an overgrowth of certain bacteria or yeast, which causes the inflammation. There are different types of vaginitis, but the most common types are:  Bacterial vaginosis.  Yeast infection (candidiasis).  Trichomoniasis vaginitis. This is a sexually transmitted infection (STI).  Viral vaginitis.  Atropic vaginitis.  Allergic vaginitis. CAUSES  The cause depends on the type of vaginitis. Vaginitis can be caused by:  Bacteria (bacterial vaginosis).  Yeast (yeast infection).  A parasite (trichomoniasis vaginitis)  A virus (viral vaginitis).  Low hormone levels (atrophic vaginitis). Low hormone levels can occur during pregnancy, breastfeeding, or after menopause.  Irritants, such as bubble baths, scented tampons, and feminine sprays (allergic vaginitis). Other factors can change the normal balance of the yeast and bacteria that live in the vagina. These include:  Antibiotic medicines.  Poor hygiene.  Diaphragms, vaginal sponges, spermicides, birth control pills, and intrauterine devices (IUD).  Sexual intercourse.  Infection.  Uncontrolled diabetes.  A weakened immune system. SYMPTOMS  Symptoms can vary depending on the cause of the vaginitis. Common symptoms include:  Abnormal vaginal discharge.  The discharge is white, gray, or yellow with bacterial  vaginosis.  The discharge is thick, white, and cheesy with a yeast infection.  The discharge is frothy and yellow or greenish with trichomoniasis.  A bad vaginal odor.  The odor is fishy with bacterial vaginosis.  Vaginal itching, pain, or swelling.  Painful intercourse.  Pain or burning when urinating. Sometimes, there are no symptoms. TREATMENT  Treatment will vary depending on the type of infection.   Bacterial vaginosis and trichomoniasis are often treated with antibiotic creams or pills.  Yeast infections are often treated with antifungal medicines, such as vaginal creams or suppositories.  Viral vaginitis has no cure, but symptoms can be treated with medicines that relieve discomfort. Your sexual partner should be treated as well.  Atrophic vaginitis may be treated with an estrogen cream, pill, suppository, or vaginal ring. If vaginal dryness occurs, lubricants and moisturizing creams may help. You may be told to avoid scented soaps, sprays, or douches.  Allergic vaginitis treatment involves quitting the use of the product that is causing the problem. Vaginal creams can be used to treat the symptoms. HOME CARE INSTRUCTIONS   Take all medicines as directed by your caregiver.  Keep your genital area clean and dry. Avoid soap and only rinse the area with water.  Avoid douching. It can remove the healthy bacteria in the vagina.  Do not use tampons or have sexual intercourse until your vaginitis has been treated. Use sanitary pads while you have vaginitis.  Wipe from front to back. This avoids the spread of bacteria from the rectum to the vagina.  Let air reach your genital area.  Wear cotton underwear to decrease moisture buildup.  Avoid wearing underwear while you sleep until your vaginitis is gone.  Avoid tight pants and underwear  or nylons without a cotton panel.  Take off wet clothing (especially bathing suits) as soon as possible.  Use mild, non-scented  products. Avoid using irritants, such as:  Scented feminine sprays.  Fabric softeners.  Scented detergents.  Scented tampons.  Scented soaps or bubble baths.  Practice safe sex and use condoms. Condoms may prevent the spread of trichomoniasis and viral vaginitis. SEEK MEDICAL CARE IF:   You have abdominal pain.  You have a fever or persistent symptoms for more than 2-3 days.  You have a fever and your symptoms suddenly get worse. Document Released: 06/15/2007 Document Revised: 05/12/2012 Document Reviewed: 01/29/2012 [facility] Patient Information 2015 [place], [place]. This information is not intended to replace advice given to you by your health care provider. Make sure you discuss any questions you have with your health care provider.

## 2015-05-20 NOTE — ED Notes (Signed)
Patient c/o urinary tract infection symptoms including abdominal cramping, vaginal discharge, and spotting of blood x 2 weeks. Patient reports she has been taking AZO tablets and Tylenol with mild relief. Patient is in NAD.

## 2015-05-20 NOTE — ED Provider Notes (Signed)
CSN: [id number]     Arrival date & time 05/20/15  1441 History   None    Chief Complaint  Patient presents with  . Urinary Tract Infection  . Vaginal Discharge   (Consider location/radiation/quality/duration/timing/severity/associated sxs/prior Treatment) Patient is a 34 y.o. female presenting with frequency and vaginal discharge. The history is provided by the patient. No language interpreter was used.  Urinary Frequency This is a new problem. Episode onset: 2 wks ago. The problem occurs hourly (associated with suprapubic pain and at times flank pain. She has pain with sex as well.). The problem has not changed since onset.Associated symptoms include abdominal pain. Pertinent negatives include no chest pain, no headaches and no shortness of breath. Associated symptoms comments: Suprapubic pain. The symptoms are aggravated by intercourse. Nothing (OTC medication AZO and tylenol did not help.) relieves the symptoms.  Vaginal Discharge Vaginal discharge characteristics: mucus like. Severity:  Mild Onset quality:  Gradual Duration:  4 days Timing:  Constant Progression:  Unable to specify Context comment:  Pain with intercourse Relieved by:  Nothing Associated symptoms: abdominal pain and dyspareunia   Associated symptoms: no dysuria   Risk factors: no STI exposure   Risk factors comment:  Hx of Gonorrhea and Chlamydia in the past. Sexually active with same partner, uses condom regularly   Past Medical History  Diagnosis Date  . History of miscarriage   . Asthma   . Headache(784.0)   . Vaginal Pap smear, abnormal     cervical cancer  . Infection     UTI  . HSV infection    Past Surgical History  Procedure Laterality Date  . Dilatation & curettage/hysteroscopy with trueclear  2003, 2006, 2006  . Gynecologic cryosurgery  2007  . Dilation and curettage of uterus    . Tubal ligation N/A 11/03/2014    Procedure: POST PARTUM TUBAL LIGATION;  Surgeon: Adam Phenix, MD;  Location: [facility];  Service: Gynecology;  Laterality: N/A;   Family History  Problem Relation Age of Onset  . Diabetes Mother   . Hearing loss Neg Hx    Social History  Substance Use Topics  . Smoking status: Current Some Day Smoker -- 0.25 packs/day for 4 years    Types: Cigarettes  . Smokeless tobacco: Never Used     Comment: off and on  . Alcohol Use: 0.6 oz/week    1 Shots of liquor per week   OB History    Gravida Para Term Preterm AB TAB SAB Ectopic Multiple Living   0 6     Review of Systems  Respiratory: Negative.  Negative for shortness of breath.   Cardiovascular: Negative.  Negative for chest pain.  Gastrointestinal: Positive for abdominal pain.  Genitourinary: Positive for frequency, vaginal discharge, vaginal pain and dyspareunia. Negative for dysuria, hematuria, genital sores and menstrual problem.  Musculoskeletal: Negative.   Neurological: Negative for headaches.  All other systems reviewed and are negative.   Allergies  Review of patient's allergies indicates no known allergies.  Home Medications   Prior to Admission medications   Medication Sig Start Date End Date Taking? Authorizing Provider  acetaminophen (TYLENOL) 325 MG tablet Take 650 mg by mouth every 6 (six) hours as needed for mild pain.    Historical Provider, MD  albuterol (PROVENTIL HFA;VENTOLIN HFA) 108 (90 BASE) MCG/ACT inhaler Inhale 2 puffs into the lungs every 6 (six) hours as needed for wheezing.    Historical Provider, MD  cetirizine (ZYRTEC) 10 MG tablet Take 10 mg by mouth daily as needed for allergies.    Historical Provider, MD  diltiazem 2 % GEL Apply 1 application topically 3 (three) times daily. 11/21/14   Linna Hoff, MD  diphenhydrAMINE (BENADRYL) 50 MG capsule Take 50 mg by mouth every 6 (six) hours as needed.    Historical Provider, MD  ibuprofen (ADVIL,MOTRIN) 600 MG tablet Take 1 tablet (600 mg total) by mouth every 6 (six) hours as needed. 01/14/15   Marny Lowenstein, PA-C   oxyCODONE-acetaminophen (PERCOCET/ROXICET) 5-325 MG per tablet Take 10 tablets by mouth every 4 (four) hours as needed (pain). Patient not taking: Reported on 12/07/2014 11/04/14   Bluford Main, MD  valACYclovir (VALTREX) 500 MG tablet Take 1 tablet (500 mg total) by mouth 2 (two) times daily. 08/03/14   Tereso Newcomer, MD   Meds Ordered and Administered this Visit  Medications - No data to display  There were no vitals taken for this visit. No data found.   Physical Exam  Constitutional: She is oriented to person, place, and time. She appears well-developed. No distress.  Cardiovascular: Normal rate, regular rhythm, normal heart sounds and intact distal pulses.   No murmur heard. Pulmonary/Chest: Effort normal and breath sounds normal. No respiratory distress. She has no wheezes.  Abdominal: Soft. Bowel sounds are normal. She exhibits no distension and no mass. There is no tenderness.  Genitourinary: Uterus normal. There is no rash, tenderness or lesion on the right labia. There is no rash, tenderness or lesion on the left labia. Cervix exhibits motion tenderness. Right adnexum displays no mass and no tenderness. Left adnexum displays no mass and no tenderness. Vaginal discharge found.  Neurological: She is alert and oriented to person, place, and time.  Nursing note and vitals reviewed.   ED Course  Procedures (including critical care time)  Labs Review Labs Reviewed  POCT URINALYSIS DIP (DEVICE) - Abnormal; Notable for the following:    Protein, ur 30 (*)    Urobilinogen, UA 4.0 (*)    All other components within normal limits    Imaging Review No results found.   Visual Acuity Review  Right Eye Distance:   Left Eye Distance:   Bilateral Distance:    Right Eye Near:   Left Eye Near:    Bilateral Near:         MDM  No diagnosis found. Urine frequency. Suprapubic pain Vaginitis.  Urinalysis negative for infection, however there is presence of protein and  urobilirubin. Plan to recheck UA with PCP, to consider LFT if persistent.  Urine pregnancy test also negative.  Vagina specimen sent to lab for GC/ Chlamydia and wet prep. I went ahead and treated her for Gonorrhea and Chlamydia since she had this in the past. I will call her with test result. Tylenol as needed for suprapubic pain. Return precaution discussed.    Doreene Eland, MD 05/20/15 [phone]

## 2015-05-21 LAB — CERVICOVAGINAL ANCILLARY ONLY
CHLAMYDIA, DNA PROBE: NEGATIVE
NEISSERIA GONORRHEA: NEGATIVE

## 2015-05-22 LAB — CERVICOVAGINAL ANCILLARY ONLY: WET PREP (BD AFFIRM): NEGATIVE

## 2015-05-22 NOTE — ED Notes (Signed)
Patient called to inquire about her lab results, and after verifying ID, discussed her negatives labs. Advised to use OTC medication ,with food ,for her pain symptoms until she can be evaluated more in depth by her PCP, as her infection type labs as re all negative.

## 2015-05-22 NOTE — ED Notes (Signed)
Final reports of vaginal swabs available for review. Negative for yeast, trichomonas, GC, chlamydia, garnerella

## 2015-06-18 IMAGING — US US TRANSVAGINAL NON-OB
1 series · 13 of 25 positions shown · non-contrast
Comparison: Pelvic ultrasound 07/31/2014

CLINICAL DATA: Patient with abnormal uterine bleeding and pelvic
pain.



[Series 1: us non-ob tv/pel · 13 of 97 slices shown]
[im 1/97]
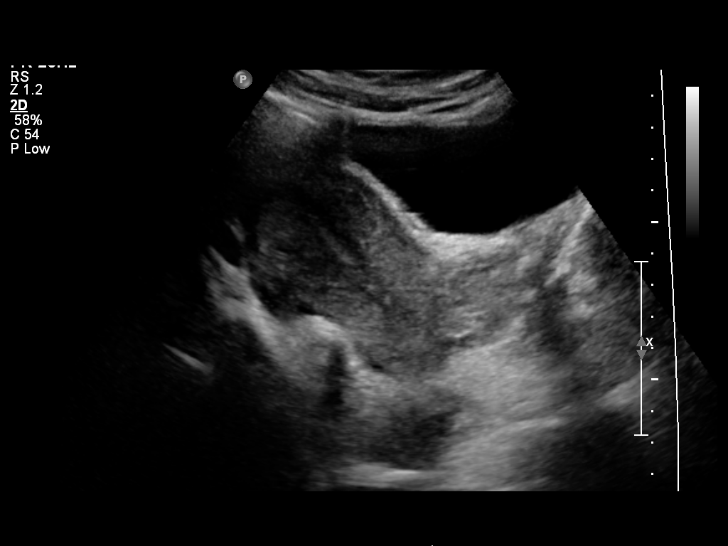
[im 9/97]
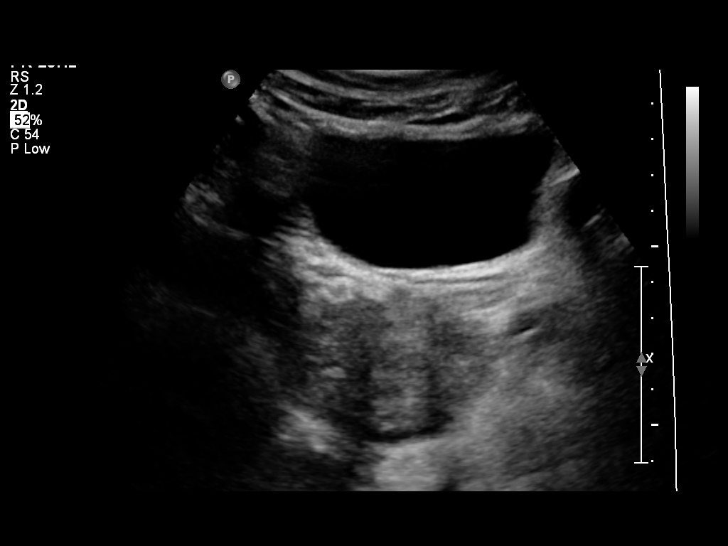
[im 17/97]
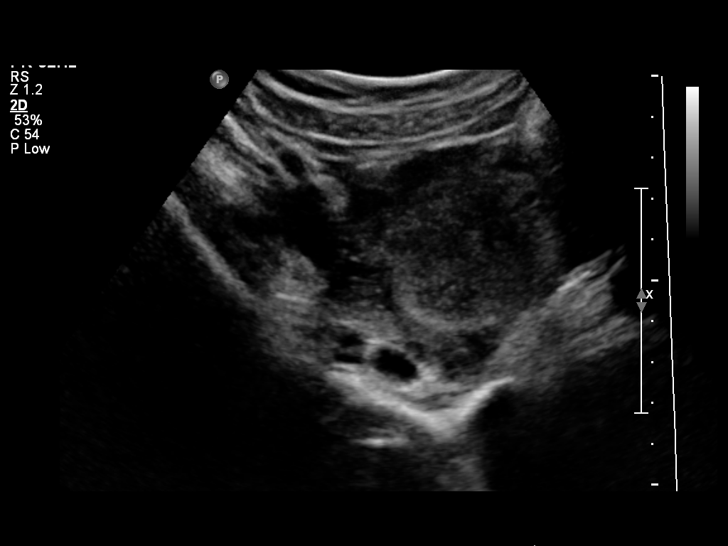
[im 25/97]
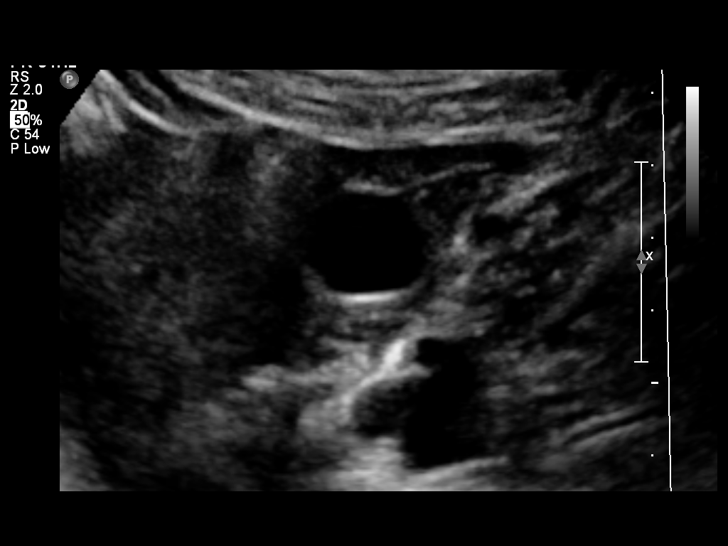
[im 33/97]
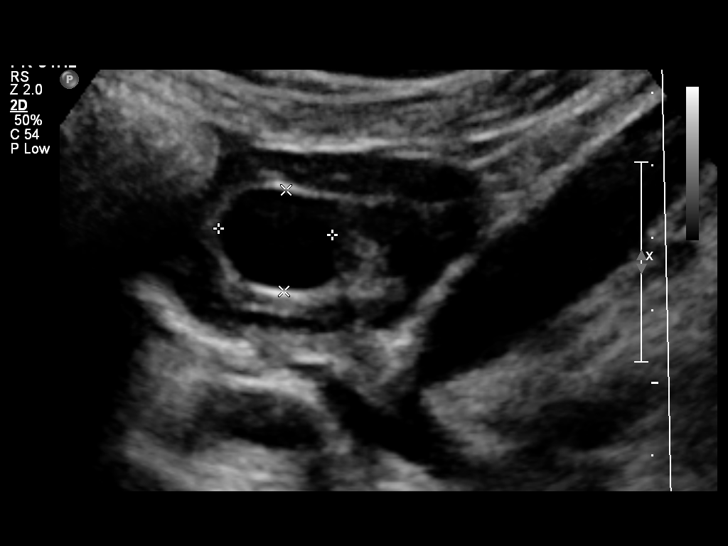
[im 41/97]
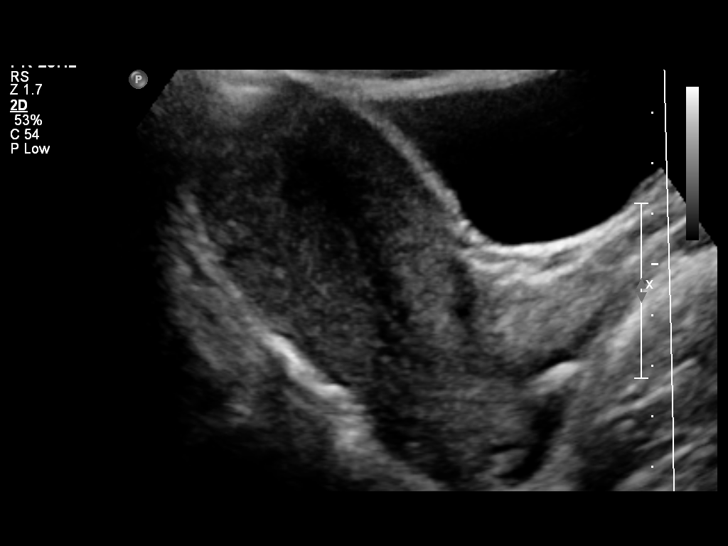
[im 49/97]
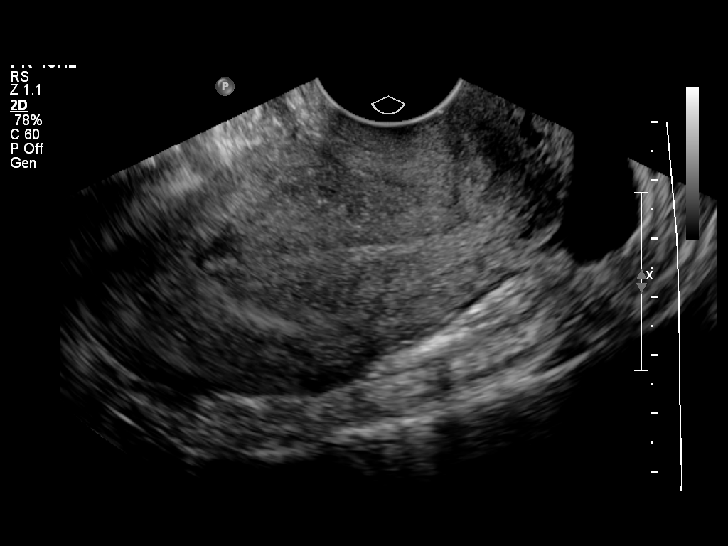
[im 57/97]
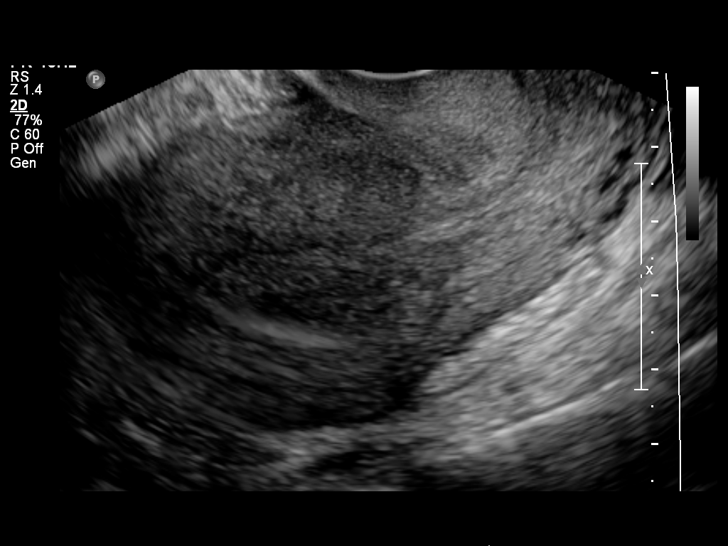
[im 65/97]
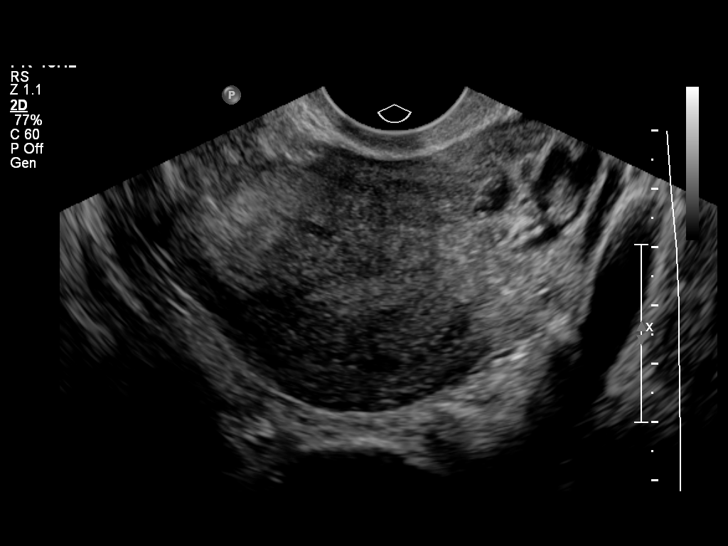
[im 73/97]
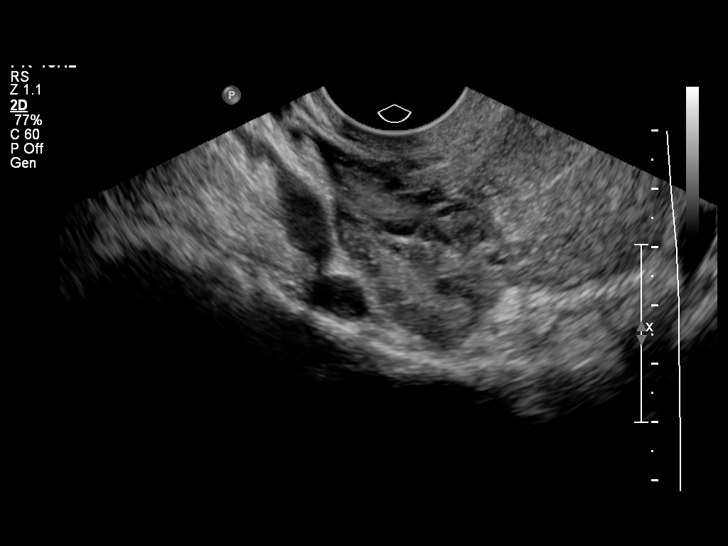
[im 81/97]
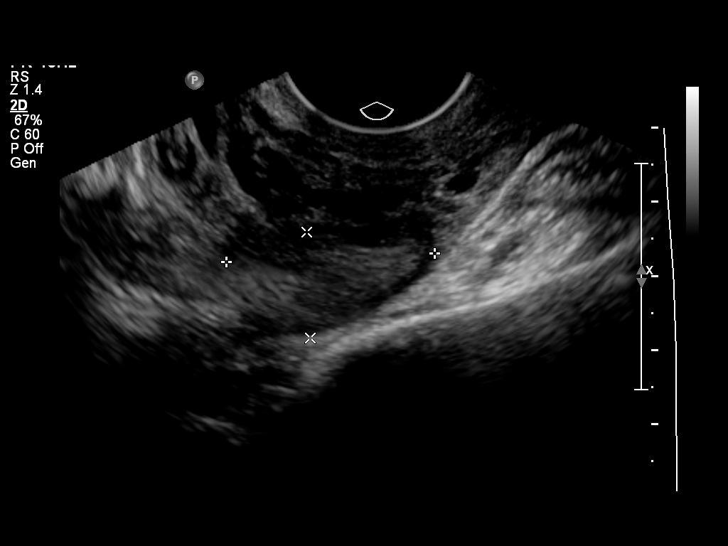
[im 89/97]
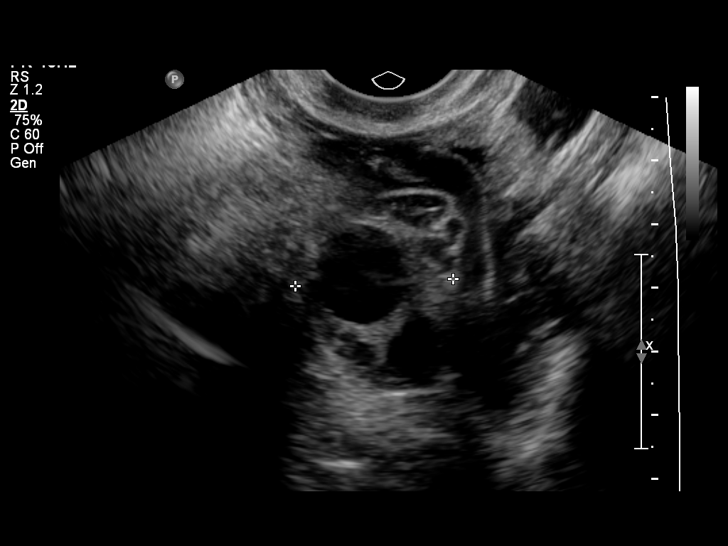
[im 97/97]
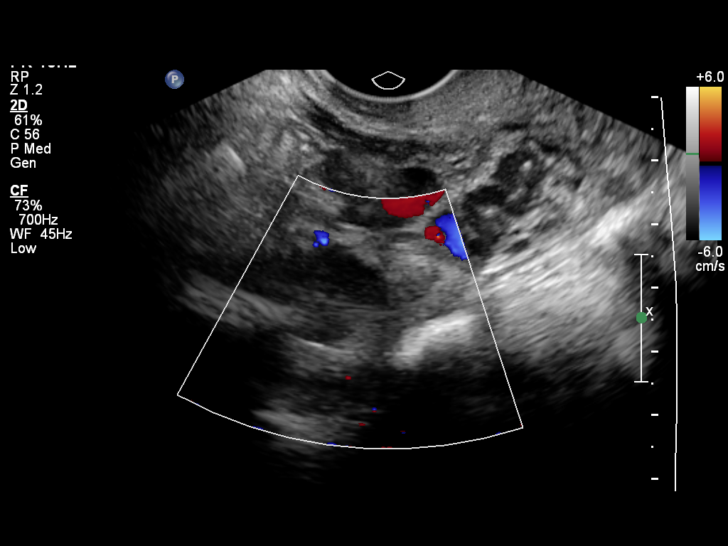

[13 of 25 positions shown; findings below may reference images not displayed]

FINDINGS: Uterus

Measurements: 8.4 x 3.9 x 5.0 cm. No fibroids or other mass
visualized.

Endometrium

Thickness: 6 mm.  No focal abnormality visualized.

Right ovary

Measurements: 2.8 x 1.4 x 2.2 cm. Normal appearance/no adnexal mass.

Left ovary

Measurements: 3.1 x 3.1 x 2.5 cm. There is a 1.8 x 1.7 x 1.4 cm
prominent cyst/ follicle in the left ovary.

Other findings

Small amount of free fluid in the pelvis.
IMPRESSION: Endometrium measures 6 mm. If bleeding remains unresponsive to
hormonal or medical therapy, sonohysterogram should be considered
for focal lesion work-up. (Ref: Radiological Reasoning: Algorithmic
Workup of Abnormal Vaginal Bleeding with Endovaginal Sonography and
Sonohysterography. [id number]; 191:[id number])

## 2015-07-09 ENCOUNTER — Encounter: Payer: Self-pay | Admitting: *Deleted

## 2015-08-13 ENCOUNTER — Emergency Department (HOSPITAL_COMMUNITY)
Admission: EM | Admit: 2015-08-13 | Discharge: 2015-08-13 | Disposition: A | Discharge status: Home / Self Care | Payer: Medicaid Other | Source: Home / Self Care | Attending: Family Medicine | Admitting: Family Medicine

## 2015-08-13 ENCOUNTER — Other Ambulatory Visit (HOSPITAL_COMMUNITY)
Admission: RE | Admit: 2015-08-13 | Discharge: 2015-08-13 | Disposition: A | Discharge status: Home / Self Care | Payer: Medicaid Other | Source: Ambulatory Visit | Attending: Family Medicine | Admitting: Family Medicine

## 2015-08-13 ENCOUNTER — Encounter (HOSPITAL_COMMUNITY): Payer: Self-pay | Admitting: Emergency Medicine

## 2015-08-13 DIAGNOSIS — Z113 Encounter for screening for infections with a predominantly sexual mode of transmission: Secondary | ICD-10-CM | POA: Insufficient documentation

## 2015-08-13 DIAGNOSIS — R102 Pelvic and perineal pain: Secondary | ICD-10-CM

## 2015-08-13 DIAGNOSIS — N760 Acute vaginitis: Secondary | ICD-10-CM

## 2015-08-13 DIAGNOSIS — R350 Frequency of micturition: Secondary | ICD-10-CM | POA: Diagnosis not present

## 2015-08-13 DIAGNOSIS — N730 Acute parametritis and pelvic cellulitis: Secondary | ICD-10-CM

## 2015-08-13 DIAGNOSIS — R300 Dysuria: Secondary | ICD-10-CM

## 2015-08-13 DIAGNOSIS — N72 Inflammatory disease of cervix uteri: Secondary | ICD-10-CM

## 2015-08-13 LAB — POCT URINALYSIS DIP (DEVICE)
Bilirubin Urine: NEGATIVE
GLUCOSE, UA: NEGATIVE mg/dL
KETONES UR: NEGATIVE mg/dL
Leukocytes, UA: NEGATIVE
NITRITE: NEGATIVE
PROTEIN: NEGATIVE mg/dL
Specific Gravity, Urine: 1.02 (ref 1.005–1.030)
Urobilinogen, UA: 1 mg/dL (ref 0.0–1.0)
pH: 7 (ref 5.0–8.0)

## 2015-08-13 LAB — POCT PREGNANCY, URINE: PREG TEST UR: NEGATIVE

## 2015-08-13 MED ORDER — AZITHROMYCIN 250 MG PO TABS
ORAL_TABLET | ORAL | Status: AC
  Filled 2015-08-13: qty 4

## 2015-08-13 MED ORDER — LIDOCAINE HCL (PF) 1 % IJ SOLN
INTRAMUSCULAR | Status: AC
  Filled 2015-08-13: qty 5

## 2015-08-13 MED ORDER — AZITHROMYCIN 250 MG PO TABS
1000.0000 mg | ORAL_TABLET | Freq: Once | ORAL | Status: AC
  Administered 2015-08-13: 1000 mg via ORAL

## 2015-08-13 MED ORDER — METRONIDAZOLE 500 MG PO TABS: 500.0000 mg | ORAL_TABLET | Freq: Two times a day (BID) | ORAL | Status: DC

## 2015-08-13 MED ORDER — CEFTRIAXONE SODIUM 250 MG IJ SOLR
INTRAMUSCULAR | Status: AC
  Filled 2015-08-13: qty 250

## 2015-08-13 MED ORDER — CEPHALEXIN 500 MG PO CAPS: 500.0000 mg | ORAL_CAPSULE | Freq: Four times a day (QID) | ORAL | Status: DC

## 2015-08-13 MED ORDER — CEFTRIAXONE SODIUM 250 MG IJ SOLR
250.0000 mg | Freq: Once | INTRAMUSCULAR | Status: AC
  Administered 2015-08-13: 250 mg via INTRAMUSCULAR

## 2015-08-13 NOTE — ED Notes (Signed)
The patient presented to the [facility] with a complaint of abdominal pain, dysuria, frequency and urgency of urination that has been ongoing for 2 months. The patient stated that she was here in August for the same symptoms and it has been ongoing since that visit. The patient stated that she has been using OTC Pyridium.

## 2015-08-13 NOTE — ED Provider Notes (Signed)
CSN: [id number]     Arrival date & time 08/13/15  1456 History   First MD Initiated Contact with Patient 08/13/15 1635     Chief Complaint  Patient presents with  . Abdominal Pain  . Dysuria   (Consider location/radiation/quality/duration/timing/severity/associated sxs/prior Treatment) HPI Comments: 34 year old female presents to the urgent care with an ongoing condition of dysuria, urgency, urinary frequency and pelvic pain. She denies fever or discharge. She was seen at this urgent care in September for similar complaints in addition to a vaginal discharge. At that visit the urine showed some protein and blood and based on her symptoms was treated empirically with Rocephin and azithromycin. The cervical cytology came back negative for gonorrhea or chlamydia.  The patient is now complaining of pelvic pain without discharge as well as the urinary symptoms as listed above. She has not followed up with a PCP and has not attempted to obtain one. She is on Medicaid.   Past Medical History  Diagnosis Date  . History of miscarriage   . Asthma   . Headache(784.0)   . Vaginal Pap smear, abnormal     cervical cancer  . Infection     UTI  . HSV infection    Past Surgical History  Procedure Laterality Date  . Dilatation & curettage/hysteroscopy with trueclear  2003, 2006, 2006  . Gynecologic cryosurgery  2007  . Dilation and curettage of uterus    . Tubal ligation N/A 11/03/2014    Procedure: POST PARTUM TUBAL LIGATION;  Surgeon: Adam Phenix, MD;  Location: [facility];  Service: Gynecology;  Laterality: N/A;   Family History  Problem Relation Age of Onset  . Diabetes Mother   . Hearing loss Neg Hx    Social History  Substance Use Topics  . Smoking status: Current Some Day Smoker -- 0.25 packs/day for 4 years    Types: Cigarettes  . Smokeless tobacco: Never Used     Comment: off and on  . Alcohol Use: 0.6 oz/week    1 Shots of liquor per week   OB History    Gravida Para Term  Preterm AB TAB SAB Ectopic Multiple Living   0 6     Review of Systems  Constitutional: Negative for fever and activity change.  HENT: Negative.   Respiratory: Negative.   Cardiovascular: Negative.   Gastrointestinal: Negative.   Genitourinary: Positive for dysuria, urgency, frequency and pelvic pain. Negative for vaginal discharge.  Skin: Negative.   Neurological: Negative.   All other systems reviewed and are negative.   Allergies  Review of patient's allergies indicates no known allergies.  Home Medications   Prior to Admission medications   Medication Sig Start Date End Date Taking? Authorizing Provider  acetaminophen (TYLENOL) 325 MG tablet Take 650 mg by mouth every 6 (six) hours as needed for mild pain.    Historical Provider, MD  albuterol (PROVENTIL HFA;VENTOLIN HFA) 108 (90 BASE) MCG/ACT inhaler Inhale 2 puffs into the lungs every 6 (six) hours as needed for wheezing.    Historical Provider, MD  cephALEXin (KEFLEX) 500 MG capsule Take 1 capsule (500 mg total) by mouth 4 (four) times daily. 08/13/15   Hayden Rasmussen, NP  cetirizine (ZYRTEC) 10 MG tablet Take 10 mg by mouth daily as needed for allergies.    Historical Provider, MD  diltiazem 2 % GEL Apply 1 application topically 3 (three) times daily. 11/21/14   Linna Hoff, MD  diphenhydrAMINE (BENADRYL)  50 MG capsule Take 50 mg by mouth every 6 (six) hours as needed.    Historical Provider, MD  ibuprofen (ADVIL,MOTRIN) 600 MG tablet Take 1 tablet (600 mg total) by mouth every 6 (six) hours as needed. 01/14/15   Marny LowensteinJulie N Wenzel, PA-C  metroNIDAZOLE (FLAGYL) 500 MG tablet Take 1 tablet (500 mg total) by mouth 2 (two) times daily. X 7 days 08/13/15   Hayden Rasmussenavid Romy Ipock, NP  valACYclovir (VALTREX) 500 MG tablet Take 1 tablet (500 mg total) by mouth 2 (two) times daily. 08/03/14   Tereso NewcomerUgonna A Anyanwu, MD   Meds Ordered and Administered this Visit   Medications  cefTRIAXone (ROCEPHIN) injection 250 mg (not administered)   azithromycin (ZITHROMAX) tablet 1,000 mg (not administered)    BP 149/99 mmHg  Pulse 58  Temp(Src) 98.3 F (36.8 C) (Oral)  Resp 16  SpO2 100%  LMP 07/21/2015 (Exact Date) No data found.   Physical Exam  Constitutional: She appears well-developed and well-nourished. No distress.  Neck: Normal range of motion. Neck supple.  Cardiovascular: Normal rate.   Pulmonary/Chest: Effort normal. No respiratory distress.  Abdominal: Soft. Bowel sounds are normal.  Abdominal tenderness. There is tenderness to the mid and right lower most abdomen and pelvis. Lesser tenderness to the left pelvis. This is consistent with the patient's identification of the anterior lower abdomen and pelvis as the source of discomfort.  Genitourinary:  Normal external female genitalia Vaginal walls and vaginal vault with thin white bubbly vaginal discharge. Cervix and ectocervix is pink and without lesions. Bimanual: Positive for moderate CMT and right adnexal tenderness. No left adnexal tenderness.  Musculoskeletal: She exhibits no edema.  Neurological: She is alert. She exhibits normal muscle tone.  Skin: Skin is warm and dry.  Nursing note and vitals reviewed.   ED Course  Procedures (including critical care time)  Labs Review Labs Reviewed  POCT URINALYSIS DIP (DEVICE) - Abnormal; Notable for the following:    Hgb urine dipstick TRACE (*)    All other components within normal limits  URINE CULTURE  POCT PREGNANCY, URINE  CERVICOVAGINAL ANCILLARY ONLY    Imaging Review No results found.   Visual Acuity Review  Right Eye Distance:   Left Eye Distance:   Bilateral Distance:    Right Eye Near:   Left Eye Near:    Bilateral Near:         MDM   1. Pelvic pain in female   2. Dysuria   3. Urinary frequency   4. Vaginitis   5. PID (acute pelvic inflammatory disease)   6. Cervicitis    The last 2 urinalysis did not include WBCs or nitrites. Urinalysis today included microscopic  hematuria only. Culture urine. Cytology pending. Rocephin 250 mg IM Azithromycin 1 g by mouth Flagyl 500 mg twice a day Keflex 500 mg Need to see your PCP. May need referral to urology. Symptoms may be due to interstitial cystitis.    Hayden Rasmussenavid Marquetta Weiskopf, NP 08/13/15 1710

## 2015-08-13 NOTE — Discharge Instructions (Signed)
Bacterial Vaginosis Bacterial vaginosis is a vaginal infection that occurs when the normal balance of bacteria in the vagina is disrupted. It results from an overgrowth of certain bacteria. This is the most common vaginal infection in women of childbearing age. Treatment is important to prevent complications, especially in pregnant women, as it can cause a premature delivery. CAUSES  Bacterial vaginosis is caused by an increase in harmful bacteria that are normally present in smaller amounts in the vagina. Several different kinds of bacteria can cause bacterial vaginosis. However, the reason that the condition develops is not fully understood. RISK FACTORS Certain activities or behaviors can put you at an increased risk of developing bacterial vaginosis, including:  Having a new sex partner or multiple sex partners.  Douching.  Using an intrauterine device (IUD) for contraception. Women do not get bacterial vaginosis from toilet seats, bedding, swimming pools, or contact with objects around them. SIGNS AND SYMPTOMS  Some women with bacterial vaginosis have no signs or symptoms. Common symptoms include:  Grey vaginal discharge.  A fishlike odor with discharge, especially after sexual intercourse.  Itching or burning of the vagina and vulva.  Burning or pain with urination. DIAGNOSIS  Your health care provider will take a medical history and examine the vagina for signs of bacterial vaginosis. A sample of vaginal fluid may be taken. Your health care provider will look at this sample under a microscope to check for bacteria and abnormal cells. A vaginal pH test may also be done.  TREATMENT  Bacterial vaginosis may be treated with antibiotic medicines. These may be given in the form of a pill or a vaginal cream. A second round of antibiotics may be prescribed if the condition comes back after treatment. Because bacterial vaginosis increases your risk for sexually transmitted diseases, getting  treated can help reduce your risk for chlamydia, gonorrhea, HIV, and herpes. HOME CARE INSTRUCTIONS   Only take over-the-counter or prescription medicines as directed by your health care provider.  If antibiotic medicine was prescribed, take it as directed. Make sure you finish it even if you start to feel better.  Tell all sexual partners that you have a vaginal infection. They should see their health care provider and be treated if they have problems, such as a mild rash or itching.  During treatment, it is important that you follow these instructions:  Avoid sexual activity or use condoms correctly.  Do not douche.  Avoid alcohol as directed by your health care provider.  Avoid breastfeeding as directed by your health care provider. SEEK MEDICAL CARE IF:   Your symptoms are not improving after 3 days of treatment.  You have increased discharge or pain.  You have a fever. MAKE SURE YOU:   Understand these instructions.  Will watch your condition.  Will get help right away if you are not doing well or get worse. FOR MORE INFORMATION  Centers for Disease Control and Prevention, Division of STD Prevention: SolutionApps.co.zawww.cdc.gov/std American Sexual Health Association (ASHA): www.ashastd.org    This information is not intended to replace advice given to you by your health care provider. Make sure you discuss any questions you have with your health care provider.   Document Released: 08/18/2005 Document Revised: 09/08/2014 Document Reviewed: 03/30/2013 Elsevier Interactive Patient Education 2016 [employer] Inc[employer]  Cervicitis Cervicitis is a soreness and swelling (inflammation) of the cervix. Your cervix is located at the bottom of your uterus. It opens up to the vagina. CAUSES   Sexually transmitted infections (STIs).  Allergic reaction.   Medicines or birth control devices that are put in the vagina.   Injury to the cervix.   Bacterial infections.  RISK FACTORS You are at  greater risk if you:  Have unprotected sexual intercourse.  Have sexual intercourse with many partners.  Began sexual intercourse at an early age.  Have a history of STIs. SYMPTOMS  There may be no symptoms. If symptoms occur, they may include:   Gray, white, yellow, or bad-smelling vaginal discharge.   Pain or itching of the area outside the vagina.   Painful sexual intercourse.   Lower abdominal or lower back pain, especially during intercourse.   Frequent urination.   Abnormal vaginal bleeding between periods, after sexual intercourse, or after menopause.   Pressure or a heavy feeling in the pelvis.  DIAGNOSIS  Diagnosis is made after a pelvic exam. Other tests may include:   Examination of any discharge under a microscope (wet prep).   A Pap test.  TREATMENT  Treatment will depend on the cause of cervicitis. If it is caused by an STI, both you and your partner will need to be treated. Antibiotic medicines will be given.  HOME CARE INSTRUCTIONS   Do not have sexual intercourse until your health care provider says it is okay.   Do not have sexual intercourse until your partner has been treated, if your cervicitis is caused by an STI.   Take your antibiotics as directed. Finish them even if you start to feel better.  SEEK MEDICAL CARE IF:  Your symptoms come back.   You have a fever.  MAKE SURE YOU:   Understand these instructions.  Will watch your condition.  Will get help right away if you are not doing well or get worse.   This information is not intended to replace advice given to you by your health care provider. Make sure you discuss any questions you have with your health care provider.   Document Released: 08/18/2005 Document Revised: 08/23/2013 Document Reviewed: 02/09/2013 Elsevier Interactive Patient Education 2016 Else[employer] Inc.  Dysuria Dysuria is pain or discomfort while urinating. The pain or discomfort may be felt in the tube  that carries urine out of the bladder (urethra) or in the surrounding tissue of the genitals. The pain may also be felt in the groin area, lower abdomen, and lower back. You may have to urinate frequently or have the sudden feeling that you have to urinate (urgency). Dysuria can affect both men and women, but is more common in women. Dysuria can be caused by many different things, including:  Urinary tract infection in women.  Infection of the kidney or bladder.  Kidney stones or bladder stones.  Certain sexually transmitted infections (STIs), such as chlamydia.  Dehydration.  Inflammation of the vagina.  Use of certain medicines.  Use of certain soaps or scented products that cause irritation. HOME CARE INSTRUCTIONS Watch your dysuria for any changes. The following actions may help to reduce any discomfort you are feeling:  Drink enough fluid to keep your urine clear or pale yellow.  Empty your bladder often. Avoid holding urine for long periods of time.  After a bowel movement or urination, women should cleanse from front to back, using each tissue only once.  Empty your bladder after sexual intercourse.  Take medicines only as directed by your health care provider.  If you were prescribed an antibiotic medicine, finish it all even if you start to feel better.  Avoid caffeine, tea, and alcohol.  They can irritate the bladder and make dysuria worse. In men, alcohol may irritate the prostate.  Keep all follow-up visits as directed by your health care provider. This is important.  If you had any tests done to find the cause of dysuria, it is your responsibility to obtain your test results. Ask the lab or department performing the test when and how you will get your results. Talk with your health care provider if you have any questions about your results. SEEK MEDICAL CARE IF:  You develop pain in your back or sides.  You have a fever.  You have nausea or vomiting.  You  have blood in your urine.  You are not urinating as often as you usually do. SEEK IMMEDIATE MEDICAL CARE IF:  You pain is severe and not relieved with medicines.  You are unable to hold down any fluids.  You or someone else notices a change in your mental function.  You have a rapid heartbeat at rest.  You have shaking or chills.  You feel extremely weak.   This information is not intended to replace advice given to you by your health care provider. Make sure you discuss any questions you have with your health care provider.   Document Released: 05/16/2004 Document Revised: 09/08/2014 Document Reviewed: 04/13/2014 Elsevier Interactive Patient Education 2016 Else[employer] Inc.  Pelvic Pain, Female Female pelvic pain can be caused by many different things and start from a variety of places. Pelvic pain refers to pain that is located in the lower half of the abdomen and between your hips. The pain may occur over a short period of time (acute) or may be reoccurring (chronic). The cause of pelvic pain may be related to disorders affecting the female reproductive organs (gynecologic), but it may also be related to the bladder, kidney stones, an intestinal complication, or muscle or skeletal problems. Getting help right away for pelvic pain is important, especially if there has been severe, sharp, or a sudden onset of unusual pain. It is also important to get help right away because some types of pelvic pain can be life threatening.  CAUSES  Below are only some of the causes of pelvic pain. The causes of pelvic pain can be in one of several categories.   Gynecologic.  Pelvic inflammatory disease.  Sexually transmitted infection.  Ovarian cyst or a twisted ovarian ligament (ovarian torsion).  Uterine lining that grows outside the uterus (endometriosis).  Fibroids, cysts, or tumors.  Ovulation.  Pregnancy.  Pregnancy that occurs outside the uterus (ectopic  pregnancy).  Miscarriage.  Labor.  Abruption of the placenta or ruptured uterus.  Infection.  Uterine infection (endometritis).  Bladder infection.  Diverticulitis.  Miscarriage related to a uterine infection (septic abortion).  Bladder.  Inflammation of the bladder (cystitis).  Kidney stone(s).  Gastrointestinal.  Constipation.  Diverticulitis.  Neurologic.  Trauma.  Feeling pelvic pain because of mental or emotional causes (psychosomatic).  Cancers of the bowel or pelvis. EVALUATION  Your caregiver will want to take a careful history of your concerns. This includes recent changes in your health, a careful gynecologic history of your periods (menses), and a sexual history. Obtaining your family history and medical history is also important. Your caregiver may suggest a pelvic exam. A pelvic exam will help identify the location and severity of the pain. It also helps in the evaluation of which organ system may be involved. In order to identify the cause of the pelvic pain and be properly treated, your caregiver  may order tests. These tests may include:   A pregnancy test.  Pelvic ultrasonography.  An X-ray exam of the abdomen.  A urinalysis or evaluation of vaginal discharge.  Blood tests. HOME CARE INSTRUCTIONS   Only take over-the-counter or prescription medicines for pain, discomfort, or fever as directed by your caregiver.   Rest as directed by your caregiver.   Eat a balanced diet.   Drink enough fluids to make your urine clear or pale yellow, or as directed.   Avoid sexual intercourse if it causes pain.   Apply warm or cold compresses to the lower abdomen depending on which one helps the pain.   Avoid stressful situations.   Keep a journal of your pelvic pain. Write down when it started, where the pain is located, and if there are things that seem to be associated with the pain, such as food or your menstrual cycle.  Follow up with your  caregiver as directed.  SEEK MEDICAL CARE IF:  Your medicine does not help your pain.  You have abnormal vaginal discharge. SEEK IMMEDIATE MEDICAL CARE IF:   You have heavy bleeding from the vagina.   Your pelvic pain increases.   You feel light-headed or faint.   You have chills.   You have pain with urination or blood in your urine.   You have uncontrolled diarrhea or vomiting.   You have a fever or persistent symptoms for more than 3 days.  You have a fever and your symptoms suddenly get worse.   You are being physically or sexually abused.   This information is not intended to replace advice given to you by your health care provider. Make sure you discuss any questions you have with your health care provider.   Document Released: 07/15/2004 Document Revised: 05/09/2015 Document Reviewed: 12/08/2011 Elsevier Interactive Patient Education 2016 [employer].  Urinary Frequency The number of times a normal person urinates depends upon how much liquid they take in and how much liquid they are losing. If the temperature is hot and there is high humidity, then the person will sweat more and usually breathe a little more frequently. These factors decrease the amount of frequency of urination that would be considered normal. The amount you drink is easily determined, but the amount of fluid lost is sometimes more difficult to calculate.  Fluid is lost in two ways:  Sensible fluid loss is usually measured by the amount of urine that you get rid of. Losses of fluid can also occur with diarrhea.  Insensible fluid loss is more difficult to measure. It is caused by evaporation. Insensible loss of fluid occurs through breathing and sweating. It usually ranges from a little less than a quart to a little more than a quart of fluid a day. In normal temperatures and activity levels, the average person may urinate 4 to 7 times in a 24-hour period. Needing to urinate more often than  that could indicate a problem. If one urinates 4 to 7 times in 24 hours and has large volumes each time, that could indicate a different problem from one who urinates 4 to 7 times a day and has small volumes. The time of urinating is also important. Most urinating should be done during the waking hours. Getting up at night to urinate frequently can indicate some problems. CAUSES  The bladder is the organ in your lower abdomen that holds urine. Like a balloon, it swells some as it fills up. Your nerves sense this and tell  you it is time to head for the bathroom. There are a number of reasons that you might feel the need to urinate more often than usual. They include:  Urinary tract infection. This is usually associated with other signs such as burning when you urinate.  In men, problems with the prostate (a walnut-size gland that is located near the tube that carries urine out of your body). There are two reasons why the prostate can cause an increased frequency of urination:  An enlarged prostate that does not let the bladder empty well. If the bladder only half empties when you urinate, then it only has half the capacity to fill before you have to urinate again.  The nerves in the bladder become more hypersensitive with an increased size of the prostate even if the bladder empties completely.  Pregnancy.  Obesity. Excess weight is more likely to cause a problem for women than for men.  Bladder stones or other bladder problems.  Caffeine.  Alcohol.  Medications. For example, drugs that help the body get rid of extra fluid (diuretics) increase urine production. Some other medicines must be taken with lots of fluids.  Muscle or nerve weakness. This might be the result of a spinal cord injury, a stroke, multiple sclerosis, or Parkinson disease.  Long-standing diabetes can decrease the sensation of the bladder. This loss of sensation makes it harder to sense the bladder needs to be emptied.  Over a period of years, the bladder is stretched out by constant overfilling. This weakens the bladder muscles so that the bladder does not empty well and has less capacity to fill with new urine.  Interstitial cystitis (also called painful bladder syndrome). This condition develops because the tissues that line the inside of the bladder are inflamed (inflammation is the body's way of reacting to injury or infection). It causes pain and frequent urination. It occurs in women more often than in men. DIAGNOSIS   To decide what might be causing your urinary frequency, your health care provider will probably:  Ask about symptoms you have noticed.  Ask about your overall health. This will include questions about any medications you are taking.  Do a physical examination.  Order some tests. These might include:  A blood test to check for diabetes or other health issues that could be contributing to the problem.  Urine testing. This could measure the flow of urine and the pressure on the bladder.  A test of your neurological system (the brain, spinal cord, and nerves). This is the system that senses the need to urinate.  A bladder test to check whether it is emptying completely when you urinate.  Cystoscopy. This test uses a thin tube with a tiny camera on it. It offers a look inside your urethra and bladder to see if there are problems.  Imaging tests. You might be given a contrast dye and then asked to urinate. X-rays are taken to see how your bladder is working. TREATMENT  It is important for you to be evaluated to determine if the amount or frequency that you have is unusual or abnormal. If it is found to be abnormal, the cause should be determined and this can usually be found out easily. Depending upon the cause, treatment could include medication, stimulation of the nerves, or surgery. There are not too many things that you can do as an individual to change your urinary frequency. It is  important that you balance the amount of fluid intake needed to compensate  for your activity and the temperature. Medical problems will be diagnosed and taken care of by your physician. There is no particular bladder training such as Kegel exercises that you can do to help urinary frequency. This is an exercise that is usually recommended for people who have leaking of urine when they laugh, cough, or sneeze. HOME CARE INSTRUCTIONS   Take any medications your health care provider prescribed or suggested. Follow the directions carefully.  Practice any lifestyle changes that are recommended. These might include:  Drinking less fluid or drinking at different times of the day. If you need to urinate often during the night, for example, you may need to stop drinking fluids early in the evening.  Cutting down on caffeine or alcohol. They both can make you need to urinate more often than normal. Caffeine is found in coffee, tea, and sodas.  Losing weight, if that is recommended.  Keep a journal or a log. You might be asked to record how much you drink and when and where you feel the need to urinate. This will also help evaluate how well the treatment provided by your physician is working. SEEK MEDICAL CARE IF:   Your need to urinate often gets worse.  You feel increased pain or irritation when you urinate.  You notice blood in your urine.  You have questions about any medications that your health care provider recommended.  You notice blood, pus, or swelling at the site of any test or treatment procedure.  You develop a fever of more than 100.1F (38.1C). SEEK IMMEDIATE MEDICAL CARE IF:  You develop a fever of more than 102.18F (38.9C).   This information is not intended to replace advice given to you by your health care provider. Make sure you discuss any questions you have with your health care provider.   Document Released: 06/14/2009 Document Revised: 09/08/2014 Document Reviewed:  06/14/2009 Elsevier Interactive Patient Education 2016 [employer].  Vaginitis Vaginitis is an inflammation of the vagina. It is most often caused by a change in the normal balance of the bacteria and yeast that live in the vagina. This change in balance causes an overgrowth of certain bacteria or yeast, which causes the inflammation. There are different types of vaginitis, but the most common types are:  Bacterial vaginosis.  Yeast infection (candidiasis).  Trichomoniasis vaginitis. This is a sexually transmitted infection (STI).  Viral vaginitis.  Atrophic vaginitis.  Allergic vaginitis. CAUSES  The cause depends on the type of vaginitis. Vaginitis can be caused by:  Bacteria (bacterial vaginosis).  Yeast (yeast infection).  A parasite (trichomoniasis vaginitis)  A virus (viral vaginitis).  Low hormone levels (atrophic vaginitis). Low hormone levels can occur during pregnancy, breastfeeding, or after menopause.  Irritants, such as bubble baths, scented tampons, and feminine sprays (allergic vaginitis). Other factors can change the normal balance of the yeast and bacteria that live in the vagina. These include:  Antibiotic medicines.  Poor hygiene.  Diaphragms, vaginal sponges, spermicides, birth control pills, and intrauterine devices (IUD).  Sexual intercourse.  Infection.  Uncontrolled diabetes.  A weakened immune system. SYMPTOMS  Symptoms can vary depending on the cause of the vaginitis. Common symptoms include:  Abnormal vaginal discharge.  The discharge is white, gray, or yellow with bacterial vaginosis.  The discharge is thick, white, and cheesy with a yeast infection.  The discharge is frothy and yellow or greenish with trichomoniasis.  A bad vaginal odor.  The odor is fishy with bacterial vaginosis.  Vaginal itching, pain, or  swelling.  Painful intercourse.  Pain or burning when urinating. Sometimes, there are no symptoms. TREATMENT   Treatment will vary depending on the type of infection.   Bacterial vaginosis and trichomoniasis are often treated with antibiotic creams or pills.  Yeast infections are often treated with antifungal medicines, such as vaginal creams or suppositories.  Viral vaginitis has no cure, but symptoms can be treated with medicines that relieve discomfort. Your sexual partner should be treated as well.  Atrophic vaginitis may be treated with an estrogen cream, pill, suppository, or vaginal ring. If vaginal dryness occurs, lubricants and moisturizing creams may help. You may be told to avoid scented soaps, sprays, or douches.  Allergic vaginitis treatment involves quitting the use of the product that is causing the problem. Vaginal creams can be used to treat the symptoms. HOME CARE INSTRUCTIONS   Take all medicines as directed by your caregiver.  Keep your genital area clean and dry. Avoid soap and only rinse the area with water.  Avoid douching. It can remove the healthy bacteria in the vagina.  Do not use tampons or have sexual intercourse until your vaginitis has been treated. Use sanitary pads while you have vaginitis.  Wipe from front to back. This avoids the spread of bacteria from the rectum to the vagina.  Let air reach your genital area.  Wear cotton underwear to decrease moisture buildup.  Avoid wearing underwear while you sleep until your vaginitis is gone.  Avoid tight pants and underwear or nylons without a cotton panel.  Take off wet clothing (especially bathing suits) as soon as possible.  Use mild, non-scented products. Avoid using irritants, such as:  Scented feminine sprays.  Fabric softeners.  Scented detergents.  Scented tampons.  Scented soaps or bubble baths.  Practice safe sex and use condoms. Condoms may prevent the spread of trichomoniasis and viral vaginitis. SEEK MEDICAL CARE IF:   You have abdominal pain.  You have a fever or persistent symptoms  for more than 2-3 days.  You have a fever and your symptoms suddenly get worse.   This information is not intended to replace advice given to you by your health care provider. Make sure you discuss any questions you have with your health care provider.   Document Released: 06/15/2007 Document Revised: 01/02/2015 Document Reviewed: 01/29/2012 Elsevier Interactive Patient Education [employer]

## 2015-08-14 LAB — CERVICOVAGINAL ANCILLARY ONLY
CHLAMYDIA, DNA PROBE: NEGATIVE
NEISSERIA GONORRHEA: NEGATIVE

## 2015-08-15 LAB — CERVICOVAGINAL ANCILLARY ONLY: Wet Prep (BD Affirm): NEGATIVE

## 2015-08-16 LAB — URINE CULTURE
Culture: >=100000
SPECIAL REQUESTS: NORMAL

## 2015-08-19 ENCOUNTER — Other Ambulatory Visit: Payer: Self-pay | Admitting: Obstetrics & Gynecology

## 2015-10-09 ENCOUNTER — Emergency Department (HOSPITAL_COMMUNITY)
Admission: EM | Admit: 2015-10-09 | Discharge: 2015-10-09 | Disposition: A | Discharge status: Home / Self Care | Payer: Medicaid Other | Attending: Emergency Medicine | Admitting: Emergency Medicine

## 2015-10-09 ENCOUNTER — Encounter (HOSPITAL_COMMUNITY): Payer: Self-pay

## 2015-10-09 ENCOUNTER — Emergency Department (HOSPITAL_COMMUNITY): Payer: Medicaid Other

## 2015-10-09 DIAGNOSIS — J45909 Unspecified asthma, uncomplicated: Secondary | ICD-10-CM | POA: Diagnosis not present

## 2015-10-09 DIAGNOSIS — Z87440 Personal history of urinary (tract) infections: Secondary | ICD-10-CM | POA: Insufficient documentation

## 2015-10-09 DIAGNOSIS — F17210 Nicotine dependence, cigarettes, uncomplicated: Secondary | ICD-10-CM | POA: Insufficient documentation

## 2015-10-09 DIAGNOSIS — R319 Hematuria, unspecified: Secondary | ICD-10-CM | POA: Insufficient documentation

## 2015-10-09 DIAGNOSIS — N83202 Unspecified ovarian cyst, left side: Secondary | ICD-10-CM | POA: Diagnosis not present

## 2015-10-09 DIAGNOSIS — Z79899 Other long term (current) drug therapy: Secondary | ICD-10-CM | POA: Insufficient documentation

## 2015-10-09 DIAGNOSIS — R102 Pelvic and perineal pain: Secondary | ICD-10-CM

## 2015-10-09 DIAGNOSIS — R197 Diarrhea, unspecified: Secondary | ICD-10-CM | POA: Insufficient documentation

## 2015-10-09 DIAGNOSIS — Z3202 Encounter for pregnancy test, result negative: Secondary | ICD-10-CM | POA: Insufficient documentation

## 2015-10-09 DIAGNOSIS — Z8619 Personal history of other infectious and parasitic diseases: Secondary | ICD-10-CM | POA: Insufficient documentation

## 2015-10-09 DIAGNOSIS — R1030 Lower abdominal pain, unspecified: Secondary | ICD-10-CM | POA: Diagnosis present

## 2015-10-09 LAB — URINALYSIS, ROUTINE W REFLEX MICROSCOPIC
BILIRUBIN URINE: NEGATIVE
Glucose, UA: NEGATIVE mg/dL
KETONES UR: NEGATIVE mg/dL
Leukocytes, UA: NEGATIVE
NITRITE: NEGATIVE
PROTEIN: NEGATIVE mg/dL
SPECIFIC GRAVITY, URINE: 1.021 (ref 1.005–1.030)
pH: 6.5 (ref 5.0–8.0)

## 2015-10-09 LAB — I-STAT BETA HCG BLOOD, ED (MC, WL, AP ONLY)

## 2015-10-09 LAB — CBC
HCT: 40.4 % (ref 36.0–46.0)
HEMOGLOBIN: 13.5 g/dL (ref 12.0–15.0)
MCH: 30.8 pg (ref 26.0–34.0)
MCHC: 33.4 g/dL (ref 30.0–36.0)
MCV: 92 fL (ref 78.0–100.0)
PLATELETS: 227 10*3/uL (ref 150–400)
RBC: 4.39 MIL/uL (ref 3.87–5.11)
RDW: 13.1 % (ref 11.5–15.5)
WBC: 6.2 10*3/uL (ref 4.0–10.5)

## 2015-10-09 LAB — COMPREHENSIVE METABOLIC PANEL
ALBUMIN: 3.9 g/dL (ref 3.5–5.0)
ALK PHOS: 69 U/L (ref 38–126)
ALT: 18 U/L (ref 14–54)
ANION GAP: 9 (ref 5–15)
AST: 20 U/L (ref 15–41)
BILIRUBIN TOTAL: 0.6 mg/dL (ref 0.3–1.2)
BUN: 6 mg/dL (ref 6–20)
CALCIUM: 9.5 mg/dL (ref 8.9–10.3)
CO2: 27 mmol/L (ref 22–32)
CREATININE: 0.72 mg/dL (ref 0.44–1.00)
Chloride: 105 mmol/L (ref 101–111)
GFR calc Af Amer: >60 mL/min (ref >60)
GFR calc non Af Amer: >60 mL/min (ref >60)
GLUCOSE: 99 mg/dL (ref 65–99)
Potassium: 4.3 mmol/L (ref 3.5–5.1)
Sodium: 141 mmol/L (ref 135–145)
TOTAL PROTEIN: 7 g/dL (ref 6.5–8.1)

## 2015-10-09 LAB — URINE MICROSCOPIC-ADD ON
Bacteria, UA: NONE SEEN
WBC, UA: NONE SEEN WBC/hpf (ref 0–5)

## 2015-10-09 LAB — LIPASE, BLOOD: Lipase: 31 U/L (ref 11–51)

## 2015-10-09 LAB — WET PREP, GENITAL
CLUE CELLS WET PREP: NONE SEEN
Sperm: NONE SEEN
Trich, Wet Prep: NONE SEEN
Yeast Wet Prep HPF POC: NONE SEEN

## 2015-10-09 MED ORDER — HYDROCODONE-ACETAMINOPHEN 5-325 MG PO TABS: 2.0000 | ORAL_TABLET | ORAL | Status: DC | PRN

## 2015-10-09 MED ORDER — MORPHINE SULFATE (PF) 4 MG/ML IV SOLN
4.0000 mg | Freq: Once | INTRAVENOUS | Status: AC
  Administered 2015-10-09: 4 mg via INTRAMUSCULAR
  Filled 2015-10-09: qty 1

## 2015-10-09 MED ORDER — MORPHINE SULFATE (PF) 4 MG/ML IV SOLN: 4.0000 mg | Freq: Once | INTRAVENOUS | Status: DC

## 2015-10-09 NOTE — ED Provider Notes (Signed)
CSN: [id number]     Arrival date & time 10/09/15  1734 History  By signing my name below, I, Tanda Rockers, attest that this documentation has been prepared under the direction and in the presence of Melburn Hake, PA-C. Electronically Signed: Tanda Rockers, ED Scribe. 10/09/2015. 7:58 PM.   Chief Complaint  Patient presents with  . Abdominal Pain   The history is provided by the patient. No language interpreter was used.     HPI Comments: Shelly Silva is a 35 y.o. female who presents to the Emergency Department complaining of gradual onset, constant, sharp, lower abdominal pain x 1 week. The pain is worse with movement and sitting. She has been taking Ibuprofen and Tylenol without relief. Pt has also been taking AZO with some relief. She reports seeing a small amount of streaky blood in her urine today, prompting her to come to the ED. Pt also complains of intermittent loose stools for the past month, right flank pain, urinary frequency, and pressure with urination. She had been eating and drinking normally. She reports similar symptoms in October 2016 and November 2016. Pt was [facility] at [facility] times and had a UA which showed trace hemoglobin in her urine but negative for UTI. Pt was placed on antibiotics both times with relief. She states she has a hx of UTIs and that her symptoms feel similar. Denies fever, nausea, vomiting, hematochezia, vaginal discharge, vaginal bleeding, dysuria, or any other associated symptoms. LNMP: 09/23/2015. PSHx tubal ligation.   Past Medical History  Diagnosis Date  . History of miscarriage   . Asthma   . Headache(784.0)   . Vaginal Pap smear, abnormal     cervical cancer  . Infection     UTI  . HSV infection    Past Surgical History  Procedure Laterality Date  . Dilatation & curettage/hysteroscopy with trueclear  2003, 2006, 2006  . Gynecologic cryosurgery  2007  . Dilation and curettage of uterus    . Tubal ligation N/A 11/03/2014    Procedure: POST  PARTUM TUBAL LIGATION;  Surgeon: Adam Phenix, MD;  Location: [facility];  Service: Gynecology;  Laterality: N/A;   Family History  Problem Relation Age of Onset  . Diabetes Mother   . Hearing loss Neg Hx    Social History  Substance Use Topics  . Smoking status: Current Some Day Smoker -- 0.25 packs/day for 4 years    Types: Cigarettes  . Smokeless tobacco: Never Used     Comment: off and on  . Alcohol Use: 0.6 oz/week    1 Shots of liquor per week   OB History    Gravida Para Term Preterm AB TAB SAB Ectopic Multiple Living   0 6     Review of Systems  Constitutional: Negative for fever.  Gastrointestinal: Positive for abdominal pain and diarrhea. Negative for nausea, vomiting and blood in stool.  Genitourinary: Positive for frequency and hematuria. Negative for dysuria, vaginal bleeding and vaginal discharge.    Allergies  Review of patient's allergies indicates no known allergies.  Home Medications   Prior to Admission medications   Medication Sig Start Date End Date Taking? Authorizing Provider  acetaminophen (TYLENOL) 325 MG tablet Take 650 mg by mouth every 6 (six) hours as needed for mild pain.   Yes Historical Provider, MD  albuterol (PROVENTIL HFA;VENTOLIN HFA) 108 (90 BASE) MCG/ACT inhaler Inhale 2 puffs into the lungs every 6 (six) hours as needed  for wheezing.   Yes Historical Provider, MD  cetirizine (ZYRTEC) 10 MG tablet Take 10 mg by mouth daily as needed for allergies.   Yes Historical Provider, MD  diphenhydrAMINE (BENADRYL) 50 MG capsule Take 50 mg by mouth every 6 (six) hours as needed for allergies.    Yes Historical Provider, MD  valACYclovir (VALTREX) 500 MG tablet TAKE 1 TABLET (500 MG TOTAL) BY MOUTH 2 (TWO) TIMES DAILY. 08/20/15  Yes Tereso Newcomer, MD  HYDROcodone-acetaminophen (NORCO/VICODIN) 5-325 MG tablet Take 2 tablets by mouth every 4 (four) hours as needed. 10/09/15   Satira Sark Marzella Miracle, PA-C   BP 128/92 mmHg  Pulse 79   Temp(Src) 98.9 F (37.2 C) (Oral)  Resp 16  SpO2 99%  LMP 09/28/2015   Physical Exam  Constitutional: She is oriented to person, place, and time. She appears well-developed and well-nourished. No distress.  HENT:  Head: Normocephalic and atraumatic.  Right Ear: Tympanic membrane normal.  Left Ear: Tympanic membrane normal.  Nose: Nose normal.  Mouth/Throat: Uvula is midline, oropharynx is clear and moist and mucous membranes are normal. No oropharyngeal exudate, posterior oropharyngeal edema, posterior oropharyngeal erythema or tonsillar abscesses.  Eyes: Conjunctivae and EOM are normal. Right eye exhibits no discharge. Left eye exhibits no discharge. No scleral icterus.  Neck: Normal range of motion. Neck supple. No tracheal deviation present.  Cardiovascular: Normal rate, regular rhythm, normal heart sounds and intact distal pulses.   Pulmonary/Chest: Effort normal and breath sounds normal. No respiratory distress. She has no wheezes. She has no rales. She exhibits no tenderness.  Abdominal: Soft. Bowel sounds are normal. She exhibits no distension and no mass. There is tenderness. There is no rebound, no guarding and no CVA tenderness.  Mild diffuse tenderness  Musculoskeletal: Normal range of motion. She exhibits no edema.  Lymphadenopathy:    She has no cervical adenopathy.  Neurological: She is alert and oriented to person, place, and time.  Skin: Skin is warm and dry.  Psychiatric: She has a normal mood and affect. Her behavior is normal.  Nursing note and vitals reviewed.   ED Course  Procedures (including critical care time)  DIAGNOSTIC STUDIES: Oxygen Saturation is 99% on RA, normal by my interpretation.    COORDINATION OF CARE: 7:57 PM-Discussed treatment plan which includes UA with pt at bedside and pt agreed to plan.   Labs Review Labs Reviewed  WET PREP, GENITAL - Abnormal; Notable for the following:    WBC, Wet Prep HPF POC MANY (*)    All other components  within normal limits  URINALYSIS, ROUTINE W REFLEX MICROSCOPIC (NOT AT [facility]) - Abnormal; Notable for the following:    Hgb urine dipstick TRACE (*)    All other components within normal limits  URINE MICROSCOPIC-ADD ON - Abnormal; Notable for the following:    Squamous Epithelial / LPF 6-30 (*)    All other components within normal limits  LIPASE, BLOOD  COMPREHENSIVE METABOLIC PANEL  CBC  I-STAT BETA HCG BLOOD, ED ([facility], [facility], [facility] ONLY)  GC/CHLAMYDIA PROBE AMP ([place]) NOT AT [facility]    Imaging Review US Transvaginal Non-ob  10/09/2015  CLINICAL DATA:  Suprapubic and RIGHT lower quadrant pain for 3 months. History of BILATERAL tubal ligation. Gravida 6 para 6. No reported abnormal bleeding. EXAM: TRANSABDOMINAL AND TRANSVAGINAL ULTRASOUND OF PELVIS TECHNIQUE: Both transabdominal and transvaginal ultrasound examinations of the pelvis were performed. Transabdominal technique was performed for global imaging of the pelvis including uterus, ovaries, adnexal regions, and pelvic  cul-de-sac. It was necessary to proceed with endovaginal exam following the transabdominal exam to visualize the endometrium and ovaries. COMPARISON:  None FINDINGS: Uterus Measurements: 7.5 x 4.6 x 5.7 cm. No fibroids or other mass visualized. Endometrium Thickness: 6.4 mm. No focal abnormality visualized. LMP 09/28/2015. Right ovary Measurements: 2.4 x 1.6 x 1.8 cm. Normal appearance/no adnexal mass. Left ovary Measurements: 4.3 x 2.2 x 3.4 cm containing 2 cysts measuring 1.8 x 1.5 x 1.7 cm and 1.1 by 0.9 x 1.2 cm. Normal appearance/no adnexal mass. Other findings Small amount of free fluid. IMPRESSION: LEFT ovarian cysts.  No evidence for pelvic inflammation or mass. Electronically Signed   By: Elsie Stain M.D.   On: 10/09/2015 23:01   US Pelvis Complete  10/09/2015  CLINICAL DATA:  Suprapubic and RIGHT lower quadrant pain for 3 months. History of BILATERAL tubal ligation. Gravida 6 para 6. No reported abnormal bleeding. EXAM:  TRANSABDOMINAL AND TRANSVAGINAL ULTRASOUND OF PELVIS TECHNIQUE: Both transabdominal and transvaginal ultrasound examinations of the pelvis were performed. Transabdominal technique was performed for global imaging of the pelvis including uterus, ovaries, adnexal regions, and pelvic cul-de-sac. It was necessary to proceed with endovaginal exam following the transabdominal exam to visualize the endometrium and ovaries. COMPARISON:  None FINDINGS: Uterus Measurements: 7.5 x 4.6 x 5.7 cm. No fibroids or other mass visualized. Endometrium Thickness: 6.4 mm. No focal abnormality visualized. LMP 09/28/2015. Right ovary Measurements: 2.4 x 1.6 x 1.8 cm. Normal appearance/no adnexal mass. Left ovary Measurements: 4.3 x 2.2 x 3.4 cm containing 2 cysts measuring 1.8 x 1.5 x 1.7 cm and 1.1 by 0.9 x 1.2 cm. Normal appearance/no adnexal mass. Other findings Small amount of free fluid. IMPRESSION: LEFT ovarian cysts.  No evidence for pelvic inflammation or mass. Electronically Signed   By: Elsie Stain M.D.   On: 10/09/2015 23:01   I have personally reviewed and evaluated these lab results as part of my medical decision-making.  Pelvic exam: normal external genitalia, vulva, vagina, cervix, uterus and adnexa, VULVA: normal appearing vulva with no masses, tenderness or lesions, VAGINA: normal appearing vagina with normal color and discharge, no lesions, vaginal discharge - clear and mucoid, WET MOUNT done - results: white blood cells, DNA probe for chlamydia and GC obtained, CERVIX: normal appearing cervix without discharge or lesions, UTERUS: uterus is normal size, shape, consistency, tenderness with palpation of uterus, ADNEXA: normal adnexa in size, nontender and no masses, exam chaperoned by female nurse.   MDM   Final diagnoses:  Left ovarian cyst  Hematuria   Pt presents with lower abdominal pain and single episode of hematuria. Patient reports being seen multiple times over the past few months for similar  symptoms at urgent care, diagnosed with UTI and sent home with antibiotics with resolution of symptoms. VSS. Exam revealed diffuse abdominal tenderness, no peritoneal signs, remaining exam unremarkable. Pelvic exam revealed clear mucoid discharge in vaginal vault, tenderness with palpation of uterus, no CMT, no adnexal tenderness. Due to tenderness on pelvic exam will order ultrasound for further evaluation. Pregnancy negative. Blood work unremarkable. UA showed trace hemoglobin and 0-5 rbc's. Wet prep positive for WBCs. Pelvic ultrasound revealed left ovarian cysts, no evidence for pelvic inflammation or mass. I suspect patient's symptoms are likely due to to ovarian cysts, plan to discharge patient home with symptomatic treatment patient given OB/GYN follow-up. Advised patient to follow up with her PCP regarding her chronic hematuria. Discussed with patient to have her urine rechecked during her PCP visit and advised her  to follow up with urology for further evaluation for hematuria.  Evaluation does not show pathology requring ongoing emergent intervention or admission. Pt is hemodynamically stable and mentating appropriately. Discussed findings/results and plan with patient/guardian, who agrees with plan. All questions answered. Return precautions discussed and outpatient follow up given.    I personally performed the services described in this documentation, which was scribed in my presence. The recorded information has been reviewed and is accurate.      Satira Sark [place], [place] 10/09/15 [id number]  Mancel Bale, MD 10/11/15 [phone]

## 2015-10-09 NOTE — Discharge Instructions (Signed)
Take your medication as prescribed. I recommend taking 800 mg ibuprofen 3 times daily as needed for pain relief. Please follow up with a primary care provider from the Resource Guide provided below in 4-5 days regarding your hematuria (trace hgb in urine) which was seen on your workup in the emergency department today and possible need for referral for further evaluation by urology. I also recommend following up with gynecology regarding your ovarian cyst. Please return to the Emergency Department if symptoms worsen or new onset of fever, abdominal pain, vomiting, diarrhea, blood in urine or stool, vaginal bleeding, vaginal discharge.   Emergency Department Resource Guide 1) Find a Doctor and Pay Out of Pocket Although you won't have to find out who is covered by your insurance plan, it is a good idea to ask around and get recommendations. You will then need to call the office and see if the doctor you have chosen will accept you as a new patient and what types of options they offer for patients who are self-pay. Some doctors offer discounts or will set up payment plans for their patients who do not have insurance, but you will need to ask so you aren't surprised when you get to your appointment.  2) Contact Your Local Health Department Not all health departments have doctors that can see patients for sick visits, but many do, so it is worth a call to see if yours does. If you don't know where your local health department is, you can check in your phone book. The CDC also has a tool to help you locate your state's health department, and many state websites also have listings of all of their local health departments.  3) Find a Walk-in Clinic If your illness is not likely to be very severe or complicated, you may want to try a walk in clinic. These are popping up all over the country in pharmacies, drugstores, and shopping centers. They're usually staffed by nurse practitioners or physician assistants  that have been trained to treat common illnesses and complaints. They're usually fairly quick and inexpensive. However, if you have serious medical issues or chronic medical problems, these are probably not your best option.  No Primary Care Doctor: - Call Health Connect at  [phone] - they can help you locate a primary care doctor that  accepts your insurance, provides certain services, etc. - Physician Referral Service- [phone]  Chronic Pain Problems: Organization         Address  Phone   Notes  [facility]  [phone] Patients need to be referred by their primary care doctor.   Medication Assistance: Organization         Address  Phone   Notes  [facility] Medication [facility] [address] [place] [place], [place] [address] [phone] --Must be a resident of [facility] -- Must have NO insurance coverage whatsoever (no Medicaid/ Medicare, etc.) -- The pt. MUST have a primary care doctor that directs their care regularly and follows them in the community   [facility]ist  [phone]   [employer]  [phone]    Agencies that provide inexpensive medical care: Organization         Address  Phone   Notes  [facility]  [phone]   [facility]    [phone]   [facility] [address] [place], [place] [address] [phone]   [facility]  [address] [place] [phone]   Planned Parenthood    [phone]   [facility]    [phone]   [facility]  [address], [place] Phone:  [phone], Fax:  [phone] Hours of Operation:  9 am - 6 pm, M-F.  Also accepts Medicaid/Medicare and self-pay.  [facility]  [address], [place] Phone: [phone], Fax: [phone]. Hours of Operation:  8:30 am - 5:30 pm, M-F.  Also accepts Medicaid  and self-pay.  [facility] [place] [address] [place] Phone: [phone]   [facility] [address] Natasha Bence [place], [place] [phone], Ext. [phone] Mondays & Thursdays: 7-9 AM.  First 15 patients are seen on a first come, first serve basis.    Medicaid-accepting [facility] Providers:  Organization         Address  Phone   Notes  [facility] [address], [place] [phone] Also accepts self-pay patients.  [facility] [address] Laurell Josephs [place], [place]  [phone]   [facility] [address], [place] [phone]   [facility] [address] [place] [phone]   Renaye Rakers [address], [place]   [phone] Only accepts [place] Access [place] patients after they have their name applied to their card.   Self-Pay (no insurance) in [facility]:  Organization         Address  Phone   Notes  Sickle Cell Patients, [facility] [address] [place], [place] [phone]   [facility] [address] [place], [place] [phone]   [facility] [place]  [address], [place] [phone]   [facility]/Dr. Osei-Bonsu  [address] [place] or [address], [place] [phone] Phone number for both [place] and [place] locations is the same.  [facility] [address], [place] [phone]   [facility] [address] [place] or [address] [phone]   [facility] [address] [place] [phone], phone; [phone], fax Sees patients 1st and 3rd Saturday of every month.  Must not qualify for public or private insurance (i.e. Medicaid, Medicare, [place] Health Choice, Veterans' Benefits)  Household income  should be no more than 200% of the poverty level The clinic cannot treat you if you are pregnant or think you are pregnant  Sexually transmitted diseases are not treated at the clinic.    Dental Care: Organization         Address  Phone  Notes  [facility] Department of [facility] [address] [place], [place] [phone] Accepts children up to age 62 who are enrolled in [place] or [place] Health Choice; pregnant women with a Medicaid card; and children who have applied for Medicaid or [place] Health Choice, but were declined, whose parents can pay a reduced fee at time of service.  [facility]  [address], [place] [phone] Accepts children up to age 70 who are enrolled in [place] or [place] Health Choice; pregnant women with a Medicaid card; and children who have applied  for Medicaid or [place] Health Choice, but were declined, whose parents can pay a reduced fee at time of service.  [facility]  [address] [place], [place] [phone] Patients are seen by appointment only. Walk-ins are not accepted. [facility] will see patients 28 years of age and older. Monday - Tuesday (8am-5pm) Most Wednesdays (8:30-5pm) $30 per visit, cash only  [facility]  [address], [facility] [phone] Patients are seen by appointment only. Walk-ins are not accepted. [facility] will see patients 5 years of age and older. One Wednesday Evening (Monthly: Volunteer Based).  $30 per visit, cash only  [employer]  [phone] for adults; Children under age 82, call Graduate Pediatric Dentistry at [phone]. Children aged 42-14, please call [phone] to request a pediatric application.  Dental services are provided in all areas of dental care including fillings, crowns and bridges, complete and partial dentures, implants, gum treatment,  root canals, and extractions. Preventive care is also provided. Treatment is provided to both adults and children. Patients are selected via a lottery and there is often a waiting list.   [facility] [address] [place]  [phone] [email or web address]   [facility] [address] [place], [place] [phone], Ext. [phone] Second and Fourth Thursday of each month, opens at 6:30 AM; Clinic ends at 9 AM.  Patients are seen on a first-come first-served basis, and a limited number are seen during each clinic.   [facility]  [address] Ether Griffins [place], [place] [phone]   Eligibility Requirements You must have lived in [place], [place], or [place] counties for at least the last three months.   You cannot be eligible for state or federal sponsored [place], including [employer], [place], or [employer].   You generally cannot be eligible for healthcare insurance through your employer.    How to apply: Eligibility screenings are held every Tuesday and Wednesday afternoon from 1:00 pm until 4:00 pm. You do not need an appointment for the interview!  [facility] [address] [place], [place] [phone]   [facility] Health Department  [phone]   [facility]  [phone]   [facility] Health Department  [phone]    Behavioral Health Resources in the Community: Intensive Outpatient Programs Organization         Address  Phone  Notes  [facility] [address], [place], [place] [phone]   [facility] [address] [place], [place] [phone]   [facility] [address], [place], [place]  [phone]   [facility] Mental Health [address],  [place], [place] [phone] or [phone]   Substance Abuse Resources Organization         Address  Phone  Notes  [facility]   [phone]   [facility]  [phone]   [facility] [place]  [phone]   Floydene Flock  [phone]   Residential & Outpatient Substance Abuse Program  [phone]   Psychological Services Organization         Address  Phone  Notes  [facility] Behavioral Health  [phone]   [facility]  [phone]   [facility] Mental [facility] [address] [place] [phone] or [phone]    Mobile Crisis Teams Organization  Address  Phone  Notes  [facility], Mobile Crisis Care Unit  [phone]   [facility]  [address] [place], [place] [phone]   [facility] [address] [place] [phone]    Self-Help/Support Groups Organization         Address  Phone             Notes  [facility] [place] - variety of support groups  336- I7437963 Call for more information  Narcotics Anonymous (NA), [facility] [address], [employer] [place]  2 meetings at this location   Statistician         Address  Phone  Notes  [facility] [address],    [place]  [phone]   [facility]  [address], [place] [address], [place], [place] [phone]   [facility] [address] [place], [place] [phone] Admissions: 8am-3pm M-F  [facility] [address],    [place], [place] [phone]   [facility] [address] [place], [place], [place] [phone]   [facility] [address]  [place], [place] [phone]   [facility] - Intensive Outpatient [address] Laurell Josephs [address], [place], [place] [phone]   [facility] ([facility] [address] [place]  [place], [place] [phone] or [phone]   [facility] ([facility]) [address], [place], [place] [phone] Accepts Medicaid  [facility] [place] [address]  [place] [phone] Substance Abuse/Addiction Treatment   [facility] Organization         Address  Phone  Notes  [facility]  [phone]   Angie Fava, PhD [address] Ervin Knack [place], [place]   [phone] or [phone]   [facility]   [address] [place], [place] [phone]   [facility] [address] [place], [place] [phone] Insurance/Medicaid/sponsorship through [facility] [address]                                    [place], [place] [phone] Therapy/tele-psych/case  [facility] [address], [place] [phone]    Dr. Lolly Mustache  [phone]   [facility]  [employer] [facility] Dept. 1) [address] [place] 2) [address], [place] 3)  [address], [place] [phone]  [phone]   [facility] Child Abuse Hotline 205 780 2732 or [phone] (After Hours)

## 2015-10-09 NOTE — ED Notes (Signed)
Patient is alert and orientedx4.  Patient was explained discharge instructions and they understood them with no questions.  The patient's fiancee, Peri Jefferson is taking the patient home.

## 2015-10-09 NOTE — ED Notes (Signed)
Pt reports generalized abd pain ongoing since October. She reports she thinks she has a UTI and describes symptoms such as urinary frequency and pressure with urination. She has been to [facility] this but has been told twice that she does not have a UTI but did find traces of blood in urine.

## 2015-10-09 NOTE — ED Notes (Signed)
Patient transported to Ultrasound 

## 2015-10-10 LAB — GC/CHLAMYDIA PROBE AMP (~~LOC~~) NOT AT ARMC
Chlamydia: NEGATIVE
NEISSERIA GONORRHEA: NEGATIVE

## 2015-10-25 ENCOUNTER — Encounter: Payer: Self-pay | Admitting: Obstetrics & Gynecology

## 2015-10-25 ENCOUNTER — Ambulatory Visit (INDEPENDENT_AMBULATORY_CARE_PROVIDER_SITE_OTHER): Payer: Medicaid Other | Admitting: Obstetrics & Gynecology

## 2015-10-25 VITALS — BP 124/67 | HR 69 | Temp 99.0°F | Ht 64.0 in | Wt 159.0 lb

## 2015-10-25 DIAGNOSIS — N83202 Unspecified ovarian cyst, left side: Secondary | ICD-10-CM | POA: Diagnosis not present

## 2015-10-25 DIAGNOSIS — N839 Noninflammatory disorder of ovary, fallopian tube and broad ligament, unspecified: Secondary | ICD-10-CM | POA: Diagnosis not present

## 2015-10-25 MED ORDER — LEVONORGEST-ETH ESTRAD 91-DAY 0.15-0.03 MG PO TABS
1.0000 | ORAL_TABLET | Freq: Every day | ORAL | Status: DC
Start: 2015-10-25 — End: 2016-03-15

## 2015-10-25 NOTE — Patient Instructions (Signed)
Return to clinic for any scheduled appointments or for any gynecologic concerns as needed.   

## 2015-10-25 NOTE — Progress Notes (Signed)
CLINIC ENCOUNTER NOTE  History:  35 y.o. [id number] here today for discussion of management of recurrent ovarian cysts. Seen in ED on 10/09/2015 for pain, noted to have small physiologic cysts. Patient reports that she gets this very often.  She denies any current abnormal vaginal discharge, bleeding, pelvic pain or other concerns.  She is s/p tubal ligation.  Past Medical History  Diagnosis Date  . History of miscarriage   . Asthma   . Headache(784.0)   . Vaginal Pap smear, abnormal     cervical cancer  . Infection     UTI  . HSV infection   . S/P tubal ligation 11/04/2014    Past Surgical History  Procedure Laterality Date  . Dilatation & curettage/hysteroscopy with trueclear  2003, 2006, 2006  . Gynecologic cryosurgery  2007  . Dilation and curettage of uterus    . Tubal ligation N/A 11/03/2014    Procedure: POST PARTUM TUBAL LIGATION;  Surgeon: Adam Phenix, MD;  Location: [facility];  Service: Gynecology;  Laterality: N/A;    The following portions of the patient's history were reviewed and updated as appropriate: allergies, current medications, past family history, past medical history, past social history, past surgical history and problem list.   Health Maintenance:  Normal pap and negative HRHPV on 04/15/2014.    Review of Systems:  Pertinent items noted in HPI and remainder of comprehensive ROS otherwise negative.  Objective:  Physical Exam BP 124/67 mmHg  Pulse 69  Temp(Src) 99 F (37.2 C) (Oral)  Ht  (1.626 m)  Wt 159 lb (72.122 kg)  BMI 27.28 kg/m2  LMP 10/23/2015 CONSTITUTIONAL: Well-developed, well-nourished female in no acute distress.  HENT:  Normocephalic, atraumatic. External right and left ear normal. Oropharynx is clear and moist EYES: Conjunctivae and EOM are normal. Pupils are equal, round, and reactive to light. No scleral icterus.  NECK: Normal range of motion, supple, no masses SKIN: Skin is warm and dry. No rash noted. Not diaphoretic. No  erythema. No pallor. NEUROLOGIC: Alert and oriented to person, place, and time. Normal reflexes, muscle tone coordination. No cranial nerve deficit noted. PSYCHIATRIC: Normal mood and affect. Normal behavior. Normal judgment and thought content. CARDIOVASCULAR: Normal heart rate noted RESPIRATORY: Effort and breath sounds normal, no problems with respiration noted ABDOMEN: Soft, no distention noted.   PELVIC: Deferred MUSCULOSKELETAL: Normal range of motion. No edema noted.  Labs and Imaging US Transvaginal Non-ob  10/09/2015  CLINICAL DATA:  Suprapubic and RIGHT lower quadrant pain for 3 months. History of BILATERAL tubal ligation. Gravida 6 para 6. No reported abnormal bleeding. EXAM: TRANSABDOMINAL AND TRANSVAGINAL ULTRASOUND OF PELVIS TECHNIQUE: Both transabdominal and transvaginal ultrasound examinations of the pelvis were performed. Transabdominal technique was performed for global imaging of the pelvis including uterus, ovaries, adnexal regions, and pelvic cul-de-sac. It was necessary to proceed with endovaginal exam following the transabdominal exam to visualize the endometrium and ovaries. COMPARISON:  None FINDINGS: Uterus Measurements: 7.5 x 4.6 x 5.7 cm. No fibroids or other mass visualized. Endometrium Thickness: 6.4 mm. No focal abnormality visualized. LMP 09/28/2015. Right ovary Measurements: 2.4 x 1.6 x 1.8 cm. Normal appearance/no adnexal mass. Left ovary Measurements: 4.3 x 2.2 x 3.4 cm containing 2 cysts measuring 1.8 x 1.5 x 1.7 cm and 1.1 by 0.9 x 1.2 cm. Normal appearance/no adnexal mass. Other findings Small amount of free fluid. IMPRESSION: LEFT ovarian cysts.  No evidence for pelvic inflammation or mass. Electronically Signed   By: Dale [place].D.  On: 10/09/2015 23:01   US Pelvis Complete  10/09/2015  CLINICAL DATA:  Suprapubic and RIGHT lower quadrant pain for 3 months. History of BILATERAL tubal ligation. Gravida 6 para 6. No reported abnormal bleeding. EXAM:  TRANSABDOMINAL AND TRANSVAGINAL ULTRASOUND OF PELVIS TECHNIQUE: Both transabdominal and transvaginal ultrasound examinations of the pelvis were performed. Transabdominal technique was performed for global imaging of the pelvis including uterus, ovaries, adnexal regions, and pelvic cul-de-sac. It was necessary to proceed with endovaginal exam following the transabdominal exam to visualize the endometrium and ovaries. COMPARISON:  None FINDINGS: Uterus Measurements: 7.5 x 4.6 x 5.7 cm. No fibroids or other mass visualized. Endometrium Thickness: 6.4 mm. No focal abnormality visualized. LMP 09/28/2015. Right ovary Measurements: 2.4 x 1.6 x 1.8 cm. Normal appearance/no adnexal mass. Left ovary Measurements: 4.3 x 2.2 x 3.4 cm containing 2 cysts measuring 1.8 x 1.5 x 1.7 cm and 1.1 by 0.9 x 1.2 cm. Normal appearance/no adnexal mass. Other findings Small amount of free fluid. IMPRESSION: LEFT ovarian cysts.  No evidence for pelvic inflammation or mass. Electronically Signed   By: Elsie Stain M.D.   On: 10/09/2015 23:01    Assessment & Plan:  1. Cyst of left ovary 2. Problems with ovulation Counseled about suppressing ovulation given recurrent ovarian cysts. She agreed.  - levonorgestrel-ethinyl estradiol (SEASONALE,INTROVALE,JOLESSA) 0.15-0.03 MG tablet; Take 1 tablet by mouth daily.  Dispense: 1 Package; Refill: 4 Routine preventative health maintenance measures emphasized. Please refer to After Visit Summary for other counseling recommendations.   Return in about 2 months (around 12/23/2015) for OCP/BP check.   Total face-to-face time with patient: 15 minutes. Over 50% of encounter was spent on counseling and coordination of care.   Jaynie Collins, MD, FACOG Attending Obstetrician & Gynecologist, [facility] and Center for [facility]

## 2015-11-06 ENCOUNTER — Emergency Department (HOSPITAL_COMMUNITY)
Admission: EM | Admit: 2015-11-06 | Discharge: 2015-11-06 | Disposition: A | Discharge status: Home / Self Care | Payer: Medicaid Other | Source: Home / Self Care | Attending: Family Medicine | Admitting: Family Medicine

## 2015-11-06 ENCOUNTER — Encounter (HOSPITAL_COMMUNITY): Payer: Self-pay

## 2015-11-06 DIAGNOSIS — L0291 Cutaneous abscess, unspecified: Secondary | ICD-10-CM | POA: Diagnosis not present

## 2015-11-06 MED ORDER — SULFAMETHOXAZOLE-TRIMETHOPRIM 800-160 MG PO TABS: 1.0000 | ORAL_TABLET | Freq: Two times a day (BID) | ORAL | Status: AC

## 2015-11-06 MED ORDER — PENTAFLUOROPROP-TETRAFLUOROETH EX AERO
INHALATION_SPRAY | CUTANEOUS | Status: AC
  Filled 2015-11-06: qty 103.5

## 2015-11-06 NOTE — Discharge Instructions (Signed)
Abscess An abscess is an infected area that contains a collection of pus and debris.It can occur in almost any part of the body. An abscess is also known as a furuncle or boil. CAUSES  An abscess occurs when tissue gets infected. This can occur from blockage of oil or sweat glands, infection of hair follicles, or a minor injury to the skin. As the body tries to fight the infection, pus collects in the area and creates pressure under the skin. This pressure causes pain. People with weakened immune systems have difficulty fighting infections and get certain abscesses more often.  SYMPTOMS Usually an abscess develops on the skin and becomes a painful mass that is red, warm, and tender. If the abscess forms under the skin, you may feel a moveable soft area under the skin. Some abscesses break open (rupture) on their own, but most will continue to get worse without care. The infection can spread deeper into the body and eventually into the bloodstream, causing you to feel ill.  DIAGNOSIS  Your caregiver will take your medical history and perform a physical exam. A sample of fluid may also be taken from the abscess to determine what is causing your infection. TREATMENT  Your caregiver may prescribe antibiotic medicines to fight the infection. However, taking antibiotics alone usually does not cure an abscess. Your caregiver may need to make a small cut (incision) in the abscess to drain the pus. In some cases, gauze is packed into the abscess to reduce pain and to continue draining the area. HOME CARE INSTRUCTIONS   Only take over-the-counter or prescription medicines for pain, discomfort, or fever as directed by your caregiver.  If you were prescribed antibiotics, take them as directed. Finish them even if you start to feel better.  If gauze is used, follow your caregiver's directions for changing the gauze.  To avoid spreading the infection:  Keep your draining abscess covered with a  bandage.  Wash your hands well.  Do not share personal care items, towels, or whirlpools with others.  Avoid skin contact with others.  Keep your skin and clothes clean around the abscess.  Keep all follow-up appointments as directed by your caregiver. SEEK MEDICAL CARE IF:  1. You have increased pain, swelling, redness, fluid drainage, or bleeding. 2. You have muscle aches, chills, or a general ill feeling. 3. You have a fever. MAKE SURE YOU:   Understand these instructions.  Will watch your condition.  Will get help right away if you are not doing well or get worse.   This information is not intended to replace advice given to you by your health care provider. Make sure you discuss any questions you have with your health care provider.   Document Released: 05/28/2005 Document Revised: 02/17/2012 Document Reviewed: 10/31/2011 Elsevier Interactive Patient Education 2016 Elsevier Inc.  Incision and Drainage Incision and drainage is a procedure in which a sac-like structure (cystic structure) is opened and drained. The area to be drained usually contains material such as pus, fluid, or blood.  LET YOUR CAREGIVER KNOW ABOUT:   Allergies to medicine.  Medicines taken, including vitamins, herbs, eyedrops, over-the-counter medicines, and creams.  Use of steroids (by mouth or creams).  Previous problems with anesthetics or numbing medicines.  History of bleeding problems or blood clots.  Previous surgery.  Other health problems, including diabetes and kidney problems.  Possibility of pregnancy, if this applies. RISKS AND COMPLICATIONS 4. Pain. 5. Bleeding. 6. Scarring. 7. Infection. BEFORE THE PROCEDURE  You may need to have an ultrasound or other imaging tests to see how large or deep your cystic structure is. Blood tests may also be used to determine if you have an infection or how severe the infection is. You may need to have a tetanus shot. PROCEDURE  The affected  area is cleaned with a cleaning fluid. The cyst area will then be numbed with a medicine (local anesthetic). A small incision will be made in the cystic structure. A syringe or catheter may be used to drain the contents of the cystic structure, or the contents may be squeezed out. The area will then be flushed with a cleansing solution. After cleansing the area, it is often gently packed with a gauze or another wound dressing. Once it is packed, it will be covered with gauze and tape or some other type of wound dressing. AFTER THE PROCEDURE   Often, you will be allowed to go home right after the procedure.  You may be given antibiotic medicine to prevent or heal an infection.  If the area was packed with gauze or some other wound dressing, you will likely need to come back in 1 to 2 days to get it removed.  The area should heal in about 14 days.   This information is not intended to replace advice given to you by your health care provider. Make sure you discuss any questions you have with your health care provider.   Document Released: 02/11/2001 Document Revised: 02/17/2012 Document Reviewed: 10/13/2011 Elsevier Interactive Patient Education 2016 [employer]. How to Take a [employer] A sitz bath is a warm water bath that is taken while you are sitting down. The water should only come up to your hips and should cover your buttocks. Your health care provider may recommend a sitz bath to help you:   Clean the lower part of your body, including your genital area.  With itching.  With pain.  With sore muscles or muscles that tighten or spasm. HOW TO TAKE A SITZ BATH Take 3-4 sitz baths per day or as told by your health care provider. 8. Partially fill a bathtub with warm water. You will only need the water to be deep enough to cover your hips and buttocks when you are sitting in it. 9. If your health care provider told you to put medicine in the water, follow the directions  exactly. 10. Sit in the water and open the tub drain a little. 11. Turn on the warm water again to keep the tub at the correct level. Keep the water running constantly. 12. Soak in the water for 15-20 minutes or as told by your health care provider. 13. After the sitz bath, pat the affected area dry first. Do not rub it. 14. Be careful when you stand up after the sitz bath because you may feel dizzy. SEEK MEDICAL CARE IF:  Your symptoms get worse. Do not continue with sitz baths if your symptoms get worse.  You have new symptoms. Do not continue with sitz baths until you talk with your health care provider.   This information is not intended to replace advice given to you by your health care provider. Make sure you discuss any questions you have with your health care provider.   Document Released: 05/10/2004 Document Revised: 01/02/2015 Document Reviewed: 08/16/2014 Elsevier Interactive Patient Education [employer]

## 2015-11-06 NOTE — ED Notes (Signed)
Patient states she may have a possible spider bite she noticed it x3 days ago and she has noticed white spots around the bite. Hydrocortisone cream  No acute distress

## 2015-11-06 NOTE — ED Provider Notes (Signed)
CSN: [id number]     Arrival date & time 11/06/15  1715 History   First MD Initiated Contact with Patient 11/06/15 1818     Chief Complaint  Patient presents with  . 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 History of miscarriage   . Asthma   . Headache(784.0)   . Vaginal Pap smear, abnormal     cervical cancer  . Infection     UTI  . HSV infection   . S/P tubal ligation 11/04/2014   Past Surgical History  Procedure Laterality Date  . Dilatation & curettage/hysteroscopy with trueclear  2003, 2006, 2006  . Gynecologic cryosurgery  2007  . Dilation and curettage of uterus    . Tubal ligation N/A 11/03/2014    Procedure: POST PARTUM TUBAL LIGATION;  Surgeon: Adam PhenixJames G Arnold, MD;  Location: [facility];  Service: Gynecology;  Laterality: N/A;   Family History  Problem Relation Age of Onset  . Diabetes Mother   . Hearing loss Neg Hx    Social History  Substance Use Topics  . Smoking status: Current Some Day Smoker -- 0.25 packs/day for 4 years    Types: Cigarettes  . Smokeless tobacco: Never Used     Comment: off and on  . Alcohol Use: 0.6 oz/week    1 Shots of liquor per week   OB History    Gravida Para Term Preterm AB TAB SAB Ectopic Multiple Living   10 6 5 1 4  4   0 6     Review of Systems Red swollen right buttock Allergies  Review of patient's allergies indicates no known allergies.  Home Medications   Prior to Admission medications   Medication Sig Start Date End Date Taking? Authorizing Provider  acetaminophen (TYLENOL) 325 MG tablet Take 650 mg by mouth every 6  (six) hours as needed for mild pain.    Historical Provider, MD  albuterol (PROVENTIL HFA;VENTOLIN HFA) 108 (90 BASE) MCG/ACT inhaler Inhale 2 puffs into the lungs every 6 (six) hours as needed for wheezing.    Historical Provider, MD  cetirizine (ZYRTEC) 10 MG tablet Take 10 mg by mouth daily as needed for allergies.    Historical Provider, MD  diphenhydrAMINE (BENADRYL) 50 MG capsule Take 50 mg by mouth every 6 (six) hours as needed for allergies. Reported on 10/25/2015    Historical Provider, MD  HYDROcodone-acetaminophen (NORCO/VICODIN) 5-325 MG tablet Take 2 tablets by mouth every 4 (four) hours as needed. Patient not taking: Reported on 10/25/2015 10/09/15   Barrett HenleNicole Elizabeth Nadeau, PA-C  levonorgestrel-ethinyl estradiol (SEASONALE,INTROVALE,JOLESSA) 0.15-0.03 MG tablet Take 1 tablet by mouth daily. 10/25/15   Tereso NewcomerUgonna A Anyanwu, MD  sulfamethoxazole-trimethoprim (BACTRIM DS,SEPTRA DS) 800-160 MG tablet Take 1 tablet by mouth 2 (two) times daily. 11/06/15 11/13/15  Tharon AquasFrank C Patrick, PA  valACYclovir (VALTREX) 500 MG tablet TAKE 1 TABLET (500 MG TOTAL) BY MOUTH 2 (TWO) TIMES DAILY. 08/20/15   Tereso NewcomerUgonna A Anyanwu, MD   Meds Ordered and Administered this Visit  Medications - No data to display  BP 122/81 mmHg  Pulse 78  Temp(Src) 98.4 F (36.9 C) (Oral)  Resp 16  SpO2 100%  LMP 10/24/2015 (Exact Date) No data found.   Physical Exam  Constitutional: She  appears well-developed and well-nourished.  HENT:  Head: Normocephalic and atraumatic.  Pulmonary/Chest: Effort normal.  Musculoskeletal: Normal range of motion.       Legs: Nursing note and vitals reviewed.   ED Course  Procedures (including critical care time)  Labs Review Labs Reviewed - No data to display  Imaging Review No results found.   Visual Acuity Review  Right Eye Distance:   Left Eye Distance:   Bilateral Distance:    Right Eye Near:   Left Eye Near:    Bilateral Near:        RX for Bactrim MDM   1. Abscess     Patient is reassured that there are no issues that require transfer to higher level of care at this time.  Patient is advised to continue home symptomatic treatment. Prescription is sent to  pharmacy patient has indicated.  Patient is advised that if there are new or worsening symptoms or attend the emergency department, or contact primary care provider. Instructions of care provided discharged home in stable condition. Return to work/school note provided.  THIS NOTE WAS GENERATED USING A VOICE RECOGNITION SOFTWARE PROGRAM. ALL REASONABLE EFFORTS  WERE MADE TO PROOFREAD THIS DOCUMENT FOR ACCURACY.     Tharon Aquas, PA 11/06/15 2035

## 2015-12-26 ENCOUNTER — Ambulatory Visit (INDEPENDENT_AMBULATORY_CARE_PROVIDER_SITE_OTHER): Payer: Medicaid Other | Admitting: Obstetrics & Gynecology

## 2015-12-26 ENCOUNTER — Encounter: Payer: Self-pay | Admitting: Obstetrics & Gynecology

## 2015-12-26 VITALS — BP 121/74 | HR 73 | Wt 165.5 lb

## 2015-12-26 DIAGNOSIS — Z3040 Encounter for surveillance of contraceptives, unspecified: Secondary | ICD-10-CM

## 2015-12-26 NOTE — Patient Instructions (Signed)
Return to clinic for any scheduled appointments or for any gynecologic concerns as needed.   

## 2015-12-26 NOTE — Progress Notes (Signed)
   CLINIC ENCOUNTER NOTE  History:  35 y.o. [id number] here today for OCP check. Started on Seasonale 2 months ago. Denies any side effects. She denies any abnormal vaginal discharge, bleeding, pelvic pain or other concerns.   Past Medical History  Diagnosis Date  . History of miscarriage   . Asthma   . Headache(784.0)   . Vaginal Pap smear, abnormal     cervical cancer  . Infection     UTI  . HSV infection   . S/P tubal ligation 11/04/2014    Past Surgical History  Procedure Laterality Date  . Dilatation & curettage/hysteroscopy with trueclear  2003, 2006, 2006  . Gynecologic cryosurgery  2007  . Dilation and curettage of uterus    . Tubal ligation N/A 11/03/2014    Procedure: POST PARTUM TUBAL LIGATION;  Surgeon: Adam PhenixJames G Arnold, MD;  Location: [facility];  Service: Gynecology;  Laterality: N/A;    The following portions of the patient's history were reviewed and updated as appropriate: allergies, current medications, past family history, past medical history, past social history, past surgical history and problem list.   Health Maintenance:  Normal pap and negative HRHPV on 04/15/2014.  Review of Systems:  Pertinent items noted in HPI and remainder of comprehensive ROS otherwise negative.  Objective:  Physical Exam BP 121/74 mmHg  Pulse 73  Wt 165 lb 8 oz (75.07 kg) CONSTITUTIONAL: Well-developed, well-nourished female in no acute distress.  HENT:  Normocephalic, atraumatic. External right and left ear normal. Oropharynx is clear and moist EYES: Conjunctivae and EOM are normal. Pupils are equal, round, and reactive to light. No scleral icterus.  NECK: Normal range of motion, supple, no masses SKIN: Skin is warm and dry. No rash noted. Not diaphoretic. No erythema. No pallor. NEUROLOGIC: Alert and oriented to person, place, and time. Normal reflexes, muscle tone coordination. No cranial nerve deficit noted. PSYCHIATRIC: Normal mood and affect. Normal behavior. Normal judgment and  thought content. CARDIOVASCULAR: Normal heart rate noted RESPIRATORY: Effort and breath sounds normal, no problems with respiration noted ABDOMEN: Soft, no distention noted.   PELVIC: Deferred MUSCULOSKELETAL: Normal range of motion. No edema noted.   Assessment & Plan:  Normal OCP check and BP. Will continue for now. Patient told to come back for any concerns. Routine preventative health maintenance measures emphasized. Please refer to After Visit Summary for other counseling recommendations.     Jaynie CollinsUGONNA  Khiara Shuping, MD, FACOG Attending Obstetrician & Gynecologist, [facility]

## 2016-02-04 ENCOUNTER — Inpatient Hospital Stay (HOSPITAL_COMMUNITY)
Admission: AD | Admit: 2016-02-04 | Discharge: 2016-02-04 | Disposition: A | Discharge status: Home / Self Care | Payer: Medicaid Other | Source: Ambulatory Visit | Attending: Obstetrics & Gynecology | Admitting: Obstetrics & Gynecology

## 2016-02-04 ENCOUNTER — Encounter (HOSPITAL_COMMUNITY): Payer: Self-pay

## 2016-02-04 ENCOUNTER — Inpatient Hospital Stay (HOSPITAL_COMMUNITY): Payer: Medicaid Other

## 2016-02-04 DIAGNOSIS — R1030 Lower abdominal pain, unspecified: Secondary | ICD-10-CM | POA: Diagnosis present

## 2016-02-04 DIAGNOSIS — F17210 Nicotine dependence, cigarettes, uncomplicated: Secondary | ICD-10-CM | POA: Insufficient documentation

## 2016-02-04 DIAGNOSIS — R1031 Right lower quadrant pain: Secondary | ICD-10-CM

## 2016-02-04 DIAGNOSIS — Z8742 Personal history of other diseases of the female genital tract: Secondary | ICD-10-CM

## 2016-02-04 DIAGNOSIS — N83202 Unspecified ovarian cyst, left side: Secondary | ICD-10-CM | POA: Insufficient documentation

## 2016-02-04 DIAGNOSIS — Z8541 Personal history of malignant neoplasm of cervix uteri: Secondary | ICD-10-CM | POA: Insufficient documentation

## 2016-02-04 DIAGNOSIS — Z3202 Encounter for pregnancy test, result negative: Secondary | ICD-10-CM | POA: Diagnosis not present

## 2016-02-04 DIAGNOSIS — Z9851 Tubal ligation status: Secondary | ICD-10-CM | POA: Insufficient documentation

## 2016-02-04 LAB — URINALYSIS, ROUTINE W REFLEX MICROSCOPIC
Bilirubin Urine: NEGATIVE
Glucose, UA: NEGATIVE mg/dL
Ketones, ur: NEGATIVE mg/dL
LEUKOCYTES UA: NEGATIVE
Nitrite: NEGATIVE
Protein, ur: NEGATIVE mg/dL
SPECIFIC GRAVITY, URINE: 1.02 (ref 1.005–1.030)
pH: 7 (ref 5.0–8.0)

## 2016-02-04 LAB — CBC WITH DIFFERENTIAL/PLATELET
BASOS PCT: 1 %
Basophils Absolute: 0 10*3/uL (ref 0.0–0.1)
EOS ABS: 0.1 10*3/uL (ref 0.0–0.7)
EOS PCT: 3 %
HCT: 37.4 % (ref 36.0–46.0)
Hemoglobin: 12.7 g/dL (ref 12.0–15.0)
Lymphocytes Relative: 46 %
Lymphs Abs: 2.6 10*3/uL (ref 0.7–4.0)
MCH: 31.1 pg (ref 26.0–34.0)
MCHC: 34 g/dL (ref 30.0–36.0)
MCV: 91.4 fL (ref 78.0–100.0)
MONO ABS: 0.4 10*3/uL (ref 0.1–1.0)
MONOS PCT: 7 %
Neutro Abs: 2.6 10*3/uL (ref 1.7–7.7)
Neutrophils Relative %: 45 %
PLATELETS: 261 10*3/uL (ref 150–400)
RBC: 4.09 MIL/uL (ref 3.87–5.11)
RDW: 13.3 % (ref 11.5–15.5)
WBC: 5.7 10*3/uL (ref 4.0–10.5)

## 2016-02-04 LAB — WET PREP, GENITAL
Clue Cells Wet Prep HPF POC: NONE SEEN
Sperm: NONE SEEN
TRICH WET PREP: NONE SEEN
YEAST WET PREP: NONE SEEN

## 2016-02-04 LAB — URINE MICROSCOPIC-ADD ON
BACTERIA UA: NONE SEEN
WBC UA: NONE SEEN WBC/hpf (ref 0–5)

## 2016-02-04 LAB — POCT PREGNANCY, URINE: PREG TEST UR: NEGATIVE

## 2016-02-04 MED ORDER — KETOROLAC TROMETHAMINE 60 MG/2ML IM SOLN
60.0000 mg | Freq: Once | INTRAMUSCULAR | Status: AC
  Administered 2016-02-04: 60 mg via INTRAMUSCULAR
  Filled 2016-02-04: qty 2

## 2016-02-04 NOTE — Discharge Instructions (Signed)
Ovarian Cyst An ovarian cyst is a sac filled with fluid or blood. This sac is attached to the ovary. Some cysts go away on their own. Other cysts need treatment.  HOME CARE   Only take medicine as told by your doctor.  Follow up with your doctor as told.  Get regular pelvic exams and Pap tests. GET HELP IF:  Your periods are late, not regular, or painful.  You stop having periods.  Your belly (abdominal) or pelvic pain does not go away.  Your belly becomes large or puffy (swollen).  You have a hard time peeing (totally emptying your bladder).  You have pressure on your bladder.  You have pain during sex.  You feel fullness, pressure, or discomfort in your belly.  You lose weight for no reason.  You feel sick most of the time.  You have a hard time pooping (constipation).  You do not feel like eating.  You develop pimples (acne).  You have an increase in hair on your body and face.  You are gaining weight for no reason.  You think you are pregnant. GET HELP RIGHT AWAY IF:   Your belly pain gets worse.  You feel sick to your stomach (nauseous), and you throw up (vomit).  You have a fever that comes on fast.  You have belly pain while pooping (bowel movement).  Your periods are heavier than usual. MAKE SURE YOU:   Understand these instructions.  Will watch your condition.  Will get help right away if you are not doing well or get worse.   This information is not intended to replace advice given to you by your health care provider. Make sure you discuss any questions you have with your health care provider.   Document Released: 02/04/2008 Document Revised: 06/08/2013 Document Reviewed: 04/25/2013 Elsevier Interactive Patient Education 2016 Elsevier Inc.  Do NOT TAKE ADDITIONAL IBUPROFEN/ALEVE until mid day tomorrow.   May add tylenol if needed

## 2016-02-04 NOTE — MAU Provider Note (Signed)
History     CSN: [id number]  Arrival date and time: 02/04/16 1514   None     No chief complaint on file.  HPI Shelly Silva is 35 y.o. [id number] Presenting with lower abdominal pain that began this am at work.  She is a patient of Dr. Mont Dutton. Pain began in the midline and was shooting into the abdomen.  Pain is in the right lower abdomen.  Rated as a 10/10 this am and now 8/10. Took 2 Aleve tabs at 11am and then tried to sleep it off, slept X 1 hr, med did not help. Sweating when she woke up.  Had [place] in Feb that showed left ovarian cysts.  Called clinic today, said lines were tied up and came in. Good appetite, neg for Nausea, vomiting or known fever.  Normal BMs-last one yesterday.  1 partner X 14 years. Hx of BTL last year. Is beginning the 5th week of Seasonal  given to treat ovarian cysts/pain.     Past Medical History  Diagnosis Date  . History of miscarriage   . Asthma   . Headache(784.0)   . Vaginal Pap smear, abnormal     cervical cancer  . Infection     UTI  . HSV infection   . S/P tubal ligation 11/04/2014    Past Surgical History  Procedure Laterality Date  . Dilatation & curettage/hysteroscopy with trueclear  2003, 2006, 2006  . Gynecologic cryosurgery  2007  . Dilation and curettage of uterus    . Tubal ligation N/A 11/03/2014    Procedure: POST PARTUM TUBAL LIGATION;  Surgeon: Adam Phenix, MD;  Location: [facility];  Service: Gynecology;  Laterality: N/A;    Family History  Problem Relation Age of Onset  . Diabetes Mother   . Hearing loss Neg Hx     Social History  Substance Use Topics  . Smoking status: Current Some Day Smoker -- 0.25 packs/day for 4 years    Types: Cigarettes  . Smokeless tobacco: Never Used     Comment: off and on  . Alcohol Use: 0.6 oz/week    1 Shots of liquor per week     Comment: occ    Allergies: No Known Allergies  Prescriptions prior to admission  Medication Sig Dispense Refill Last Dose  . acetaminophen (TYLENOL) 325  MG tablet Take 650 mg by mouth every 6 (six) hours as needed for mild pain. Reported on 12/26/2015   Not Taking  . albuterol (PROVENTIL HFA;VENTOLIN HFA) 108 (90 BASE) MCG/ACT inhaler Inhale 2 puffs into the lungs every 6 (six) hours as needed for wheezing. Reported on 12/26/2015   Not Taking  . cetirizine (ZYRTEC) 10 MG tablet Take 10 mg by mouth daily as needed for allergies. Reported on 12/26/2015   Not Taking  . diphenhydrAMINE (BENADRYL) 50 MG capsule Take 50 mg by mouth every 6 (six) hours as needed for allergies. Reported on 10/25/2015   Taking  . HYDROcodone-acetaminophen (NORCO/VICODIN) 5-325 MG tablet Take 2 tablets by mouth every 4 (four) hours as needed. 6 tablet 0 Taking  . levonorgestrel-ethinyl estradiol (SEASONALE,INTROVALE,JOLESSA) 0.15-0.03 MG tablet Take 1 tablet by mouth daily. 1 Package 4 Taking  . valACYclovir (VALTREX) 500 MG tablet TAKE 1 TABLET (500 MG TOTAL) BY MOUTH 2 (TWO) TIMES DAILY. 30 tablet 1 Taking    Review of Systems  Constitutional: Negative for fever and chills.       Sweating X 1 episode at lunch today.  Gastrointestinal: Positive for abdominal  pain. Negative for nausea, vomiting, diarrhea and constipation.  Genitourinary: Negative for dysuria, urgency and frequency.       Negative for vaginal bleeding or discharge.   Neurological: Negative for headaches.   Physical Exam   Blood pressure 123/73, pulse 77, temperature 98.4 F (36.9 C), temperature source Oral, resp. rate 18, weight 165 lb 9.6 oz (75.116 kg), not currently breastfeeding.  Physical Exam  Nursing note and vitals reviewed. Constitutional: She is oriented to person, place, and time. She appears well-developed and well-nourished. No distress.  HENT:  Head: Normocephalic.  Neck: Normal range of motion.  Cardiovascular: Normal rate.   Respiratory: Effort normal.  GI: There is tenderness (lower right pain without rebound and guarding).  Genitourinary: There is no rash, tenderness or lesion on  the right labia. There is no rash, tenderness or lesion on the left labia. Uterus is not enlarged and not tender. Cervix exhibits no motion tenderness, no discharge and no friability. Right adnexum displays tenderness (moderate tenderness on exam.). Right adnexum displays no mass and no fullness. Left adnexum displays no mass and no tenderness. No erythema, tenderness or bleeding in the vagina. No vaginal discharge found.  Neurological: She is alert and oriented to person, place, and time.  Skin: Skin is warm and dry.  Psychiatric: She has a normal mood and affect. Her behavior is normal. Thought content normal.   Results for orders placed or performed during the hospital encounter of 02/04/16 (from the past 24 hour(s))  Urinalysis, Routine w reflex microscopic (not at [facility])     Status: Abnormal   Collection Time: 02/04/16  3:55 PM  Result Value Ref Range   Color, Urine YELLOW YELLOW   APPearance CLEAR CLEAR   Specific Gravity, Urine 1.020 1.005 - 1.030   pH 7.0 5.0 - 8.0   Glucose, UA NEGATIVE NEGATIVE mg/dL   Hgb urine dipstick SMALL (A) NEGATIVE   Bilirubin Urine NEGATIVE NEGATIVE   Ketones, ur NEGATIVE NEGATIVE mg/dL   Protein, ur NEGATIVE NEGATIVE mg/dL   Nitrite NEGATIVE NEGATIVE   Leukocytes, UA NEGATIVE NEGATIVE  Urine microscopic-add on     Status: Abnormal   Collection Time: 02/04/16  3:55 PM  Result Value Ref Range   Squamous Epithelial / LPF 0-5 (A) NONE SEEN   WBC, UA NONE SEEN 0 - 5 WBC/hpf   RBC / HPF 0-5 0 - 5 RBC/hpf   Bacteria, UA NONE SEEN NONE SEEN  Pregnancy, urine POC     Status: None   Collection Time: 02/04/16  3:58 PM  Result Value Ref Range   Preg Test, Ur NEGATIVE NEGATIVE  Wet prep, genital     Status: Abnormal   Collection Time: 02/04/16  5:05 PM  Result Value Ref Range   Yeast Wet Prep HPF POC NONE SEEN NONE SEEN   Trich, Wet Prep NONE SEEN NONE SEEN   Clue Cells Wet Prep HPF POC NONE SEEN NONE SEEN   WBC, Wet Prep HPF POC MANY (A) NONE SEEN   Sperm  NONE SEEN   CBC with Differential/Platelet     Status: None   Collection Time: 02/04/16  5:16 PM  Result Value Ref Range   WBC 5.7 4.0 - 10.5 K/uL   RBC 4.09 3.87 - 5.11 MIL/uL   Hemoglobin 12.7 12.0 - 15.0 g/dL   HCT 16.1 09.6 - 04.5 %   MCV 91.4 78.0 - 100.0 fL   MCH 31.1 26.0 - 34.0 pg   MCHC 34.0 30.0 - 36.0 g/dL  RDW 13.3 11.5 - 15.5 %   Platelets 261 150 - 400 K/uL   Neutrophils Relative % 45 %   Neutro Abs 2.6 1.7 - 7.7 K/uL   Lymphocytes Relative 46 %   Lymphs Abs 2.6 0.7 - 4.0 K/uL   Monocytes Relative 7 %   Monocytes Absolute 0.4 0.1 - 1.0 K/uL   Eosinophils Relative 3 %   Eosinophils Absolute 0.1 0.0 - 0.7 K/uL   Basophils Relative 1 %   Basophils Absolute 0.0 0.0 - 0.1 K/uL   [place] Transvaginal Non-ob  02/04/2016  CLINICAL DATA:  35 year old female with history of ovarian cyst presenting with right lower quadrant abdominal pain. EXAM: ULTRASOUND PELVIS TRANSVAGINAL TECHNIQUE: Transvaginal ultrasound examination of the pelvis was performed including evaluation of the uterus, ovaries, adnexal regions, and pelvic cul-de-sac. COMPARISON:  Ultrasound dated 10/09/2015 FINDINGS: Uterus Measurements: 8.0 x 4.1 x 5.4 cm. No fibroids or other mass visualized. Endometrium Thickness: 7 mm.  No focal abnormality visualized. Right ovary Measurements: 2.7 x 1.5.2 cm. Normal appearance/no adnexal mass. Left ovary Measurements: 3.2 x 2.72.7 cm. There is a 1.8 x 1.6 x 1.9 cm cyst in the left ovary. Other findings:  Trace free fluid within the pelvis. IMPRESSION: Small left ovarian cyst otherwise unremarkable pelvic ultrasound. Electronically Signed   By: Elgie CollardArash  Radparvar M.D.   On: 02/04/2016 18:25   Procedures  GC/CHL culture and HIV pending  MDM MSE Lab Exam US Reviewed Lab and [facility]. Toradol 60mg  IM given in MAU--patient is feeling better after injection  Rates pain as 3/10.    Assessment and Plan  A: Right adnexal pain     History of ovarian cyst     Left  ovarian cyst per U/S finidings  P: Follow up with Dr. Macon LargeAnyanwu as planned.        Instructed not to take any additional NSAID until mid day tomorrow.        Continue OCPs as instructed.  KEY,EVE M 02/04/2016, 6:11 PM

## 2016-02-04 NOTE — MAU Note (Signed)
Tried to go to work today.  Has 2 cysts on her ovaries. Pain is worse than it was.  Was told to come if things/pain got worse

## 2016-02-05 LAB — HIV ANTIBODY (ROUTINE TESTING W REFLEX): HIV SCREEN 4TH GENERATION: NONREACTIVE

## 2016-02-06 LAB — GC/CHLAMYDIA PROBE AMP (~~LOC~~) NOT AT ARMC
Chlamydia: NEGATIVE
NEISSERIA GONORRHEA: NEGATIVE

## 2016-03-15 ENCOUNTER — Inpatient Hospital Stay (HOSPITAL_COMMUNITY)
Admission: AD | Admit: 2016-03-15 | Discharge: 2016-03-15 | Disposition: A | Discharge status: Home / Self Care | Payer: Medicaid Other | Source: Ambulatory Visit | Attending: Obstetrics & Gynecology | Admitting: Obstetrics & Gynecology

## 2016-03-15 ENCOUNTER — Encounter (HOSPITAL_COMMUNITY): Payer: Self-pay | Admitting: *Deleted

## 2016-03-15 DIAGNOSIS — Z793 Long term (current) use of hormonal contraceptives: Secondary | ICD-10-CM | POA: Insufficient documentation

## 2016-03-15 DIAGNOSIS — N83209 Unspecified ovarian cyst, unspecified side: Secondary | ICD-10-CM | POA: Insufficient documentation

## 2016-03-15 DIAGNOSIS — Z9851 Tubal ligation status: Secondary | ICD-10-CM | POA: Insufficient documentation

## 2016-03-15 DIAGNOSIS — N939 Abnormal uterine and vaginal bleeding, unspecified: Secondary | ICD-10-CM | POA: Insufficient documentation

## 2016-03-15 DIAGNOSIS — F17210 Nicotine dependence, cigarettes, uncomplicated: Secondary | ICD-10-CM | POA: Insufficient documentation

## 2016-03-15 DIAGNOSIS — J45909 Unspecified asthma, uncomplicated: Secondary | ICD-10-CM | POA: Insufficient documentation

## 2016-03-15 LAB — URINE MICROSCOPIC-ADD ON
Bacteria, UA: NONE SEEN
WBC, UA: NONE SEEN WBC/hpf (ref 0–5)

## 2016-03-15 LAB — URINALYSIS, ROUTINE W REFLEX MICROSCOPIC
BILIRUBIN URINE: NEGATIVE
Glucose, UA: NEGATIVE mg/dL
KETONES UR: NEGATIVE mg/dL
Leukocytes, UA: NEGATIVE
NITRITE: NEGATIVE
PH: 5.5 (ref 5.0–8.0)
Protein, ur: NEGATIVE mg/dL
Specific Gravity, Urine: 1.02 (ref 1.005–1.030)

## 2016-03-15 LAB — POCT PREGNANCY, URINE: Preg Test, Ur: NEGATIVE

## 2016-03-15 LAB — CBC
HCT: 39.5 % (ref 36.0–46.0)
Hemoglobin: 13.3 g/dL (ref 12.0–15.0)
MCH: 30.6 pg (ref 26.0–34.0)
MCHC: 33.7 g/dL (ref 30.0–36.0)
MCV: 91 fL (ref 78.0–100.0)
PLATELETS: 297 10*3/uL (ref 150–400)
RBC: 4.34 MIL/uL (ref 3.87–5.11)
RDW: 13.2 % (ref 11.5–15.5)
WBC: 7.3 10*3/uL (ref 4.0–10.5)

## 2016-03-15 MED ORDER — NORGESTIMATE-ETH ESTRADIOL 0.25-35 MG-MCG PO TABS: 1.0000 | ORAL_TABLET | Freq: Every day | ORAL | Status: DC

## 2016-03-15 NOTE — MAU Note (Signed)
Pt was told she had two cysts on her left ovary.  Repeat ultrasound showed only one cyst left.  Pt has been bleeding for the last 4 weeks intermittently.  Also has some lower left quadrant pain.

## 2016-03-15 NOTE — Discharge Instructions (Signed)
Begin tomorrow and take one daily. Follow up with a clinic appointment if you are not doing well - more cramping and constant vaginal bleeding has not resolved. Strongly advised her to stop all smoking - given info about 1-800-QUITNOW

## 2016-03-15 NOTE — MAU Provider Note (Signed)
History     CSN: [id number]  Arrival date and time: 03/15/16 1419   First Provider Initiated Contact with Patient 03/15/16 1457      Chief Complaint  Patient presents with  . Ovarian Cyst   HPI Shelly Silva 35 y.o.  Comes to MAU with frequent vaginal bleeding, almost daily, since the second week of June.  Is on Seasonale to help her not form ovarian cysts but she has been having lots of vaginal bleeding.  Is taking the pills daily.  Still has vaginal bleeding and sometimes she has cramping - take Tylenol during the day and Advil PM at night if she is having cramping. Client is a smoker.  Has had a BTL.  Client works during the week and has difficulty getting off to come to the clinic.  Denies any new sex partners.  Had DNA testing done last month - all tests were negative.  OB History    Gravida Para Term Preterm AB TAB SAB Ectopic Multiple Living   0 6      Past Medical History  Diagnosis Date  . History of miscarriage   . Asthma   . Headache(784.0)   . Vaginal Pap smear, abnormal     cervical cancer  . Infection     UTI  . HSV infection   . S/P tubal ligation 11/04/2014    Past Surgical History  Procedure Laterality Date  . Dilatation & curettage/hysteroscopy with trueclear  2003, 2006, 2006  . Gynecologic cryosurgery  2007  . Dilation and curettage of uterus    . Tubal ligation N/A 11/03/2014    Procedure: POST PARTUM TUBAL LIGATION;  Surgeon: Adam Phenix, MD;  Location: [facility];  Service: Gynecology;  Laterality: N/A;    Family History  Problem Relation Age of Onset  . Diabetes Mother   . Hearing loss Neg Hx     Social History  Substance Use Topics  . Smoking status: Current Some Day Smoker -- 0.25 packs/day for 4 years    Types: Cigarettes  . Smokeless tobacco: Never Used     Comment: off and on  . Alcohol Use: 0.6 oz/week    1 Shots of liquor per week     Comment: occ    Allergies: No Known Allergies  Prescriptions prior to  admission  Medication Sig Dispense Refill Last Dose  . acetaminophen (TYLENOL) 500 MG tablet Take 1,000 mg by mouth every 6 (six) hours as needed for mild pain or headache.   03/15/2016 at 1100  . albuterol (PROVENTIL HFA;VENTOLIN HFA) 108 (90 BASE) MCG/ACT inhaler Inhale 2 puffs into the lungs every 6 (six) hours as needed for wheezing or shortness of breath.    Past Month at Unknown time  . diphenhydrAMINE (BENADRYL) 25 MG tablet Take 25 mg by mouth daily as needed for allergies.   03/15/2016 at Unknown time  . levonorgestrel-ethinyl estradiol (SEASONALE,INTROVALE,JOLESSA) 0.15-0.03 MG tablet Take 1 tablet by mouth daily. 1 Package 4 03/15/2016 at Unknown time    Review of Systems  Constitutional: Negative for fever.  Gastrointestinal: Negative for nausea, vomiting and abdominal pain.  Genitourinary:       No vaginal discharge. Vaginal bleeding. No dysuria.   Physical Exam   Blood pressure 137/81, pulse 96, temperature 98.2 F (36.8 C), temperature source Oral, resp. rate 16, not currently breastfeeding.  Physical Exam  Nursing note and vitals reviewed. Constitutional: She is oriented to person, place, and  time. She appears well-developed and well-nourished.  HENT:  Head: Normocephalic.  Eyes: EOM are normal.  Neck: Neck supple.  GI: Soft. There is no tenderness.  Genitourinary:  Speculum exam: Vagina - Small amount of dark blood, no odor Cervix - No active bleeding Bimanual exam: Cervix closed Uterus mildly tender, 8 week size Adnexa non tender, no masses bilaterally GC/Chlam, wet prep done Chaperone present for exam.  Musculoskeletal: Normal range of motion.  Neurological: She is alert and oriented to person, place, and time.  Skin: Skin is warm and dry.  Psychiatric: She has a normal mood and affect.    MAU Course  Procedures Results for orders placed or performed during the hospital encounter of 03/15/16 (from the past 24 hour(s))  Urinalysis, Routine w reflex  microscopic (not [facility])     Status: Abnormal   Collection Time: 03/15/16  2:32 PM  Result Value Ref Range   Color, Urine YELLOW YELLOW   APPearance CLEAR CLEAR   Specific Gravity, Urine 1.020 1.005 - 1.030   pH 5.5 5.0 - 8.0   Glucose, UA NEGATIVE NEGATIVE mg/dL   Hgb urine dipstick LARGE (A) NEGATIVE   Bilirubin Urine NEGATIVE NEGATIVE   Ketones, ur NEGATIVE NEGATIVE mg/dL   Protein, ur NEGATIVE NEGATIVE mg/dL   Nitrite NEGATIVE NEGATIVE   Leukocytes, UA NEGATIVE NEGATIVE  Urine microscopic-add on     Status: Abnormal   Collection Time: 03/15/16  2:32 PM  Result Value Ref Range   Squamous Epithelial / LPF 0-5 (A) NONE SEEN   WBC, UA NONE SEEN 0 - 5 WBC/hpf   RBC / HPF 0-5 0 - 5 RBC/hpf   Bacteria, UA NONE SEEN NONE SEEN  Pregnancy, urine POC     Status: None   Collection Time: 03/15/16  2:46 PM  Result Value Ref Range   Preg Test, Ur NEGATIVE NEGATIVE  CBC     Status: None   Collection Time: 03/15/16  3:43 PM  Result Value Ref Range   WBC 7.3 4.0 - 10.5 K/uL   RBC 4.34 3.87 - 5.11 MIL/uL   Hemoglobin 13.3 12.0 - 15.0 g/dL   HCT 53.639.5 64.436.0 - 03.446.0 %   MCV 91.0 78.0 - 100.0 fL   MCH 30.6 26.0 - 34.0 pg   MCHC 33.7 30.0 - 36.0 g/dL   RDW 74.213.2 59.511.5 - 63.815.5 %   Platelets 297 150 - 400 K/uL    MDM May be having vaginal bleeding due to being on Seasonale continuously.  Will try a monophasic pills that has a withdrawal bleed to help control the irregular bleeding.  Last ultrasound was 02-04-16 and showed one ovarian cyst 1.8X1.6X1.9 cm - not enough time elapsed to repeat the ultrasound.  Assessment and Plan  Abnormal vaginal bleeding No anemia Hx of ovarian cyst  Plan Will eprescribe monophasic pill for client to use.  Begin tomorrow and take one daily. Follow up with a clinic appointment if you are not doing well - more cramping and constant vaginal bleeding has not resolved. Strongly advised her to stop all smoking - given info about  1-800-QUITNOW  BURLESON,TERRI 03/15/2016, 4:24 PM

## 2016-05-02 ENCOUNTER — Ambulatory Visit (INDEPENDENT_AMBULATORY_CARE_PROVIDER_SITE_OTHER): Payer: Self-pay | Admitting: Obstetrics & Gynecology

## 2016-05-02 ENCOUNTER — Encounter: Payer: Self-pay | Admitting: Obstetrics & Gynecology

## 2016-05-02 VITALS — BP 109/69 | HR 70 | Temp 98.5°F | Resp 17 | Ht 63.0 in | Wt 167.9 lb

## 2016-05-02 DIAGNOSIS — Z1151 Encounter for screening for human papillomavirus (HPV): Secondary | ICD-10-CM

## 2016-05-02 DIAGNOSIS — N939 Abnormal uterine and vaginal bleeding, unspecified: Secondary | ICD-10-CM

## 2016-05-02 DIAGNOSIS — Z87410 Personal history of cervical dysplasia: Secondary | ICD-10-CM

## 2016-05-02 DIAGNOSIS — Z124 Encounter for screening for malignant neoplasm of cervix: Secondary | ICD-10-CM

## 2016-05-02 DIAGNOSIS — R87810 Cervical high risk human papillomavirus (HPV) DNA test positive: Secondary | ICD-10-CM

## 2016-05-02 DIAGNOSIS — R87610 Atypical squamous cells of undetermined significance on cytologic smear of cervix (ASC-US): Secondary | ICD-10-CM | POA: Insufficient documentation

## 2016-05-02 DIAGNOSIS — Z113 Encounter for screening for infections with a predominantly sexual mode of transmission: Secondary | ICD-10-CM

## 2016-05-02 LAB — CBC
HCT: 41.3 % (ref 35.0–45.0)
Hemoglobin: 13.6 g/dL (ref 11.7–15.5)
MCH: 30.7 pg (ref 27.0–33.0)
MCHC: 32.9 g/dL (ref 32.0–36.0)
MCV: 93.2 fL (ref 80.0–100.0)
MPV: 10.7 fL (ref 7.5–12.5)
PLATELETS: 295 10*3/uL (ref 140–400)
RBC: 4.43 MIL/uL (ref 3.80–5.10)
RDW: 12.9 % (ref 11.0–15.0)
WBC: 6.2 10*3/uL (ref 3.8–10.8)

## 2016-05-02 MED ORDER — MEGESTROL ACETATE 20 MG PO TABS: ORAL_TABLET | ORAL | 2 refills | Status: DC

## 2016-05-02 MED ORDER — IBUPROFEN 800 MG PO TABS: 800.0000 mg | ORAL_TABLET | Freq: Three times a day (TID) | ORAL | 3 refills | Status: DC | PRN

## 2016-05-02 NOTE — Progress Notes (Signed)
Pt reports heavy bleeding since the end of June. Pt states the last two weeks she has been bleeding very heavy "darkish red" and it eased up to a light pink this morning "which does not last long" pt states "sometimes she sees blood that is "clay-like and real dark" Pt states yesterday that she "saw spots" and has been feeling "extra tired" and experiencing headaches. Pt states she felt like she almost "blacked out" yesterday.

## 2016-05-02 NOTE — Progress Notes (Signed)
    OFFICE ENCOUNTER NOTE  Subjective:  HPI:  Patient is a 35 year old female G10P6 with a past medical history of BTL and HSV that presents with abnormal uterine bleeding.   Patient reports abnormal bleeding that started in mid June. She has had phases of heavy and lighter bleeding, but it has been consistent since then. As of now she is having light bleeding and has changed her pad once today. Patient also reports having lower abdominal pain. It is midline and she rates the pain a 6/10. Advil PM helps but she thinks this is mostly because it helps her to fall asleep. Physical exertion and lying on her stomach exacerbates the pain. Patient also reports some fatigue and headache that she thinks started around the same time as the bleeding. She has never had anything like this before. Patient also reports pain with intercourse but this is not new and she does not think this affects her bleeding.  She has been in the MAU for this complaint as well, her work up included an U/S which showed a left ovarian cyst. She was switched from Seasonale to Sprintec to try a monophasic medication.  Medical history: Asthma HSV  Surgical history: BTL D&C   Family history: Patient reports her paternal aunt was diagnosed with breast cancer at age 33. Mother has diabetes and HTN.  Social history: Patient recently quit smoking. She consumes alcohol only socially which she reports is rare. [place]  Review of systems  Constitutional: Headache, some fatigue, no loss of consciousness. HEENT: no changes in vision Respiratory: no shortness of breath or cough Cardiovascular: no chest pain or palpitations. Abdominal: tenderness and pain in lower abdomen. Worse with activity. GU: bleeding outside of her period. Constant since end of June. Pain after intercourse. MSK: no weakness.  Objective: Vitals:   05/02/16 0807  BP: 109/69  Pulse: 70  Resp: 17  Temp: 98.5 F (36.9 C)    Physical exam: General: patient is in no acute distress, sitting comfortably on exam table.  Cardiovascular: normal heart rate Abdominal: slight tenderness on lower abdomen, no rebound tenderness. No masses or organomegaly. GU: tenderness on entire bimanual exam. Blood in the vagina, no discharge or odor. Pap done. Normal uterine size. Neuro: patient is alert and oriented X3  Assessment:  Patient's irregular bleeding was not resolved with her recent oral contraceptive medications (seasonale and Sprintec) and she will consider alternative options including medications, IUD, and surgery for better control of her symptoms.  Plan: - CBC to assess for anemia given her abnormal uterine bleeding - measure TSH as possible contributor for her bleeding. - pap smear performed  - discussed with patient options including IUD, medications, and surgery. She will apply for charity care while starting Megace for now. Patient states she would likely the IUD, and eventually perhaps surgery. Application filled for IUD  Hal MoralesSharon Eshet, Medical Student  Attestation of Attending Supervision of Medical Student: Evaluation and management procedures were performed by the Medical Student under my supervision.  I have seen and examined the patient, reviewed the the student's note and chart, and I agree with management and plan. I also edited the note above and make significant changes to the note above.    Jaynie CollinsUGONNA  Sundeep Cary, MD, FACOG Attending Obstetrician & Gynecologist Faculty Practice, [facility]

## 2016-05-02 NOTE — Patient Instructions (Signed)

## 2016-05-03 LAB — TSH: TSH: 1.5 m[IU]/L

## 2016-05-06 LAB — CYTOLOGY - PAP

## 2016-05-09 ENCOUNTER — Encounter: Payer: Self-pay | Admitting: Obstetrics & Gynecology

## 2016-06-19 ENCOUNTER — Encounter: Payer: Self-pay | Admitting: Obstetrics and Gynecology

## 2016-06-19 ENCOUNTER — Telehealth: Payer: Self-pay | Admitting: General Practice

## 2016-06-19 NOTE — Telephone Encounter (Signed)
Patient no showed for colpo appt. Called patient and she states she had no idea of the appt. Told patient we will get her appt rescheduled and someone will contact her. Patient verbalized understanding and had no questions

## 2016-06-29 IMAGING — US US TRANSVAGINAL NON-OB
1 series · 15 of 25 positions shown · non-contrast
Comparison: Ultrasound dated 10/09/2015

CLINICAL DATA: 35-year-old female with history of ovarian cyst
presenting with right lower quadrant abdominal pain.

EXAM:
ULTRASOUND PELVIS TRANSVAGINAL
TECHNIQUE: Transvaginal ultrasound examination of the pelvis was performed
including evaluation of the uterus, ovaries, adnexal regions, and
pelvic cul-de-sac.

[Series 1: us transvaginal non-ob · 64 acquisitions, 15 frames shown]
[im 1/64]
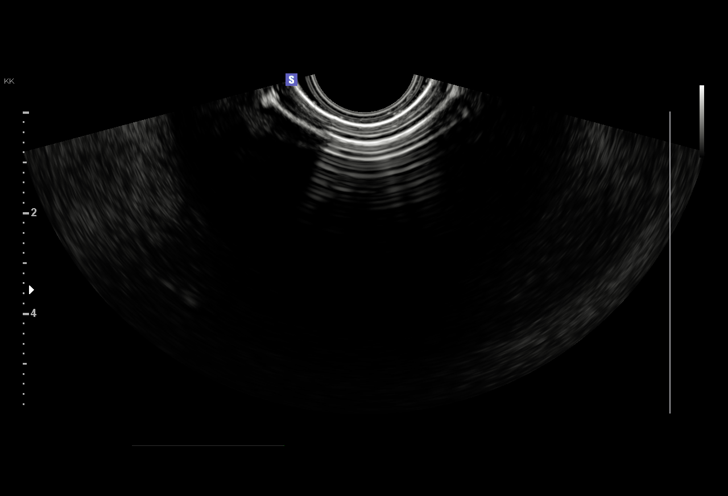
[im 6/64]
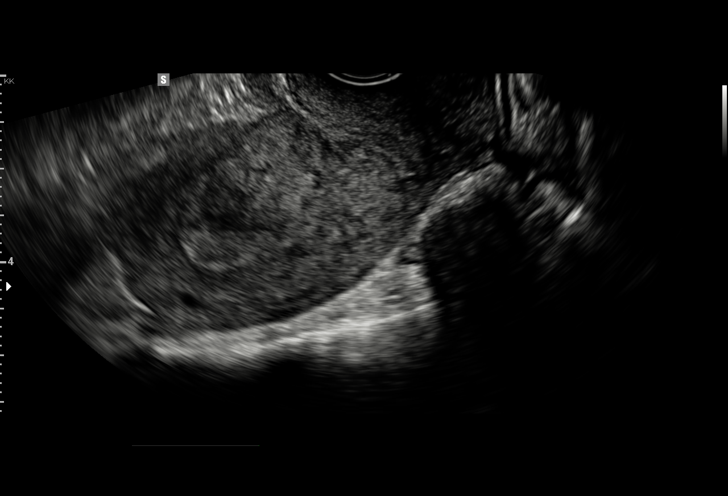
[im 11/64]
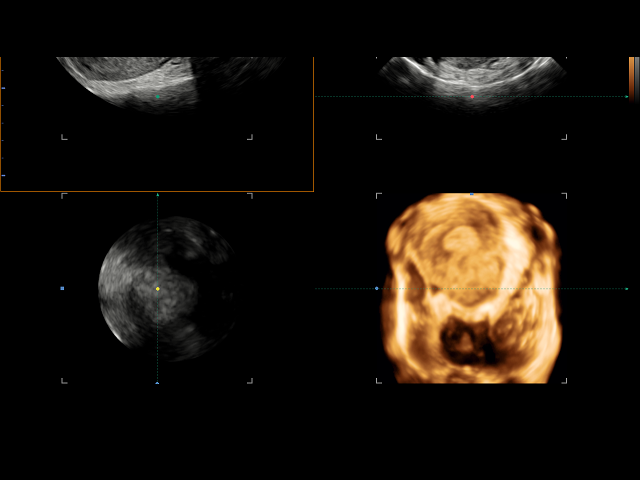
[im 14/64]
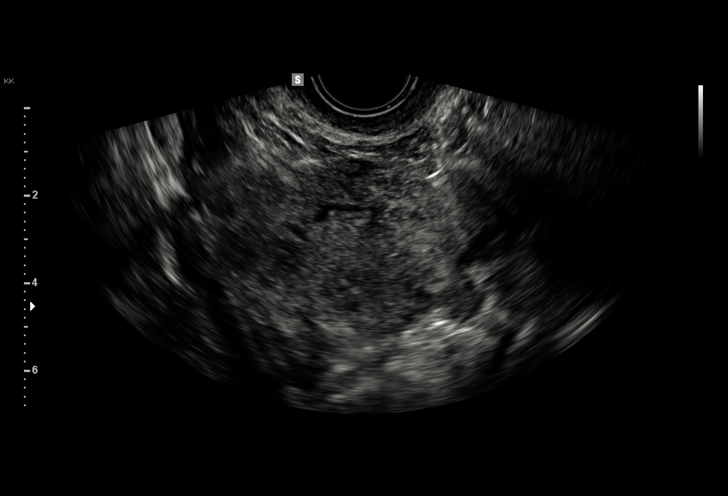
[im 19/64]
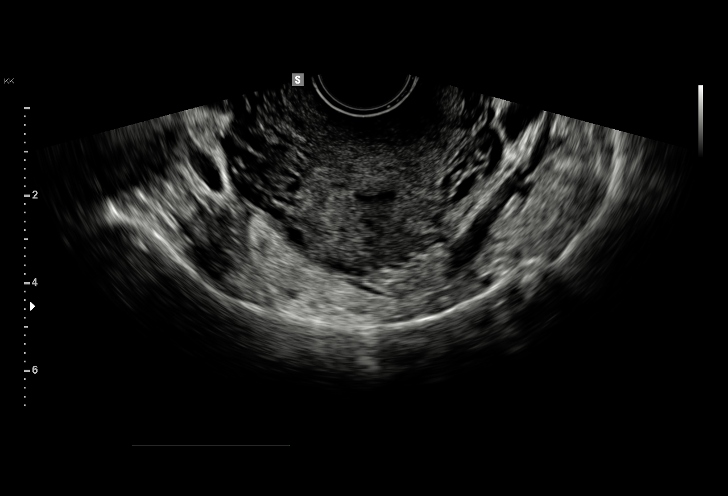
[im 24/64]
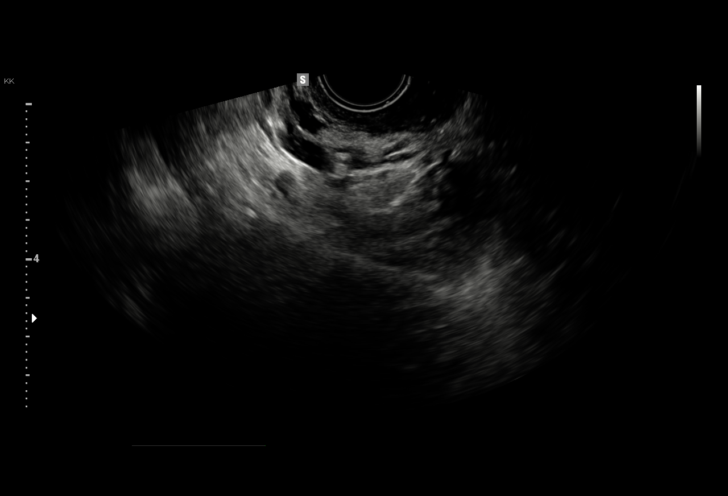
[im 27/64]
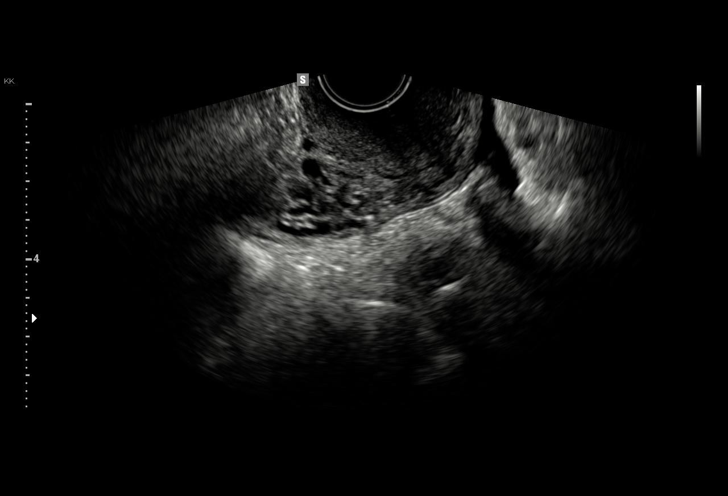
[im 32/64]
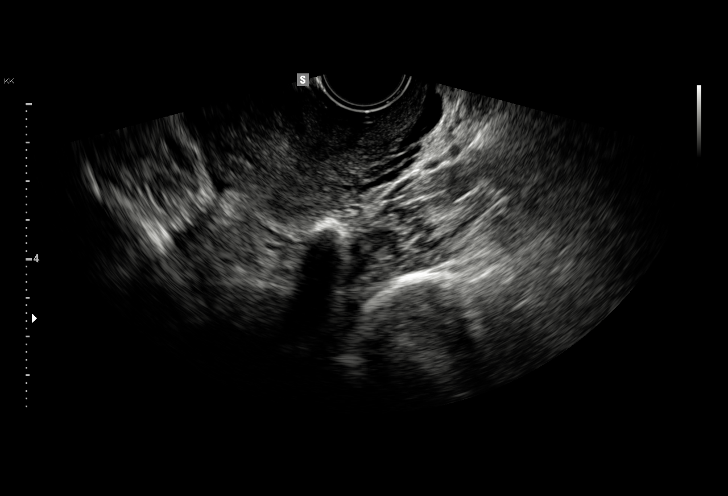
[im 37/64]
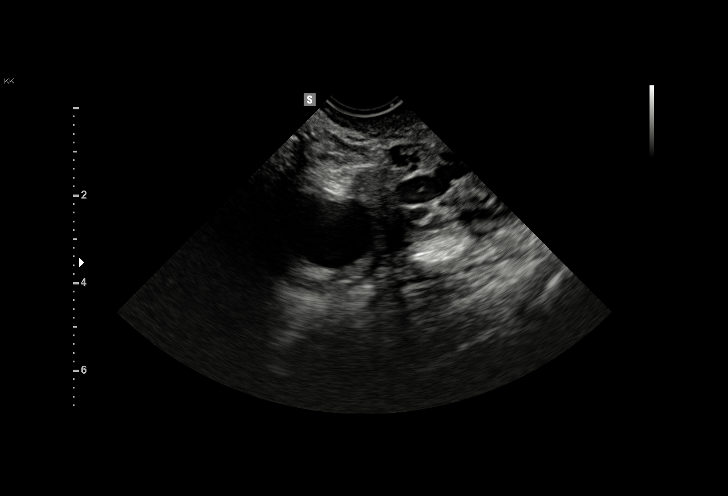
[im 40/64]
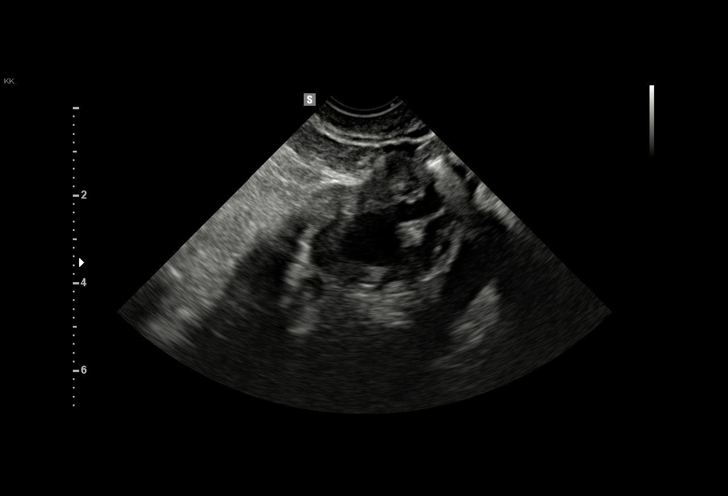
[im 45/64]
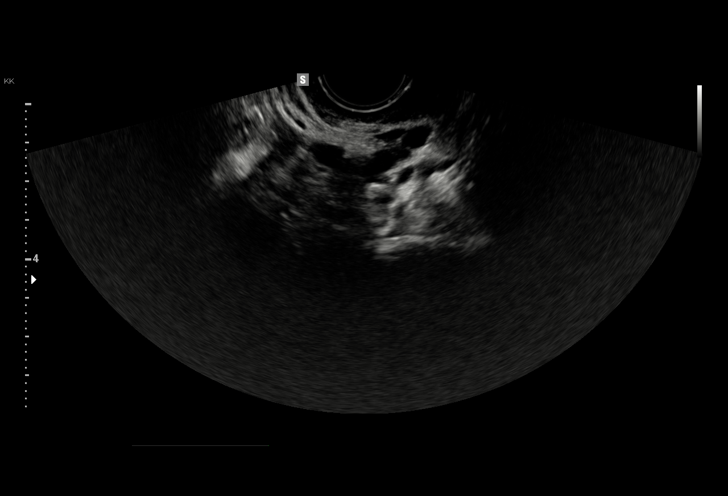
[im 50/64]
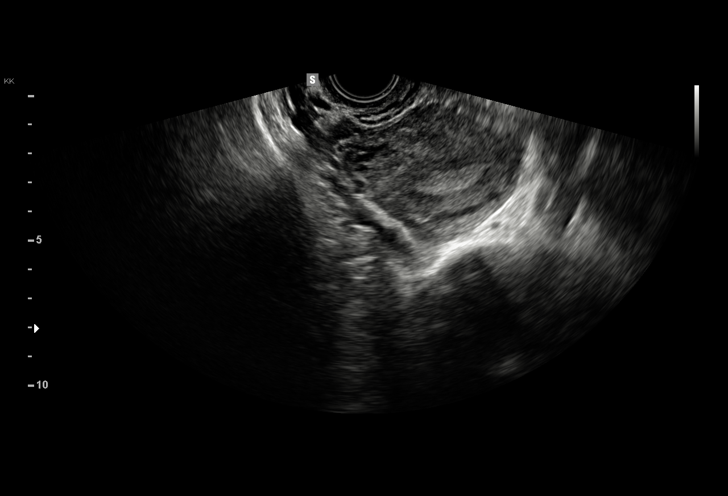
[im 53/64]
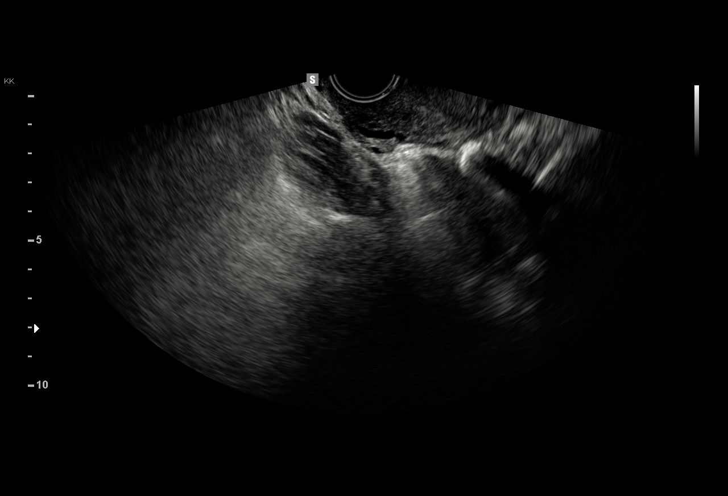
[im 58/64]
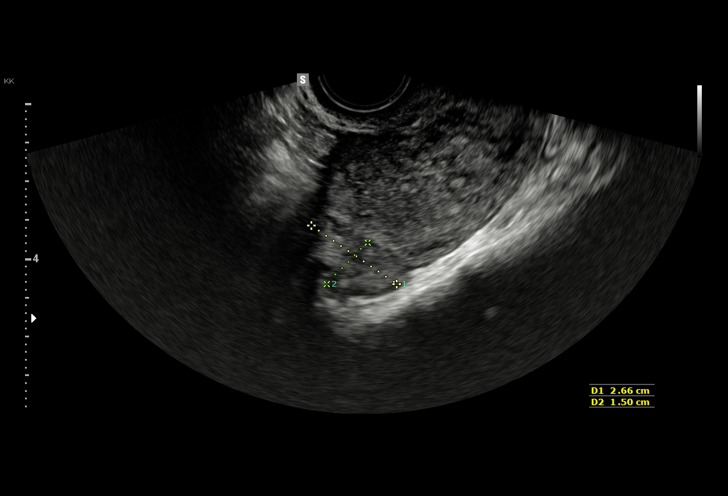
[im 64/64]
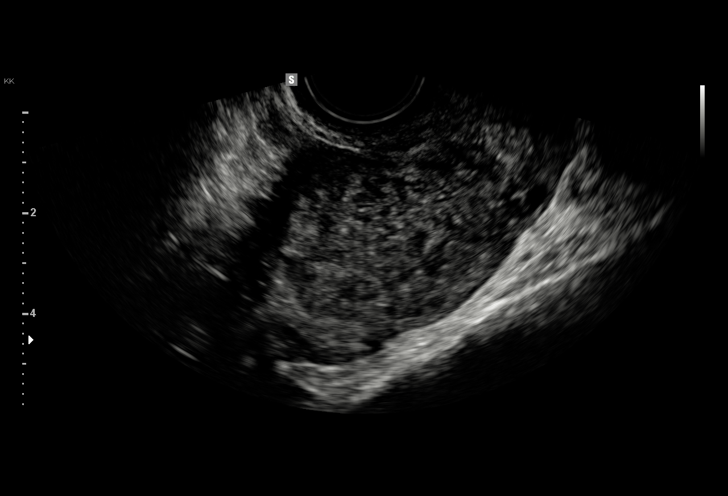

[15 of 25 positions shown; findings below may reference images not displayed]

FINDINGS: Uterus

Measurements: 8.0 x 4.1 x 5.4 cm. No fibroids or other mass
visualized.

Endometrium

Thickness: 7 mm.  No focal abnormality visualized.

Right ovary

Measurements: 2.7 x 1.5.2 cm. Normal appearance/no adnexal mass.

Left ovary

Measurements: 3.2 x 2.72.7 cm. There is a 1.8 x 1.6 x 1.9 cm cyst in
the left ovary.

Other findings:  Trace free fluid within the pelvis.
IMPRESSION: Small left ovarian cyst otherwise unremarkable pelvic ultrasound.

## 2016-07-09 ENCOUNTER — Emergency Department (HOSPITAL_COMMUNITY)
Admission: EM | Admit: 2016-07-09 | Discharge: 2016-07-09 | Disposition: A | Discharge status: Home / Self Care | Payer: Self-pay | Attending: Emergency Medicine | Admitting: Emergency Medicine

## 2016-07-09 ENCOUNTER — Encounter (HOSPITAL_COMMUNITY): Payer: Self-pay

## 2016-07-09 DIAGNOSIS — J069 Acute upper respiratory infection, unspecified: Secondary | ICD-10-CM | POA: Insufficient documentation

## 2016-07-09 DIAGNOSIS — Z79899 Other long term (current) drug therapy: Secondary | ICD-10-CM | POA: Insufficient documentation

## 2016-07-09 DIAGNOSIS — J45909 Unspecified asthma, uncomplicated: Secondary | ICD-10-CM | POA: Insufficient documentation

## 2016-07-09 DIAGNOSIS — F17210 Nicotine dependence, cigarettes, uncomplicated: Secondary | ICD-10-CM | POA: Insufficient documentation

## 2016-07-09 MED ORDER — BENZONATATE 100 MG PO CAPS: 100.0000 mg | ORAL_CAPSULE | Freq: Three times a day (TID) | ORAL | 0 refills | Status: DC

## 2016-07-09 NOTE — ED Triage Notes (Signed)
Patient complains of 4 days of cough, congestion and body aches, NAD.

## 2016-07-09 NOTE — Discharge Instructions (Signed)
Please read attached information. If you experience any new or worsening signs or symptoms please return to the emergency room for evaluation. Please follow-up with your primary care provider or specialist as discussed. Please use medication prescribed only as directed and discontinue taking if you have any concerning signs or symptoms.   °

## 2016-07-09 NOTE — ED Provider Notes (Signed)
[facility]-EMER[facility]ENCY DEPT Provider Note   CSN: [id number] Arrival date & time: 07/09/16  1359  By signing my name below, I, Shelly Silva, attest that this documentation has been prepared under the direction and in the presence of non-physician practitioner, Shelly MechanicJeffrey Dianey Suchy, PA-C. Electronically Signed: Freida Busmaniana Silva, Scribe. 07/09/2016. 3:17 PM.    History   Chief Complaint Chief Complaint  Patient presents with  . Nasal Congestion  . Generalized Body Aches     The history is provided by the patient. No language interpreter was used.    HPI Comments:  Shelly Silva is a 35 y.o. female who presents to the Emergency Department complaining of nasal congestion and rhinorrhea x 3-4 days . She reports associated cough. 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 She has tried various OTC meds without relief. She denies fever, ear pain, and SOB. Pt has a h/o asthma. Pt is also here to receive work note as she was sent home from work due to illness.     Past Medical History:  Diagnosis Date  . Abnormal Pap smear of cervix   . Asthma   . Headache(784.0)   . History of miscarriage   . HSV infection   . Infection    UTI  . S/P tubal ligation 11/04/2014    Patient Active Problem List   Diagnosis Date Noted  . 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 DILATATION & CURETTAGE/HYSTEROSCOPY WITH TRUECLEAR  2003, 2006, 2006  . DILATION AND CURETTAGE OF UTERUS    . GYNECOLOGIC CRYOSURGERY  2007  . TUBAL LIGATION N/A 11/03/2014   Procedure: POST PARTUM TUBAL LIGATION;  Surgeon: Adam PhenixJames G Arnold, MD;  Location: [facility];  Service: Gynecology;  Laterality: N/A;    OB History    Gravida Para Term Preterm AB Living   10 6 5 1 4 6    SAB TAB Ectopic Multiple Live Births   4     0 6       Home Medications    Prior to Admission medications   Medication Sig Start Date End Date  Taking? Authorizing Provider  acetaminophen (TYLENOL) 500 MG tablet Take 1,000 mg by mouth every 6 (six) hours as needed for mild pain or headache.    Historical Provider, MD  albuterol (PROVENTIL HFA;VENTOLIN HFA) 108 (90 BASE) MCG/ACT inhaler Inhale 2 puffs into the lungs every 6 (six) hours as needed for wheezing or shortness of breath.     Historical Provider, MD  benzonatate (TESSALON) 100 MG capsule Take 1 capsule (100 mg total) by mouth every 8 (eight) hours. 07/09/16   Shelly MechanicJeffrey Rosamary Boudreau, PA-C  diphenhydrAMINE (BENADRYL) 25 MG tablet Take 25 mg by mouth daily as needed for allergies.    Historical Provider, MD  ibuprofen (ADVIL,MOTRIN) 800 MG tablet Take 1 tablet (800 mg total) by mouth 3 (three) times daily with meals as needed for moderate pain. 05/02/16   Tereso NewcomerUgonna A Anyanwu, MD  megestrol (MEGACE) 20 MG tablet Take two tablets twice a day until bleeding stops then two tablets daily. 05/02/16   Tereso NewcomerUgonna A Anyanwu, MD  norgestimate-ethinyl estradiol (ORTHO-CYCLEN,SPRINTEC,PREVIFEM) 0.25-35 MG-MCG tablet Take 1 tablet by mouth daily. 03/15/16   Shelly Pariserri L Burleson, NP    Family History Family History  Problem Relation Age of Onset  . Diabetes Mother   . Hearing loss Neg Hx     Social  History Social History  Substance Use Topics  . Smoking status: Current Some Day Smoker    Packs/day: 0.25    Years: 4.00    Types: Cigarettes  . Smokeless tobacco: Never Used     Comment: off and on  . Alcohol use 0.6 oz/week    1 Shots of liquor per week     Comment: occ     Allergies   Patient has no known allergies.   Review of Systems Review of Systems  Constitutional: Negative for fever.  HENT: Positive for congestion and rhinorrhea. Negative for ear pain.   Respiratory: Positive for cough.      Physical Exam Updated Vital Signs BP 126/87 (BP Location: Right Arm)   Pulse 86   Temp 98.2 F (36.8 C) (Oral)   Resp 18   SpO2 99%   Physical Exam  Constitutional: She is oriented to person,  place, and time. She appears well-developed and well-nourished. No distress.  HENT:  Head: Normocephalic and atraumatic.  Right Ear: Tympanic membrane and external ear normal.  Left Ear: Tympanic membrane and external ear normal.  Nose: Rhinorrhea present.  Mouth/Throat: Oropharynx is clear and moist. No oropharyngeal exudate.  Eyes: Conjunctivae are normal.  Cardiovascular: Normal rate and regular rhythm.   Pulmonary/Chest: Breath sounds normal. No respiratory distress.  Abdominal: She exhibits no distension.  Neurological: She is alert and oriented to person, place, and time.  Skin: Skin is warm and dry.  Psychiatric: She has a normal mood and affect.  Nursing note and vitals reviewed.    ED Treatments / Results  DIAGNOSTIC STUDIES:  Oxygen Saturation is 99% on RA, normal by my interpretation.    COORDINATION OF CARE:  3:03 PM Discussed treatment plan with pt at bedside and pt agreed to plan.  Labs (all labs ordered are listed, but only abnormal results are displayed) Labs Reviewed - No data to display  EKG  EKG Interpretation None       Radiology No results found.  Procedures Procedures (including critical care time)  Medications Ordered in ED Medications - No data to display   Initial Impression / Assessment and Plan / ED Course  I have reviewed the triage vital signs and the nursing notes.  Pertinent labs & imaging results that were available during my care of the patient were reviewed by me and considered in my medical decision making (see chart for details).  Clinical Course     Labs:   Imaging:   Consults:   Therapeutics:   Discharge Meds:  Assessment/Plan:   Patient presents with viral URI. Advised to rest.  Patient is instructed to follow-up with PCP in 5-7 days if symptoms do not improve. Return precautions given, patient verbalized understanding and agreement for today's plan had no further questions or concerns at time of  discharge.   Final Clinical Impressions(s) / ED Diagnoses   Final diagnoses:  Viral upper respiratory tract infection    New Prescriptions New Prescriptions   BENZONATATE (TESSALON) 100 MG CAPSULE    Take 1 capsule (100 mg total) by mouth every 8 (eight) hours.   I personally performed the services described in this documentation, which was scribed in my presence. The recorded information has been reviewed and is accurate.     Shelly MechanicJeffrey Jyron Turman, PA-C 07/09/16 1533    Benjiman CoreNathan Pickering, MD 07/09/16 1540

## 2016-07-09 NOTE — ED Notes (Signed)
Pt reports nasal congestion and low back pain d/t work.  Pt works at a rehab facility.  Pt is ambulatory without difficulty.  Denies any fever

## 2016-07-22 ENCOUNTER — Encounter (HOSPITAL_COMMUNITY): Payer: Self-pay | Admitting: Emergency Medicine

## 2016-07-22 ENCOUNTER — Emergency Department (HOSPITAL_COMMUNITY)
Admission: EM | Admit: 2016-07-22 | Discharge: 2016-07-22 | Disposition: A | Discharge status: Home / Self Care | Payer: Self-pay | Attending: Emergency Medicine | Admitting: Emergency Medicine

## 2016-07-22 DIAGNOSIS — J45909 Unspecified asthma, uncomplicated: Secondary | ICD-10-CM | POA: Insufficient documentation

## 2016-07-22 DIAGNOSIS — S39012A Strain of muscle, fascia and tendon of lower back, initial encounter: Secondary | ICD-10-CM | POA: Insufficient documentation

## 2016-07-22 DIAGNOSIS — M25522 Pain in left elbow: Secondary | ICD-10-CM | POA: Insufficient documentation

## 2016-07-22 DIAGNOSIS — M25521 Pain in right elbow: Secondary | ICD-10-CM

## 2016-07-22 DIAGNOSIS — Y929 Unspecified place or not applicable: Secondary | ICD-10-CM | POA: Insufficient documentation

## 2016-07-22 DIAGNOSIS — Y939 Activity, unspecified: Secondary | ICD-10-CM | POA: Insufficient documentation

## 2016-07-22 DIAGNOSIS — X58XXXA Exposure to other specified factors, initial encounter: Secondary | ICD-10-CM | POA: Insufficient documentation

## 2016-07-22 DIAGNOSIS — Y999 Unspecified external cause status: Secondary | ICD-10-CM | POA: Insufficient documentation

## 2016-07-22 DIAGNOSIS — F17210 Nicotine dependence, cigarettes, uncomplicated: Secondary | ICD-10-CM | POA: Insufficient documentation

## 2016-07-22 MED ORDER — CYCLOBENZAPRINE HCL 10 MG PO TABS: 10.0000 mg | ORAL_TABLET | Freq: Every evening | ORAL | 0 refills | Status: DC | PRN

## 2016-07-22 MED ORDER — MELOXICAM 15 MG PO TABS: 15.0000 mg | ORAL_TABLET | Freq: Every day | ORAL | 0 refills | Status: DC

## 2016-07-22 NOTE — ED Notes (Signed)
PT is in stable condition upon d/c and ambulates from ED. 

## 2016-07-22 NOTE — ED Provider Notes (Signed)
[facility] DEPT Provider Note   CSN: [id number] Arrival date & time: 07/22/16  1618  By signing my name below, I, Linna DarnerRussell Turner, attest that this documentation has been prepared under the direction and in the presence of Terance HartKelly Eleanore Junio, PA-C. Electronically Signed: Linna Darnerussell Turner, Scribe. 07/22/2016. 5:30 PM.  History   Chief Complaint Chief Complaint  Patient presents with  . Elbow Pain  . Back Pain    The history is provided by the patient. No language interpreter was used.     HPI Comments: Shelly Silva is a 35 y.o. female who presents to the Emergency Department complaining of gradual onset, constant, gradually worsening, lower back pain for the last week and a half. 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 She denies h/o back problems. She notes she has been ambulating normally. She has tried ibuprofen 800 mg x2 daily with no pain relief. She denies lower extremity weakness/numbness/pain, bowel/bladder incontinence, saddle anesthesia, or any other associated symptoms.  She also complains of sudden onset, constant, posterior right elbow pain beginning this morning. She states she woke up and her right elbow was in pain. She endorses right elbow pain exacerbation with certain positions and movement but notes her ROM is normal. Pt denies known injury but believes her job at the nursing home could be the cause of this pain as well. She notes the ibuprofen 800 mg has not alleviated this pain either. No other treatments or medications tried. She denies redness, swelling, or any other associated symptoms.  Past Medical History:  Diagnosis Date  . Abnormal Pap smear of cervix   . Asthma   . Headache(784.0)   . History of miscarriage   . HSV infection   . Infection    UTI  . S/P tubal ligation 11/04/2014    Patient Active Problem List   Diagnosis  Date Noted  . 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 DILATATION & CURETTAGE/HYSTEROSCOPY WITH TRUECLEAR  2003, 2006, 2006  . DILATION AND CURETTAGE OF UTERUS    . GYNECOLOGIC CRYOSURGERY  2007  . TUBAL LIGATION N/A 11/03/2014   Procedure: POST PARTUM TUBAL LIGATION;  Surgeon: Adam PhenixJames G Arnold, MD;  Location: [facility];  Service: Gynecology;  Laterality: N/A;    OB History    Gravida Para Term Preterm AB Living   10 6 5 1 4 6    SAB TAB Ectopic Multiple Live Births   4     0 6       Home Medications    Prior to Admission medications   Medication Sig Start Date End Date Taking? Authorizing Provider  acetaminophen (TYLENOL) 500 MG tablet Take 1,000 mg by mouth every 6 (six) hours as needed for mild pain or headache.    Historical Provider, MD  albuterol (PROVENTIL HFA;VENTOLIN HFA) 108 (90 BASE) MCG/ACT inhaler Inhale 2 puffs into the lungs every 6 (six) hours as needed for wheezing or shortness of breath.     Historical Provider, MD  benzonatate (TESSALON) 100 MG capsule Take 1 capsule (100 mg total) by mouth every 8 (eight) hours. 07/09/16   Eyvonne MechanicJeffrey Hedges, PA-C  diphenhydrAMINE (BENADRYL) 25 MG tablet Take 25 mg by mouth daily as needed for allergies.    Historical Provider, MD  ibuprofen (ADVIL,MOTRIN) 800 MG  tablet Take 1 tablet (800 mg total) by mouth 3 (three) times daily with meals as needed for moderate pain. 05/02/16   Tereso Newcomer, MD  megestrol (MEGACE) 20 MG tablet Take two tablets twice a day until bleeding stops then two tablets daily. 05/02/16   Tereso Newcomer, MD  norgestimate-ethinyl estradiol (ORTHO-CYCLEN,SPRINTEC,PREVIFEM) 0.25-35 MG-MCG tablet Take 1 tablet by mouth daily. 03/15/16   Currie Paris, NP    Family History Family History  Problem Relation Age of Onset  . Diabetes Mother   . Hearing loss Neg Hx     Social History Social History  Substance Use Topics  . Smoking  status: Current Some Day Smoker    Packs/day: 0.25    Years: 4.00    Types: Cigarettes  . Smokeless tobacco: Never Used     Comment: off and on  . Alcohol use 0.6 oz/week    1 Shots of liquor per week     Comment: occ     Allergies   Patient has no known allergies.   Review of Systems Review of Systems  Gastrointestinal:       Negative for bowel incontinence.  Genitourinary:       Negative for bladder incontinence.  Musculoskeletal: Positive for arthralgias (right elbow) and back pain (lower). Negative for joint swelling and myalgias (lower extremities).  Skin: Negative for color change.  Neurological: Negative for weakness (lower extremities) and numbness (lower extremities).       Negative for saddle anesthesia.     Physical Exam Updated Vital Signs BP 117/79 (BP Location: Left Arm)   Pulse 90   Temp 98.5 F (36.9 C) (Oral)   Resp 18   SpO2 100%   Physical Exam  Constitutional: She is oriented to person, place, and time. She appears well-developed and well-nourished. No distress.  HENT:  Head: Normocephalic and atraumatic.  Eyes: Conjunctivae and EOM are normal.  Neck: Neck supple. No tracheal deviation present.  Cardiovascular: Normal rate.   Pulmonary/Chest: Effort normal. No respiratory distress.  Musculoskeletal: Normal range of motion.  Left elbow: No obvious swelling or deformity. Tenderness over medial epicondyle, olecranon, and lateral epicondyle. FROM. N/V intact.  Back: Inspection: No masses, deformity, or rash Palpation: Diffuse lumbar midline spinal tenderness with paraspinal muscle tenderness. No CVA tenderness Strength: 5/5 in lower extremities and normal plantar and dorsiflexion Sensation: Intact sensation with light touch in lower extremities bilaterally Gait: Normal gait Reflexes: Patellar reflex is 2+ bilaterally, Achilles is 2+ bilaterally   Neurological: She is alert and oriented to person, place, and time.  Skin: Skin is warm and dry.    Psychiatric: She has a normal mood and affect. Her behavior is normal.  Nursing note and vitals reviewed.    ED Treatments / Results  Labs (all labs ordered are listed, but only abnormal results are displayed) Labs Reviewed - No data to display  EKG  EKG Interpretation None       Radiology No results found.  Procedures Procedures (including critical care time)  DIAGNOSTIC STUDIES: Oxygen Saturation is 100% on RA, normal by my interpretation.    COORDINATION OF CARE: 5:38 PM Discussed treatment plan with pt at bedside and pt agreed to plan.  Medications Ordered in ED Medications - No data to display   Initial Impression / Assessment and Plan / ED Course  I have reviewed the triage vital signs and the nursing notes.  Pertinent labs & imaging results that were available during my care of the patient  were reviewed by me and considered in my medical decision making (see chart for details).  Clinical Course    35 year old female presents with low back strain and elbow pain most likely due to overuse injuries at work. Xrays not indicated since pain seems mild and there was no acute injury. Will d/c with course of NSAIDs and muscle relaxer. Ice and heat treatment discussed. Work note given. Patient is NAD, non-toxic, with stable VS. Patient is informed of clinical course, understands medical decision making process, and agrees with plan. Opportunity for questions provided and all questions answered. Return precautions given.  I personally performed the services described in this documentation, which was scribed in my presence. The recorded information has been reviewed and is accurate.   Final Clinical Impressions(s) / ED Diagnoses   Final diagnoses:  Strain of lumbar region, initial encounter  Right elbow pain    New Prescriptions New Prescriptions   No medications on file     Bethel BornKelly Marie Deari Sessler, PA-C 07/22/16 1814    Eber HongBrian Miller, MD 07/24/16 1128

## 2016-07-22 NOTE — ED Triage Notes (Signed)
Pt sts lower back pain and right elbow pain x 4 days worse with movement

## 2016-07-22 NOTE — Discharge Instructions (Signed)
Take Meloxicam once daily. Do not combine with Ibuprofen Take muscle relaxer at bedtime Use heating pad for your back, ice for your elbow Follow up with your family doctor if symptoms are not getting better

## 2016-08-07 ENCOUNTER — Ambulatory Visit (HOSPITAL_COMMUNITY)
Admission: RE | Admit: 2016-08-07 | Discharge: 2016-08-07 | Disposition: A | Discharge status: Home / Self Care | Payer: Self-pay | Source: Ambulatory Visit | Attending: Obstetrics and Gynecology | Admitting: Obstetrics and Gynecology

## 2016-08-07 ENCOUNTER — Encounter (HOSPITAL_COMMUNITY): Payer: Self-pay

## 2016-08-07 VITALS — BP 110/70 | Temp 99.3°F | Ht 62.0 in | Wt 175.4 lb

## 2016-08-07 DIAGNOSIS — R87810 Cervical high risk human papillomavirus (HPV) DNA test positive: Secondary | ICD-10-CM

## 2016-08-07 DIAGNOSIS — R87610 Atypical squamous cells of undetermined significance on cytologic smear of cervix (ASC-US): Secondary | ICD-10-CM

## 2016-08-07 DIAGNOSIS — Z1239 Encounter for other screening for malignant neoplasm of breast: Secondary | ICD-10-CM

## 2016-08-07 NOTE — Progress Notes (Signed)
[facility] due to recommending follow up for an abnormal Pap smear. Last Pap smear was completed 05/02/2016.  Pap Smear: Pap smear not completed today. Last [facility]2016 [facility] and [facility] with positive HPV. Referred patient to the [facility] for colposcopy to follow up for an abnormal Pap smear. Appointment scheduled for Tuesday, August 19, 2016 at 1020. Per patient has had one other abnormal Pap smear in 2007 that cryotherapy was completed for follow up. Last two Pap smear results are in EPIC.  Physical exam: Breasts Breasts symmetrical. No skin abnormalities bilateral breasts. No nipple retraction bilateral breasts. No nipple discharge bilateral breasts. No lymphadenopathy. No lumps palpated bilateral breasts. No complaints of pain or tenderness on exam. Screening mammogram recommended at age 35 unless clinically indicated prior.  Pelvic/Bimanual No Pap smear completed today since last Pap smear was 05/02/2016. Pap smear not indicated per BCCCP guidelines.   Smoking History: Patient has never smoked.  Patient Navigation: Patient education provided. Access to services provided for patient through [facility] program.

## 2016-08-07 NOTE — Patient Instructions (Signed)
Explained breast self awareness to Shelly Silva. Patient did not need a Pap smear today due to last Pap smear was 05/02/2016. Explained the colposcopy to patient the recommended follow up for her abnormal Pap smear. [facility] for [facility] to follow up for an abnormal Pap smear. Appointment scheduled for Tuesday, August 19, 2016 at 1020.Patient aware of appointment and will be there. Let patient know a screening mammogram is recommended at age 35 unless clinically indicated prior. Shelly Silva verbalized understanding.  Thi Sisemore, Kathaleen Maserhristine Poll, RN 4:48 PM

## 2016-08-08 ENCOUNTER — Encounter (HOSPITAL_COMMUNITY): Payer: Self-pay | Admitting: *Deleted

## 2016-08-19 ENCOUNTER — Other Ambulatory Visit (HOSPITAL_COMMUNITY)
Admission: RE | Admit: 2016-08-19 | Discharge: 2016-08-19 | Disposition: A | Discharge status: Home / Self Care | Payer: No Typology Code available for payment source | Source: Ambulatory Visit | Attending: Obstetrics and Gynecology | Admitting: Obstetrics and Gynecology

## 2016-08-19 ENCOUNTER — Ambulatory Visit (INDEPENDENT_AMBULATORY_CARE_PROVIDER_SITE_OTHER): Payer: Self-pay | Admitting: Obstetrics and Gynecology

## 2016-08-19 VITALS — BP 124/94 | HR 82 | Wt 202.0 lb

## 2016-08-19 DIAGNOSIS — R87610 Atypical squamous cells of undetermined significance on cytologic smear of cervix (ASC-US): Secondary | ICD-10-CM | POA: Insufficient documentation

## 2016-08-19 DIAGNOSIS — N939 Abnormal uterine and vaginal bleeding, unspecified: Secondary | ICD-10-CM | POA: Insufficient documentation

## 2016-08-19 DIAGNOSIS — R87810 Cervical high risk human papillomavirus (HPV) DNA test positive: Secondary | ICD-10-CM

## 2016-08-19 LAB — POCT PREGNANCY, URINE: Preg Test, Ur: NEGATIVE

## 2016-08-19 MED ORDER — MEGESTROL ACETATE 20 MG PO TABS: ORAL_TABLET | ORAL | 2 refills | Status: DC

## 2016-08-19 NOTE — Patient Instructions (Signed)
Colposcopy, Care After This sheet gives you information about how to care for yourself after your procedure. Your doctor may also give you more specific instructions. If you have problems or questions, contact your doctor. What can I expect after the procedure? If you did not have a tissue sample removed (did not have a biopsy), you may only have some spotting for a few days. You can go back to your normal activities. If you had a tissue sample removed, it is common to have:  Soreness and pain. This may last for a few days.  Light-headedness.  Mild bleeding from your vagina or dark-colored, grainy discharge from your vagina. This may last for a few days. You may need to wear a sanitary pad.  Spotting for at least 48 hours after the procedure. Follow these instructions at home:  Take over-the-counter and prescription medicines only as told by your doctor. Ask your doctor what medicines you can start taking again. This is very important if you take blood-thinning medicine.  Do not drive or use heavy machinery while taking prescription pain medicine.  For 3 days, or as long as your doctor tells you, avoid:  Douching.  Using tampons.  Having sex.  If you use birth control (contraception), keep using it.  Limit activity for the first day after the procedure. Ask your doctor what activities are safe for you.  It is up to you to get the results of your procedure. Ask your doctor when your results will be ready.  Keep all follow-up visits as told by your doctor. This is important. Contact a doctor if:  You get a skin rash. Get help right away if:  You are bleeding a lot from your vagina. It is a lot of bleeding if you are using more than one pad an hour for 2 hours in a row.  You have clumps of blood (blood clots) coming from your vagina.  You have a fever.  You have chills  You have pain in your lower belly (pelvic area).  You have signs of infection, such as vaginal  discharge that is:  Different than usual.  Yellow.  Bad-smelling.  You have very pain or cramps in your lower belly that do not get better with medicine.  You feel light-headed.  You feel dizzy.  You pass out (faint). Summary  If you did not have a tissue sample removed (did not have a biopsy), you may only have some spotting for a few days. You can go back to your normal activities.  If you had a tissue sample removed, it is common to have mild pain and spotting for 48 hours.  For 3 days, or as long as your doctor tells you, avoid douching, using tampons and having sex.  Get help right away if you have bleeding, very bad pain, or signs of infection. This information is not intended to replace advice given to you by your health care provider. Make sure you discuss any questions you have with your health care provider. Document Released: 02/04/2008 Document Revised: 05/07/2016 Document Reviewed: 05/07/2016 Elsevier Interactive Patient Education  2017 [employer]  

## 2016-08-19 NOTE — Progress Notes (Signed)
GYNECOLOGY CLINIC COLPOSCOPY VISIT AND PROCEDURE NOTE  35 y.o. [id number] here for colposcopy for ASCUS with POSITIVE high risk HPV pap smear on 05/02/2016. Prior cervical cytology and/or colposcopy findings: Patient reports abnormality requiring cryo some years ago, record reviewed and show negative PAP in 2008 and 2015. The patient reports the following prior treatments to the vulva/vagina/cervix: cryo. The patient is a cigarette smoker. The patient not immunosuppressed. The patient not pregnant. The patient is not taking anticoagulants and does not allergy to iodine.  Patient given informed consent, signed copy in the chart, time out was performed. Urine pregnancy test performed and confirmed to be negative.  Placed in lithotomy position.   Gross findings:   Vagina: normal  Vulva: normal  Cervix: appearing  Visualization after:  Acetic acid: SCJ visualized with difficulty, normal uptake  Lugol's solution:  Normal uptake throughout  Green or blue filter: no abnormal vessels   Biopsies obtained: ECC only  Colposcopy adequate? Yes  All specimens were labelled and sent to pathology.   Patient was given post procedure instructions.  Will follow up pathology and manage accordingly.    Patient additionally is being treated for AUB by Dr. Macon LargeAnyanwu. Surgery and IUD was discussed. Patient does want surgery now. Patient will need follow up with Dr. Macon LargeAnyanwu. She is currently on Magace which she is asking for refill on. Plan to refill discussed risks of increased blood clot with her smoking. Additionally discussed smoking cessation especially if she is going to have surgery.     Ernestina PennaNicholas Mylin Gignac, MD OB/GYN Fellow

## 2016-09-03 NOTE — Progress Notes (Signed)
Patietn with history of Cryo for unknown pathology. ASCUS HPV positive. Negative ECC. Plan for repeat co-testing in 1 year.

## 2016-09-04 ENCOUNTER — Telehealth: Payer: Self-pay

## 2016-09-04 NOTE — Telephone Encounter (Signed)
Pt returned call back.  Pt informed that colpo is normal and to follow up in one year with pap smear with co-testing.

## 2016-09-04 NOTE — Telephone Encounter (Signed)
Per Dr. Genevie AnnSchenk, pt colpo normal and needs pap smear in one year with co-testing.   LM for pt to return call in regards to results.

## 2016-12-03 ENCOUNTER — Encounter (HOSPITAL_COMMUNITY): Payer: Self-pay | Admitting: *Deleted

## 2016-12-03 ENCOUNTER — Emergency Department (HOSPITAL_COMMUNITY)
Admission: EM | Admit: 2016-12-03 | Discharge: 2016-12-03 | Disposition: A | Discharge status: Home / Self Care | Payer: No Typology Code available for payment source | Attending: Physician Assistant | Admitting: Physician Assistant

## 2016-12-03 DIAGNOSIS — J0300 Acute streptococcal tonsillitis, unspecified: Secondary | ICD-10-CM | POA: Insufficient documentation

## 2016-12-03 DIAGNOSIS — F17210 Nicotine dependence, cigarettes, uncomplicated: Secondary | ICD-10-CM | POA: Insufficient documentation

## 2016-12-03 DIAGNOSIS — J45909 Unspecified asthma, uncomplicated: Secondary | ICD-10-CM | POA: Insufficient documentation

## 2016-12-03 LAB — CBC
HEMATOCRIT: 41.1 % (ref 36.0–46.0)
HEMOGLOBIN: 14.1 g/dL (ref 12.0–15.0)
MCH: 32 pg (ref 26.0–34.0)
MCHC: 34.3 g/dL (ref 30.0–36.0)
MCV: 93.4 fL (ref 78.0–100.0)
Platelets: 270 10*3/uL (ref 150–400)
RBC: 4.4 MIL/uL (ref 3.87–5.11)
RDW: 12.9 % (ref 11.5–15.5)
WBC: 15.4 10*3/uL — ABNORMAL HIGH (ref 4.0–10.5)

## 2016-12-03 LAB — COMPREHENSIVE METABOLIC PANEL
ALBUMIN: 3.6 g/dL (ref 3.5–5.0)
ALK PHOS: 72 U/L (ref 38–126)
ALT: 23 U/L (ref 14–54)
ANION GAP: 10 (ref 5–15)
AST: 20 U/L (ref 15–41)
BUN: 6 mg/dL (ref 6–20)
CHLORIDE: 106 mmol/L (ref 101–111)
CO2: 21 mmol/L — AB (ref 22–32)
Calcium: 9.5 mg/dL (ref 8.9–10.3)
Creatinine, Ser: 0.85 mg/dL (ref 0.44–1.00)
GFR calc non Af Amer: >60 mL/min (ref >60)
GLUCOSE: 261 mg/dL — AB (ref 65–99)
POTASSIUM: 4.2 mmol/L (ref 3.5–5.1)
SODIUM: 137 mmol/L (ref 135–145)
Total Bilirubin: 0.7 mg/dL (ref 0.3–1.2)
Total Protein: 7.7 g/dL (ref 6.5–8.1)

## 2016-12-03 LAB — I-STAT CG4 LACTIC ACID, ED
LACTIC ACID, VENOUS: 2.01 mmol/L — AB (ref 0.5–1.9)
Lactic Acid, Venous: 1.73 mmol/L (ref 0.5–1.9)

## 2016-12-03 LAB — I-STAT BETA HCG BLOOD, ED (MC, WL, AP ONLY): I-stat hCG, quantitative: 10.6 m[IU]/mL — ABNORMAL HIGH (ref <5)

## 2016-12-03 LAB — RAPID STREP SCREEN (MED CTR MEBANE ONLY): STREPTOCOCCUS, GROUP A SCREEN (DIRECT): POSITIVE — AB

## 2016-12-03 LAB — POC URINE PREG, ED: PREG TEST UR: NEGATIVE

## 2016-12-03 LAB — LIPASE, BLOOD

## 2016-12-03 MED ORDER — AMOXICILLIN 500 MG PO CAPS: 500.0000 mg | ORAL_CAPSULE | Freq: Three times a day (TID) | ORAL | 0 refills | Status: DC

## 2016-12-03 MED ORDER — SODIUM CHLORIDE 0.9 % IV BOLUS (SEPSIS)
1000.0000 mL | Freq: Once | INTRAVENOUS | Status: AC
  Administered 2016-12-03: 1000 mL via INTRAVENOUS

## 2016-12-03 MED ORDER — ACETAMINOPHEN 325 MG PO TABS
650.0000 mg | ORAL_TABLET | Freq: Once | ORAL | Status: AC | PRN
  Administered 2016-12-03: 650 mg via ORAL

## 2016-12-03 MED ORDER — ACETAMINOPHEN 325 MG PO TABS
ORAL_TABLET | ORAL | Status: AC
  Filled 2016-12-03: qty 2

## 2016-12-03 NOTE — ED Triage Notes (Addendum)
Pt reports feeling bad since Sunday. Having sore throat, headache, bodyaches and fever. Had n/v for past two days. Mask on pt at triage. Reports pain and mucus in her throat, difficulty swallowing or opening her mouth. Airway is intact, able to speak in full sentences at triage.

## 2016-12-03 NOTE — ED Provider Notes (Signed)
[facility] Provider Note   CSN: [id number] Arrival date & time: 12/03/16  1003     History   Chief Complaint Chief Complaint  Patient presents with  . Emesis  . Fever    HPI Shelly Silva is a 36 y.o. female.  The history is provided by the patient. No language interpreter was used.  Emesis   This is a new problem. The current episode started 2 days ago. The problem has not changed since onset.The maximum temperature recorded prior to her arrival was 101 to 101.9 F. Associated symptoms include a fever.  Fever   Associated symptoms include vomiting.   Pt complains of a sore throat.  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 Abnormal Pap smear of cervix   . Asthma   . Headache(784.0)   . History of miscarriage   . HSV infection   . Infection    UTI  . S/P tubal ligation 11/04/2014    Patient Active Problem List   Diagnosis Date Noted  . 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 DILATATION & CURETTAGE/HYSTEROSCOPY WITH TRUECLEAR  2003, 2006, 2006  . DILATION AND CURETTAGE OF UTERUS    . GYNECOLOGIC CRYOSURGERY  2007  . TUBAL LIGATION N/A 11/03/2014   Procedure: POST PARTUM TUBAL LIGATION;  Surgeon: Adam Phenix, MD;  Location: [facility];  Service: Gynecology;  Laterality: N/A;    OB History    Gravida Para Term Preterm AB Living   SAB TAB Ectopic Multiple Live Births   4     0 6       Home Medications    Prior to Admission medications   Medication Sig Start Date End Date Taking? Authorizing Provider  NON FORMULARY Take 1 tablet by mouth daily. Birth Control - Pt unsure of med name.   Yes Historical Provider, MD  megestrol (MEGACE) 20 MG tablet Take two tablets twice a day until bleeding stops then two tablets daily. Patient not taking: Reported on 12/03/2016 08/19/16   Lorne Skeens, MD    Family  History Family History  Problem Relation Age of Onset  . Diabetes Mother   . Breast cancer Paternal Aunt   . Hearing loss Neg Hx     Social History Social History  Substance Use Topics  . Smoking status: Current Some Day Smoker    Packs/day: 0.25    Years: 4.00    Types: Cigarettes  . Smokeless tobacco: Never Used     Comment: off and on  . Alcohol use 0.6 oz/week    1 Shots of liquor per week     Comment: occ     Allergies   Patient has no known allergies.   Review of Systems Review of Systems  Constitutional: Positive for fever.  Gastrointestinal: Positive for vomiting.  All other systems reviewed and are negative.    Physical Exam Updated Vital Signs BP 117/82 (BP Location: Right Arm)   Pulse 99   Temp 99.3 F (37.4 C) (Oral)   Resp 16   SpO2 100%   Physical Exam  Constitutional: She appears well-developed and well-nourished. No distress.  HENT:  Head: Normocephalic and atraumatic.  Mouth/Throat: Oropharyngeal exudate present.  Swelling  Eyes: Conjunctivae are normal.  Neck: Neck supple.  Cardiovascular: Normal rate and regular rhythm.   No  murmur heard. Pulmonary/Chest: Effort normal and breath sounds normal. No respiratory distress.  Abdominal: Soft. There is no tenderness.  Musculoskeletal: She exhibits no edema.  Neurological: She is alert.  Skin: Skin is warm and dry.  Psychiatric: She has a normal mood and affect.  Nursing note and vitals reviewed.    ED Treatments / Results  Labs (all labs ordered are listed, but only abnormal results are displayed) Labs Reviewed  RAPID STREP SCREEN (NOT AT [facility]) - Abnormal; Notable for the following:       Result Value   Streptococcus, Group A Screen (Direct) POSITIVE (*)    All other components within normal limits  COMPREHENSIVE METABOLIC PANEL - Abnormal; Notable for the following:    CO2 21 (*)    Glucose, Bld 261 (*)    All other components within normal limits  CBC - Abnormal; Notable for  the following:    WBC 15.4 (*)    All other components within normal limits  LIPASE, BLOOD - Abnormal; Notable for the following:    Lipase <10 (*)    All other components within normal limits  I-STAT BETA HCG BLOOD, ED ([facility], [facility], [facility] ONLY) - Abnormal; Notable for the following:    I-stat hCG, quantitative 10.6 (*)    All other components within normal limits  I-STAT CG4 LACTIC ACID, ED - Abnormal; Notable for the following:    Lactic Acid, Venous 2.01 (*)    All other components within normal limits  URINALYSIS, ROUTINE W REFLEX MICROSCOPIC  I-STAT CG4 LACTIC ACID, ED    EKG  EKG Interpretation None       Radiology No results found.  Procedures Procedures (including critical care time)  Medications Ordered in ED Medications  acetaminophen (TYLENOL) tablet 650 mg (650 mg Oral Given 12/03/16 1025)  sodium chloride 0.9 % bolus 1,000 mL (1,000 mLs Intravenous New Bag/Given 12/03/16 1227)     Initial Impression / Assessment and Plan / ED Course  I have reviewed the triage vital signs and the nursing notes.  Pertinent labs & imaging results that were available during my care of the patient were reviewed by me and considered in my medical decision making (see chart for details).     Strep positive   Final Clinical Impressions(s) / ED Diagnoses   Final diagnoses:  Strep tonsillitis    New Prescriptions New Prescriptions   AMOXICILLIN (AMOXIL) 500 MG CAPSULE    Take 1 capsule (500 mg total) by mouth 3 (three) times daily.  An After Visit Summary was printed and given to the patient.    Lonia Skinner Astatula, PA-C 12/03/16 1508    Courteney Randall An, MD 12/05/16 [phone]

## 2016-12-03 NOTE — Discharge Instructions (Signed)
Return if any problems.

## 2017-03-05 ENCOUNTER — Ambulatory Visit (INDEPENDENT_AMBULATORY_CARE_PROVIDER_SITE_OTHER): Payer: Self-pay | Admitting: Obstetrics & Gynecology

## 2017-03-05 ENCOUNTER — Ambulatory Visit (INDEPENDENT_AMBULATORY_CARE_PROVIDER_SITE_OTHER): Payer: Self-pay | Admitting: Clinical

## 2017-03-05 ENCOUNTER — Encounter: Payer: Self-pay | Admitting: Obstetrics & Gynecology

## 2017-03-05 VITALS — BP 127/90 | HR 87 | Ht 62.0 in | Wt 158.0 lb

## 2017-03-05 DIAGNOSIS — B373 Candidiasis of vulva and vagina: Secondary | ICD-10-CM

## 2017-03-05 DIAGNOSIS — R4589 Other symptoms and signs involving emotional state: Secondary | ICD-10-CM

## 2017-03-05 DIAGNOSIS — Z029 Encounter for administrative examinations, unspecified: Secondary | ICD-10-CM

## 2017-03-05 DIAGNOSIS — B3731 Acute candidiasis of vulva and vagina: Secondary | ICD-10-CM

## 2017-03-05 DIAGNOSIS — D649 Anemia, unspecified: Secondary | ICD-10-CM

## 2017-03-05 DIAGNOSIS — F4321 Adjustment disorder with depressed mood: Secondary | ICD-10-CM

## 2017-03-05 DIAGNOSIS — N939 Abnormal uterine and vaginal bleeding, unspecified: Secondary | ICD-10-CM

## 2017-03-05 DIAGNOSIS — N766 Ulceration of vulva: Secondary | ICD-10-CM

## 2017-03-05 DIAGNOSIS — B009 Herpesviral infection, unspecified: Secondary | ICD-10-CM | POA: Insufficient documentation

## 2017-03-05 DIAGNOSIS — R6889 Other general symptoms and signs: Secondary | ICD-10-CM

## 2017-03-05 MED ORDER — FLUCONAZOLE 150 MG PO TABS: 150.0000 mg | ORAL_TABLET | ORAL | 3 refills | Status: DC

## 2017-03-05 MED ORDER — MEGESTROL ACETATE 20 MG PO TABS: ORAL_TABLET | ORAL | 2 refills | Status: DC

## 2017-03-05 MED ORDER — VALACYCLOVIR HCL 1 G PO TABS: 1000.0000 mg | ORAL_TABLET | Freq: Every day | ORAL | 3 refills | Status: DC

## 2017-03-05 MED ORDER — FERROUS SULFATE 325 (65 FE) MG PO TABS: 325.0000 mg | ORAL_TABLET | Freq: Two times a day (BID) | ORAL | 1 refills | Status: DC

## 2017-03-05 NOTE — Progress Notes (Signed)
Patient verbally consented to meet with Behavioral Health Clinician about presenting concerns. Dr Anyanwu aware.    

## 2017-03-05 NOTE — Progress Notes (Signed)
GYNECOLOGY OFFICE VISIT NOTE  History:  36 y.o. [id number] here today for discussion about evaluation of AUB. Not alleviated by OCPs in the past, was on Megace for a while.  Does not want IUD, endometrial ablation, desires definitive surgery in the form of hysterectomy.  Currently has small amount of bleeding, but can get heavy. Very irregular.  Feels very lightheaded and dizzy, also feels very tired.  Patient also desires evaluation of persistent and worsening vulvar irritation, not responsive to Monistat.  Reports feeling sad, already been seen by [facility] counselor prior to my encounter with her. Denies any fevers, chills, sweats, dysuria, nausea, vomiting, GI or GU symptoms or other general symptoms.  Past Medical History:  Diagnosis Date  . Abnormal Pap smear of cervix   . Asthma   . Headache(784.0)   . History of miscarriage   . HSV infection   . Infection    UTI  . S/P tubal ligation 11/04/2014    Past Surgical History:  Procedure Laterality Date  . DILATATION & CURETTAGE/HYSTEROSCOPY WITH TRUECLEAR  2003, 2006, 2006  . DILATION AND CURETTAGE OF UTERUS    . GYNECOLOGIC CRYOSURGERY  2007  . TUBAL LIGATION N/A 11/03/2014   Procedure: POST PARTUM TUBAL LIGATION;  Surgeon: Adam Phenix, MD;  Location: [facility];  Service: Gynecology;  Laterality: N/A;    The following portions of the patient's history were reviewed and updated as appropriate: allergies, current medications, past family history, past medical history, past social history, past surgical history and problem list.   Health Maintenance:  ASCUS pap and positive HRHPV on 05/02/2016, benign colposcopy in 08/2016.    Review of Systems:  Pertinent items noted in HPI and remainder of comprehensive ROS otherwise negative.   Objective:  Physical Exam BP 127/90   Pulse 87   Ht 5\' 2"  (1.575 m)   Wt 158 lb (71.7 kg)   LMP 03/05/2017 (Exact Date)   BMI 28.90 kg/m  CONSTITUTIONAL: Well-developed, well-nourished female in no acute  distress.  HENT:  Normocephalic, atraumatic. External right and left ear normal. Oropharynx is clear and moist EYES: Conjunctivae and EOM are normal. Pupils are equal, round, and reactive to light. No scleral icterus.  NECK: Normal range of motion, supple, no masses SKIN: Skin is warm and dry. No rash noted. Not diaphoretic. No erythema. No pallor. NEUROLOGIC: Alert and oriented to person, place, and time. Normal reflexes, muscle tone coordination. No cranial nerve deficit noted. PSYCHIATRIC: Normal mood and affect. Normal behavior. Normal judgment and thought content. CARDIOVASCULAR: Normal heart rate noted RESPIRATORY: Effort and breath sounds normal, no problems with respiration noted ABDOMEN: Soft, no distention noted.   PELVIC: Diffuse erythema and some ulcerations involving entire vulva and anogenital area. Some white substance noted on external area consistent with Monistat.  Small amount of blood noted at introitus. Internal exam not done.    MUSCULOSKELETAL: Normal range of motion. No edema noted.  Labs and Imaging 02/04/2016 TRANSVAGINAL ULTRASOUND PELVIS  CLINICAL DATA:  36 year old female with history of ovarian cyst presenting with right lower quadrant abdominal pain. COMPARISON:  Ultrasound dated 10/09/2015 FINDINGS: Uterus  Measurements: 8.0 x 4.1 x 5.4 cm. No fibroids or other mass visualized. Endometrium Thickness: 7 mm.  No focal abnormality visualized. Right ovary Measurements: 2.7 x 1.5.2 cm. Normal appearance/no adnexal mass. Left ovary Measurements: 3.2 x 2.72.7 cm. There is a 1.8 x 1.6 x 1.9 cm cyst in the left ovary. Other findings:  Trace free fluid within the pelvis. IMPRESSION: Small  left ovarian cyst otherwise unremarkable pelvic ultrasound.   Assessment & Plan:  1. Sad mood [facility] - Ambulatory referral to Integrated Behavioral Health  2. Abnormal uterine bleeding (AUB) Discussed management options for  abnormal uterine bleeding including tranexamic acid (Lysteda), oral progesterone (Megace), Depo Provera, Mirena IUD, endometrial ablation (Novasure/Hydrothermal Ablation) or hysterectomy as definitive surgical management.  Discussed risks and benefits of each method.   Patient desires hysterectomy.  Printed patient education handouts were given to the patient to review at home.  Megace prescribed as needed for now, bleeding precautions reviewed.  Patient is awaiting insurance/payment plans prior to scheduling. - CBC - ferrous sulfate (FERROUSUL) 325 (65 FE) MG tablet; Take 1 tablet (325 mg total) by mouth 2 (two) times daily.  Dispense: 60 tablet; Refill: 1 - megestrol (MEGACE) 20 MG tablet; Take two tablets twice a day until bleeding stops then two tablets daily.  Dispense: 120 tablet; Refill: 2  3. Fatigue associated with anemia - CBC  4. Sensation of feeling cold - TSH  5. Vulvar ulceration 6. 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 Valtrex prescribed. - valACYclovir (VALTREX) 1000 MG tablet; Take 1 tablet (1,000 mg total) by mouth daily. Take for 5 days  Dispense: 5 tablet; Refill: 3  7. Yeast vaginitis - fluconazole (DIFLUCAN) 150 MG tablet; Take 1 tablet (150 mg total) by mouth every 3 (three) days. For three doses  Dispense: 3 tablet; Refill: 3   Routine preventative health maintenance measures emphasized. Please refer to After Visit Summary for other counseling recommendations.   Return if symptoms worsen or fail to improve.  Total face-to-face time with patient: 25 minutes. Over 50% of encounter was spent on counseling and coordination of care.   Jaynie CollinsUGONNA  Shataya Winkles, MD, FACOG Attending Obstetrician & Gynecologist, [facility], [facility]

## 2017-03-05 NOTE — BH Specialist Note (Signed)
Integrated Behavioral Health Initial Visit  MRN: [id number] Name: Shelly ApoMarkesha C Mcgregor   Session Start time: 10:10 Session End time: 10:28 Total time: 20 minutes  Type of Service: Integrated Behavioral Health- Individual/Family Interpretor:No. Interpretor Name and Language: n/a   Warm Hand Off Completed.       SUBJECTIVE: Shelly Silva is a 36 y.o. female accompanied by patient. Patient was referred by Dr Macon LargeAnyanwu for depression. Patient reports the following symptoms/concerns: Pt primary concern today is feeling sad, tired, oversleeping, irritable; has never felt this previously, no recent life changes. Duration of problem: Over one month; Severity of problem: moderate  OBJECTIVE: Mood: Depressed and Affect: Depressed Risk of harm to self or others: No plan to harm self or others   LIFE CONTEXT: Family and Social: Lives with her 6 children, ages 632-14. 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 Patient may benefit from psychoeducation and brief therapeutic intervention regarding coping with symptoms of depression.  PLAN: 1. Follow up with behavioral health clinician on : As needed 2. Behavioral recommendations:  -Iron levels checked today - Consider taking daily 20 minute walk, in sunshine - Read educational material regarding coping with symptoms of depression 3. Referral(s): [facility] 4. "From scale of 1-10, how likely are you to follow plan?": 8  Rae LipsJamie C Roi Jafari, LCSWA   Dep[facility]  03/05/2017 04/18/2014  Decreased Interest 2 0  Down, Depressed, Hopeless 0 0  PHQ - 2 Score 2 0  Altered sleeping 3 -  Tired, decreased energy 3 -  Change in appetite 2 -  Feeling bad or failure about yourself  2 -  Trouble concentrating 0 -  Moving slowly or fidgety/restless 0 -  Suicidal thoughts 0 -  PHQ-9 Score 12 -   GAD 7 : Generalized Anxiety Score 03/05/2017  Nervous, Anxious, on Edge 0  Control/stop worrying 0  Worry too much - different things 1  Trouble relaxing 0  Restless 1  Easily annoyed or irritable 2  Afraid - awful might happen 0  Total GAD 7 Score 4

## 2017-03-05 NOTE — Patient Instructions (Signed)
Vaginal Hysterectomy A vaginal hysterectomy is a procedure to remove all or part of the uterus through a small incision in the vagina. In this procedure, your health care provider may remove your entire uterus, including the lower end (cervix). You may need a vaginal hysterectomy to treat:  Uterine fibroids.  A condition that causes the lining of the uterus to grow in other areas (endometriosis).  Problems with pelvic support.  Cancer of the cervix, ovaries, uterus, or tissue that lines the uterus (endometrium).  Excessive (dysfunctional) uterine bleeding.  When removing your uterus, your health care provider may also remove the organs that produce eggs (ovaries) and the tubes that carry eggs to your uterus (fallopian tubes). After a vaginal hysterectomy, you will no longer be able to have a baby. You will also no longer get your menstrual period. Tell a health care provider about:  Any allergies you have.  All medicines you are taking, including vitamins, herbs, eye drops, creams, and over-the-counter medicines.  Any problems you or family members have had with anesthetic medicines.  Any blood disorders you have.  Any surgeries you have had.  Any medical conditions you have.  Whether you are pregnant or may be pregnant. What are the risks? Generally, this is a safe procedure. However, problems may occur, including:  Bleeding.  Infection.  A blood clot that forms in your leg and travels to your lungs (pulmonary embolism).  Damage to surrounding organs.  Pain during sex.  What happens before the procedure?  Ask your health care provider what organs will be removed during surgery.  Ask your health care provider about: ? Changing or stopping your regular medicines. This is especially important if you are taking diabetes medicines or blood thinners. ? Taking medicines such as aspirin and ibuprofen. These medicines can thin your blood. Do not take these medicines before  your procedure if your health care provider instructs you not to.  Follow instructions from your health care provider about eating or drinking restrictions.  Do not use any tobacco products, such as cigarettes, chewing tobacco, and e-cigarettes. If you need help quitting, ask your health care provider.  Plan to have someone take you home after discharge from the hospital. What happens during the procedure?  To reduce your risk of infection: ? Your health care team will wash or sanitize their hands. ? Your skin will be washed with soap.  An IV tube will be inserted into one of your veins.  You may be given antibiotic medicine to help prevent infection.  You will be given one or more of the following: ? A medicine to help you relax (sedative). ? A medicine to numb the area (local anesthetic). ? A medicine to make you fall asleep (general anesthetic). ? A medicine that is injected into an area of your body to numb everything beyond the injection site (regional anesthetic).  Your surgeon will make an incision in your vagina.  Your surgeon will locate and remove all or part of your uterus.  Your ovaries and fallopian tubes may be removed at the same time.  The incision will be closed with stitches (sutures) that dissolve over time. The procedure may vary among health care providers and hospitals. What happens after the procedure?  Your blood pressure, heart rate, breathing rate, and blood oxygen level will be monitored often until the medicines you were given have worn off.  You will be encouraged to get up and walk around after a few hours to help prevent   complications.  You may have IV tubes in place for a few days.  You will be given pain medicine as needed.  Do not drive for 24 hours if you were given a sedative. This information is not intended to replace advice given to you by your health care provider. Make sure you discuss any questions you have with your health care  provider. Document Released: 12/10/2015 Document Revised: 01/24/2016 Document Reviewed: 09/02/2015 Elsevier Interactive Patient Education  2018 [employer]  

## 2017-03-06 ENCOUNTER — Telehealth: Payer: Self-pay

## 2017-03-06 LAB — CBC
Hematocrit: 46.2 % (ref 34.0–46.6)
Hemoglobin: 15.3 g/dL (ref 11.1–15.9)
MCH: 32.7 pg (ref 26.6–33.0)
MCHC: 33.1 g/dL (ref 31.5–35.7)
MCV: 99 fL — ABNORMAL HIGH (ref 79–97)
Platelets: 227 x10E3/uL (ref 150–379)
RBC: 4.68 x10E6/uL (ref 3.77–5.28)
RDW: 13.1 % (ref 12.3–15.4)
WBC: 5.7 x10E3/uL (ref 3.4–10.8)

## 2017-03-06 LAB — TSH: TSH: 1.79 u[IU]/mL (ref 0.450–4.500)

## 2017-03-06 NOTE — Telephone Encounter (Signed)
Called patient regarding test results. Inform her to continued taking megace for her bleeding. Patient verbalizes understanding at this time.

## 2017-03-06 NOTE — Telephone Encounter (Signed)
-----   Message from Tereso NewcomerUgonna A Anyanwu, MD sent at 03/06/2017  8:24 AM EDT ----- Please inform patient that she is not anemic and her TSH is normal. Continue Megace as prescribed for irregular bleeding for now.

## 2017-03-30 ENCOUNTER — Other Ambulatory Visit: Payer: Self-pay | Admitting: Obstetrics & Gynecology

## 2017-03-30 DIAGNOSIS — N766 Ulceration of vulva: Secondary | ICD-10-CM

## 2017-04-06 ENCOUNTER — Encounter (HOSPITAL_COMMUNITY): Payer: Self-pay

## 2017-04-07 ENCOUNTER — Encounter: Payer: Self-pay | Admitting: *Deleted

## 2017-04-07 NOTE — Progress Notes (Signed)
Patient scheduled for surgery 06/02/17.  Uninsured.  Surgery not urgent/emergent per Dr. Macon LargeAnyanwu.  Certified letter with charity care application mailed to patient.  Patient aware if application not received 30 days prior to surgery, her surgery may be rescheduled.

## 2017-05-12 ENCOUNTER — Encounter (HOSPITAL_COMMUNITY): Payer: Self-pay

## 2017-07-01 NOTE — Patient Instructions (Addendum)
Your procedure is scheduled on:  Tuesday, Nov. 13, 2018  [facility] at:  11:30 AM  Pick up the phone at the desk and dial [phone].  Call this number if you have problems the morning of surgery: [phone].  Remember: Do NOT eat food:  After Midnight Monday  Do NOT drink clear liquids after:  7:00 AM Tuesday  Take these medicines the morning of surgery with a SIP OF WATER:  None  Stop ALL herbal medications at this time  Do NOT smoke the day of surgery.  Do NOT wear jewelry (body piercing), metal hair clips/bobby pins, make-up, artifical eyelashes or nail polish. Do NOT wear lotions, powders, or perfumes.  You may wear deodorant. Do NOT shave for 48 hours prior to surgery. Do NOT bring valuables to the hospital. Contacts, dentures, or bridgework may not be worn into surgery.  Leave suitcase in car.  After surgery it may be brought to your room.  For patients admitted to the hospital, checkout time is 11:00 AM the day of discharge.  Bring a copy of your healthcare power of attorney and living will documents.    Incentive Spirometer  An incentive spirometer is a tool that can help keep your lungs clear and active. This tool measures how well you are filling your lungs with each breath. Taking long deep breaths may help reverse or decrease the chance of developing breathing (pulmonary) problems (especially infection) following:  A long period of time when you are unable to move or be active. BEFORE THE PROCEDURE   If the spirometer includes an indicator to show your best effort, your nurse or respiratory therapist will set it to a desired goal.  If possible, sit up straight or lean slightly forward. Try not to slouch.  Hold the incentive spirometer in an upright position. INSTRUCTIONS FOR USE  1. Sit on the edge of your bed if possible, or sit up as far as you can in bed or on a chair. 2. Hold the incentive spirometer in an upright  position. 3. Breathe out normally. 4. Place the mouthpiece in your mouth and seal your lips tightly around it. 5. Breathe in slowly and as deeply as possible, raising the piston or the ball toward the top of the column. 6. Hold your breath for 3-5 seconds or for as long as possible. Allow the piston or ball to fall to the bottom of the column. 7. Remove the mouthpiece from your mouth and breathe out normally. 8. Rest for a few seconds and repeat Steps 1 through 7 at least 10 times every 1-2 hours when you are awake. Take your time and take a few normal breaths between deep breaths. 9. The spirometer may include an indicator to show your best effort. Use the indicator as a goal to work toward during each repetition. 10. After each set of 10 deep breaths, practice coughing to be sure your lungs are clear. If you have an incision (the cut made at the time of surgery), support your incision when coughing by placing a pillow or rolled up towels firmly against it. Once you are able to get out of bed, walk around indoors and cough well. You may stop using the incentive spirometer when instructed by your caregiver.  RISKS AND COMPLICATIONS  Take your time so you do not get dizzy or light-headed.  If you are in pain, you may need to take or ask for pain medication before doing incentive spirometry.  It is harder to take a deep breath if you are having pain. AFTER USE  Rest and breathe slowly and easily.  It can be helpful to keep track of a log of your progress. Your caregiver can provide you with a simple table to help with this. If you are using the spirometer at home, follow these instructions: SEEK MEDICAL CARE IF:   You are having difficultly using the spirometer.  You have trouble using the spirometer as often as instructed.  Your pain medication is not giving enough relief while using the spirometer.  You develop fever of 100.5 F (38.1 C) or higher. SEEK IMMEDIATE MEDICAL CARE IF:    You cough up bloody sputum that had not been present before.  You develop fever of 102 F (38.9 C) or greater.  You develop worsening pain at or near the incision site. MAKE SURE YOU:   Understand these instructions.  Will watch your condition.  Will get help right away if you are not doing well or get worse. Document Released: 12/29/2006 Document Revised: 11/10/2011 Document Reviewed: 03/01/2007 [facility] Patient Information 2014 [place], [place].   ________________________________________________________________________

## 2017-07-03 ENCOUNTER — Encounter (HOSPITAL_COMMUNITY): Payer: Self-pay

## 2017-07-03 ENCOUNTER — Inpatient Hospital Stay (HOSPITAL_COMMUNITY)
Admission: RE | Admit: 2017-07-03 | Discharge: 2017-07-03 | Disposition: A | Discharge status: Home / Self Care | Payer: No Typology Code available for payment source | Source: Ambulatory Visit

## 2017-07-03 ENCOUNTER — Encounter (HOSPITAL_COMMUNITY)
Admission: RE | Admit: 2017-07-03 | Discharge: 2017-07-03 | Disposition: A | Discharge status: Home / Self Care | Payer: Medicaid Other | Source: Ambulatory Visit | Attending: Obstetrics & Gynecology | Admitting: Obstetrics & Gynecology

## 2017-07-03 DIAGNOSIS — N939 Abnormal uterine and vaginal bleeding, unspecified: Secondary | ICD-10-CM | POA: Diagnosis not present

## 2017-07-03 DIAGNOSIS — Z01812 Encounter for preprocedural laboratory examination: Secondary | ICD-10-CM | POA: Insufficient documentation

## 2017-07-03 LAB — CBC
HEMATOCRIT: 38.6 % (ref 36.0–46.0)
HEMOGLOBIN: 13.4 g/dL (ref 12.0–15.0)
MCH: 33.2 pg (ref 26.0–34.0)
MCHC: 34.7 g/dL (ref 30.0–36.0)
MCV: 95.5 fL (ref 78.0–100.0)
Platelets: 306 10*3/uL (ref 150–400)
RBC: 4.04 MIL/uL (ref 3.87–5.11)
RDW: 12.3 % (ref 11.5–15.5)
WBC: 6 10*3/uL (ref 4.0–10.5)

## 2017-07-03 LAB — TYPE AND SCREEN
ABO/RH(D): A POS
ANTIBODY SCREEN: NEGATIVE

## 2017-07-14 ENCOUNTER — Ambulatory Visit (HOSPITAL_COMMUNITY): Payer: Medicaid Other | Admitting: Anesthesiology

## 2017-07-14 ENCOUNTER — Encounter (HOSPITAL_COMMUNITY): Payer: Self-pay

## 2017-07-14 ENCOUNTER — Observation Stay (HOSPITAL_COMMUNITY)
Admission: AD | Admit: 2017-07-14 | Discharge: 2017-07-15 | Disposition: A | Discharge status: Home / Self Care | Payer: Medicaid Other | Source: Ambulatory Visit | Attending: Obstetrics & Gynecology | Admitting: Obstetrics & Gynecology

## 2017-07-14 ENCOUNTER — Other Ambulatory Visit: Payer: Self-pay

## 2017-07-14 ENCOUNTER — Encounter (HOSPITAL_COMMUNITY)
Admission: AD | Disposition: A | Discharge status: Home / Self Care | Payer: Self-pay | Source: Ambulatory Visit | Attending: Obstetrics & Gynecology

## 2017-07-14 DIAGNOSIS — E669 Obesity, unspecified: Secondary | ICD-10-CM | POA: Insufficient documentation

## 2017-07-14 DIAGNOSIS — D649 Anemia, unspecified: Secondary | ICD-10-CM | POA: Diagnosis not present

## 2017-07-14 DIAGNOSIS — R87810 Cervical high risk human papillomavirus (HPV) DNA test positive: Secondary | ICD-10-CM

## 2017-07-14 DIAGNOSIS — Z79899 Other long term (current) drug therapy: Secondary | ICD-10-CM | POA: Diagnosis not present

## 2017-07-14 DIAGNOSIS — Z9851 Tubal ligation status: Secondary | ICD-10-CM | POA: Insufficient documentation

## 2017-07-14 DIAGNOSIS — N7091 Salpingitis, unspecified: Secondary | ICD-10-CM | POA: Diagnosis not present

## 2017-07-14 DIAGNOSIS — N939 Abnormal uterine and vaginal bleeding, unspecified: Principal | ICD-10-CM | POA: Insufficient documentation

## 2017-07-14 DIAGNOSIS — R87610 Atypical squamous cells of undetermined significance on cytologic smear of cervix (ASC-US): Secondary | ICD-10-CM | POA: Diagnosis present

## 2017-07-14 DIAGNOSIS — Z90710 Acquired absence of both cervix and uterus: Secondary | ICD-10-CM | POA: Diagnosis present

## 2017-07-14 DIAGNOSIS — F17210 Nicotine dependence, cigarettes, uncomplicated: Secondary | ICD-10-CM | POA: Insufficient documentation

## 2017-07-14 DIAGNOSIS — Z6829 Body mass index (BMI) 29.0-29.9, adult: Secondary | ICD-10-CM | POA: Insufficient documentation

## 2017-07-14 HISTORY — PX: BILATERAL SALPINGECTOMY: SHX5743

## 2017-07-14 HISTORY — PX: VAGINAL HYSTERECTOMY: SHX2639

## 2017-07-14 LAB — PREGNANCY, URINE: PREG TEST UR: NEGATIVE

## 2017-07-14 SURGERY — HYSTERECTOMY, VAGINAL
Anesthesia: General | Site: Vagina

## 2017-07-14 MED ORDER — SENNOSIDES-DOCUSATE SODIUM 8.6-50 MG PO TABS: 1.0000 | ORAL_TABLET | Freq: Every evening | ORAL | Status: DC | PRN

## 2017-07-14 MED ORDER — LACTATED RINGERS IV SOLN
INTRAVENOUS | Status: DC
  Administered 2017-07-14: 18:00:00 via INTRAVENOUS

## 2017-07-14 MED ORDER — ONDANSETRON HCL 4 MG/2ML IJ SOLN: INTRAMUSCULAR | Status: DC | PRN

## 2017-07-14 MED ORDER — HYDROMORPHONE HCL 1 MG/ML IJ SOLN
1.0000 mg | INTRAMUSCULAR | Status: DC | PRN
  Administered 2017-07-14: 1 mg via INTRAVENOUS
  Filled 2017-07-14: qty 1

## 2017-07-14 MED ORDER — IBUPROFEN 600 MG PO TABS
600.0000 mg | ORAL_TABLET | Freq: Four times a day (QID) | ORAL | Status: DC | PRN
  Administered 2017-07-14 – 2017-07-15 (×2): 600 mg via ORAL
  Filled 2017-07-14 (×3): qty 1

## 2017-07-14 MED ORDER — HYDROMORPHONE HCL 1 MG/ML IJ SOLN
0.2500 mg | INTRAMUSCULAR | Status: DC | PRN
  Administered 2017-07-14: 0.5 mg via INTRAVENOUS

## 2017-07-14 MED ORDER — ROCURONIUM BROMIDE 100 MG/10ML IV SOLN
INTRAVENOUS | Status: DC | PRN
  Administered 2017-07-14: 40 mg via INTRAVENOUS

## 2017-07-14 MED ORDER — LIDOCAINE HCL (CARDIAC) 20 MG/ML IV SOLN
INTRAVENOUS | Status: AC
Start: 2017-07-14 — End: 2017-07-14
  Filled 2017-07-14: qty 5

## 2017-07-14 MED ORDER — LACTATED RINGERS IV SOLN
INTRAVENOUS | Status: DC
  Administered 2017-07-14: 125 mL/h via INTRAVENOUS

## 2017-07-14 MED ORDER — FENTANYL CITRATE (PF) 100 MCG/2ML IJ SOLN
INTRAMUSCULAR | Status: DC | PRN
  Administered 2017-07-14 (×3): 100 ug via INTRAVENOUS
  Administered 2017-07-14: 50 ug via INTRAVENOUS

## 2017-07-14 MED ORDER — SCOPOLAMINE 1 MG/3DAYS TD PT72
1.0000 | MEDICATED_PATCH | Freq: Once | TRANSDERMAL | Status: DC
  Administered 2017-07-14: 1.5 mg via TRANSDERMAL

## 2017-07-14 MED ORDER — KETOROLAC TROMETHAMINE 30 MG/ML IJ SOLN: 30.0000 mg | Freq: Once | INTRAMUSCULAR | Status: DC

## 2017-07-14 MED ORDER — CEFOTETAN DISODIUM-DEXTROSE 2-2.08 GM-%(50ML) IV SOLR
2.0000 g | INTRAVENOUS | Status: AC
  Administered 2017-07-14: 2 g via INTRAVENOUS

## 2017-07-14 MED ORDER — BUPIVACAINE-EPINEPHRINE 0.5% -1:200000 IJ SOLN
INTRAMUSCULAR | Status: DC | PRN
  Administered 2017-07-14: 30 mL

## 2017-07-14 MED ORDER — SCOPOLAMINE 1 MG/3DAYS TD PT72
MEDICATED_PATCH | TRANSDERMAL | Status: AC
  Administered 2017-07-14: 1.5 mg via TRANSDERMAL
  Filled 2017-07-14: qty 1

## 2017-07-14 MED ORDER — PANTOPRAZOLE SODIUM 40 MG PO TBEC: 40.0000 mg | DELAYED_RELEASE_TABLET | Freq: Every day | ORAL | Status: DC

## 2017-07-14 MED ORDER — ONDANSETRON HCL 4 MG/2ML IJ SOLN: 4.0000 mg | Freq: Four times a day (QID) | INTRAMUSCULAR | Status: DC | PRN

## 2017-07-14 MED ORDER — PROMETHAZINE HCL 25 MG/ML IJ SOLN: 6.2500 mg | INTRAMUSCULAR | Status: DC | PRN

## 2017-07-14 MED ORDER — PROPOFOL 10 MG/ML IV BOLUS
INTRAVENOUS | Status: AC
  Filled 2017-07-14: qty 20

## 2017-07-14 MED ORDER — MAGNESIUM CITRATE PO SOLN: 1.0000 | Freq: Once | ORAL | Status: DC | PRN

## 2017-07-14 MED ORDER — MIDAZOLAM HCL 2 MG/2ML IJ SOLN
INTRAMUSCULAR | Status: AC
  Filled 2017-07-14: qty 2

## 2017-07-14 MED ORDER — MENTHOL 3 MG MT LOZG: 1.0000 | LOZENGE | OROMUCOSAL | Status: DC | PRN

## 2017-07-14 MED ORDER — ONDANSETRON HCL 4 MG/2ML IJ SOLN
INTRAMUSCULAR | Status: AC
  Filled 2017-07-14: qty 2

## 2017-07-14 MED ORDER — ALUM & MAG HYDROXIDE-SIMETH 200-200-20 MG/5ML PO SUSP: 30.0000 mL | ORAL | Status: DC | PRN

## 2017-07-14 MED ORDER — HYDROMORPHONE HCL 1 MG/ML IJ SOLN
INTRAMUSCULAR | Status: AC
  Administered 2017-07-14: 0.5 mg via INTRAVENOUS
  Filled 2017-07-14: qty 0.5

## 2017-07-14 MED ORDER — OXYCODONE HCL 5 MG PO TABS: 5.0000 mg | ORAL_TABLET | Freq: Once | ORAL | Status: DC | PRN

## 2017-07-14 MED ORDER — SUGAMMADEX SODIUM 200 MG/2ML IV SOLN
INTRAVENOUS | Status: DC | PRN
  Administered 2017-07-14: 200 mg via INTRAVENOUS

## 2017-07-14 MED ORDER — MIDAZOLAM HCL 2 MG/2ML IJ SOLN
INTRAMUSCULAR | Status: DC | PRN
  Administered 2017-07-14: 2 mg via INTRAVENOUS

## 2017-07-14 MED ORDER — ONDANSETRON HCL 4 MG/2ML IJ SOLN
INTRAMUSCULAR | Status: DC | PRN
  Administered 2017-07-14: 4 mg via INTRAVENOUS

## 2017-07-14 MED ORDER — ONDANSETRON HCL 4 MG PO TABS: 4.0000 mg | ORAL_TABLET | Freq: Four times a day (QID) | ORAL | Status: DC | PRN

## 2017-07-14 MED ORDER — SIMETHICONE 80 MG PO CHEW: 80.0000 mg | CHEWABLE_TABLET | Freq: Four times a day (QID) | ORAL | Status: DC | PRN

## 2017-07-14 MED ORDER — ZOLPIDEM TARTRATE 5 MG PO TABS: 5.0000 mg | ORAL_TABLET | Freq: Every evening | ORAL | Status: DC | PRN

## 2017-07-14 MED ORDER — DOCUSATE SODIUM 100 MG PO CAPS
100.0000 mg | ORAL_CAPSULE | Freq: Two times a day (BID) | ORAL | Status: DC
  Administered 2017-07-14: 100 mg via ORAL
  Filled 2017-07-14: qty 1

## 2017-07-14 MED ORDER — BUPIVACAINE-EPINEPHRINE (PF) 0.5% -1:200000 IJ SOLN
INTRAMUSCULAR | Status: AC
  Filled 2017-07-14: qty 30

## 2017-07-14 MED ORDER — DEXAMETHASONE SODIUM PHOSPHATE 10 MG/ML IJ SOLN
INTRAMUSCULAR | Status: DC | PRN
  Administered 2017-07-14: 4 mg via INTRAVENOUS

## 2017-07-14 MED ORDER — BISACODYL 10 MG RE SUPP: 10.0000 mg | Freq: Every day | RECTAL | Status: DC | PRN

## 2017-07-14 MED ORDER — KETOROLAC TROMETHAMINE 30 MG/ML IJ SOLN
INTRAMUSCULAR | Status: DC | PRN
  Administered 2017-07-14: 30 mg via INTRAVENOUS

## 2017-07-14 MED ORDER — OXYCODONE-ACETAMINOPHEN 5-325 MG PO TABS
1.0000 | ORAL_TABLET | ORAL | Status: DC | PRN
  Administered 2017-07-14: 2 via ORAL
  Administered 2017-07-15: 1 via ORAL
  Filled 2017-07-14: qty 2
  Filled 2017-07-14: qty 1

## 2017-07-14 MED ORDER — DEXAMETHASONE SODIUM PHOSPHATE 4 MG/ML IJ SOLN
INTRAMUSCULAR | Status: AC
  Filled 2017-07-14: qty 1

## 2017-07-14 MED ORDER — FERROUS SULFATE 325 (65 FE) MG PO TABS
325.0000 mg | ORAL_TABLET | Freq: Two times a day (BID) | ORAL | Status: DC
  Administered 2017-07-15: 325 mg via ORAL
  Filled 2017-07-14: qty 1

## 2017-07-14 MED ORDER — PROPOFOL 10 MG/ML IV BOLUS
INTRAVENOUS | Status: DC | PRN
  Administered 2017-07-14: 200 mg via INTRAVENOUS

## 2017-07-14 MED ORDER — OXYCODONE HCL 5 MG/5ML PO SOLN: 5.0000 mg | Freq: Once | ORAL | Status: DC | PRN

## 2017-07-14 MED ORDER — FENTANYL CITRATE (PF) 250 MCG/5ML IJ SOLN
INTRAMUSCULAR | Status: AC
  Filled 2017-07-14: qty 5

## 2017-07-14 MED ORDER — MEPERIDINE HCL 25 MG/ML IJ SOLN: 6.2500 mg | INTRAMUSCULAR | Status: DC | PRN

## 2017-07-14 MED ORDER — CEFOTETAN DISODIUM-DEXTROSE 2-2.08 GM-%(50ML) IV SOLR
INTRAVENOUS | Status: AC
  Filled 2017-07-14: qty 50

## 2017-07-14 SURGICAL SUPPLY — 26 items
CANISTER SUCT 3000ML PPV (MISCELLANEOUS) ×4 IMPLANT
CLOTH BEACON ORANGE TIMEOUT ST (SAFETY) ×4 IMPLANT
CONT PATH 16OZ SNAP LID 3702 (MISCELLANEOUS) ×2 IMPLANT
DECANTER SPIKE VIAL GLASS SM (MISCELLANEOUS) ×4 IMPLANT
GAUZE PACKING 2X5 YD STRL (GAUZE/BANDAGES/DRESSINGS) IMPLANT
GLOVE BIOGEL PI IND STRL 6.5 (GLOVE) ×2 IMPLANT
GLOVE BIOGEL PI IND STRL 7.0 (GLOVE) ×4 IMPLANT
GLOVE BIOGEL PI INDICATOR 6.5 (GLOVE) ×2
GLOVE BIOGEL PI INDICATOR 7.0 (GLOVE) ×4
GLOVE ECLIPSE 7.0 STRL STRAW (GLOVE) ×4 IMPLANT
GOWN STRL REUS W/TWL LRG LVL3 (GOWN DISPOSABLE) ×16 IMPLANT
NDL SPNL 22GX3.5 QUINCKE BK (NEEDLE) IMPLANT
NEEDLE HYPO 22GX1.5 SAFETY (NEEDLE) IMPLANT
NEEDLE SPNL 22GX3.5 QUINCKE BK (NEEDLE) IMPLANT
NS IRRIG 1000ML POUR BTL (IV SOLUTION) ×4 IMPLANT
PACK TRENDGUARD 600 HYBRD PROC (MISCELLANEOUS) IMPLANT
PACK VAGINAL WOMENS (CUSTOM PROCEDURE TRAY) ×4 IMPLANT
PAD OB MATERNITY 4.3X12.25 (PERSONAL CARE ITEMS) ×4 IMPLANT
SUT VIC AB 0 CT1 18XCR BRD8 (SUTURE) ×6 IMPLANT
SUT VIC AB 0 CT1 27 (SUTURE) ×8
SUT VIC AB 0 CT1 27XBRD ANBCTR (SUTURE) ×4 IMPLANT
SUT VIC AB 0 CT1 8-18 (SUTURE) ×8
SUT VICRYL 0 TIES 12 18 (SUTURE) ×4 IMPLANT
TOWEL OR 17X24 6PK STRL BLUE (TOWEL DISPOSABLE) ×8 IMPLANT
TRAY FOLEY CATH SILVER 14FR (SET/KITS/TRAYS/PACK) ×4 IMPLANT
TRENDGUARD 600 HYBRID PROC PK (MISCELLANEOUS)

## 2017-07-14 NOTE — H&P (Signed)
Preoperative History and Physical  Shelly Silva is a 36 y.o. [id number] here for surgical management of AUB not responsive to medical therapy. Desires definitive management.  No significant preoperative concerns.  Proposed surgery:  Total vaginal hysterectomy (TVH) and prophylactic bilateral salpingectomy.  Past Medical History:  Diagnosis Date  . Abnormal Pap smear of cervix   . Asthma   . Headache(784.0)   . History of miscarriage   . HSV infection   . Infection    UTI  . S/P tubal ligation 11/04/2014   Past Surgical History:  Procedure Laterality Date  . DILATATION & CURETTAGE/HYSTEROSCOPY WITH TRUECLEAR  2003, 2006, 2006  . DILATION AND CURETTAGE OF UTERUS    . GYNECOLOGIC CRYOSURGERY  2007   OB History  Gravida Para Term Preterm AB Living  10 6 5 1 4 6   SAB TAB Ectopic Multiple Live Births  4     0 6    # Outcome Date GA Lbr Len/2nd Weight Sex Delivery Anes PTL Lv  10 Term 11/03/14 3418w1d 01:56 / 00:10 6 lb 7 oz (2.92 kg) F Vag-Spont EPI  LIV  9 SAB 11/13/13 1027w0d         8 Term 11/24/09 6639w3d  8 lb 15 oz (4.054 kg) M Vag-Spont EPI N LIV  7 Term 02/01/07 3112w2d  8 lb 6 oz (3.799 kg) F Vag-Spont EPI N LIV  6 Term 10/09/05 2239w3d  7 lb 8 oz (3.402 kg) M Vag-Spont EPI N LIV  5 SAB 06/2005             Birth Comments: had D&C  4 SAB 2006             Birth Comments: had D & C  3 Term 10/16/02 6247w0d  6 lb 5 oz (2.863 kg) F Vag-Spont EPI N LIV  2 SAB 2003        DEC     Birth Comments: had D & C  1 Preterm 10/03/00 4825w0d  2 lb (0.907 kg) F Vag-Spont None Y LIV    Patient denies any other pertinent gynecologic issues.   No current facility-administered medications on file prior to encounter.    Current Outpatient Medications on File Prior to Encounter  Medication Sig Dispense Refill  . acetaminophen (TYLENOL) 325 MG tablet Take 650 mg as needed by mouth.    Marland Kitchen. ibuprofen (ADVIL,MOTRIN) 200 MG tablet Take 400 mg by mouth every 8 (eight) hours as needed (for pain.).    Marland Kitchen.  megestrol (MEGACE) 20 MG tablet Take two tablets twice a day until bleeding stops then two tablets daily. (Patient taking differently: Take 40 mg by mouth 2 (two) times daily. ) 120 tablet 2  . ferrous sulfate (FERROUSUL) 325 (65 FE) MG tablet Take 1 tablet (325 mg total) by mouth 2 (two) times daily. (Patient not taking: Reported on 06/26/2017) 60 tablet 1  . fluconazole (DIFLUCAN) 150 MG tablet Take 1 tablet (150 mg total) by mouth every 3 (three) days. For three doses (Patient not taking: Reported on 06/26/2017) 3 tablet 3  . valACYclovir (VALTREX) 1000 MG tablet TAKE 1 TABLET BY MOUTH ONCE DAILY FOR 5 DAYS (Patient not taking: Reported on 06/26/2017) 5 tablet 3   No Known Allergies  Social History:   reports that she has been smoking cigarettes.  She has a 1.75 pack-year smoking history. she has never used smokeless tobacco. She reports that she drinks about 0.6 oz of alcohol per week. She reports that she does  not use drugs.  Family History  Problem Relation Age of Onset  . Diabetes Mother   . Breast cancer Paternal Aunt   . Hearing loss Neg Hx     Review of Systems: Pertinent items noted in HPI and remainder of comprehensive ROS otherwise negative.  PHYSICAL EXAM: Blood pressure (!) 116/91, pulse 78, temperature 98.6 F (37 C), temperature source Oral, resp. rate 20, SpO2 100 %. CONSTITUTIONAL: Well-developed, well-nourished female in no acute distress.  HENT:  Normocephalic, atraumatic, External right and left ear normal. Oropharynx is clear and moist EYES: Conjunctivae and EOM are normal. Pupils are equal, round, and reactive to light. No scleral icterus.  NECK: Normal range of motion, supple, no masses SKIN: Skin is warm and dry. No rash noted. Not diaphoretic. No erythema. No pallor. NEUROLOGIC: Alert and oriented to person, place, and time. Normal reflexes, muscle tone coordination. No cranial nerve deficit noted. PSYCHIATRIC: Normal mood and affect. Normal behavior. Normal  judgment and thought content. CARDIOVASCULAR: Normal heart rate noted, regular rhythm RESPIRATORY: Effort and breath sounds normal, no problems with respiration noted ABDOMEN: Soft, nontender, nondistended. PELVIC: Deferred MUSCULOSKELETAL: Normal range of motion. No edema and no tenderness. 2+ distal pulses.  Labs: Results for orders placed or performed during the hospital encounter of 07/14/17 (from the past 336 hour(s))  Pregnancy, urine   Collection Time: 07/14/17 10:57 AM  Result Value Ref Range   Preg Test, Ur NEGATIVE NEGATIVE  Results for orders placed or performed during the hospital encounter of 07/03/17 (from the past 336 hour(s))  CBC   Collection Time: 07/03/17 11:30 AM  Result Value Ref Range   WBC 6.0 4.0 - 10.5 K/uL   RBC 4.04 3.87 - 5.11 MIL/uL   Hemoglobin 13.4 12.0 - 15.0 g/dL   HCT 16.138.6 09.636.0 - 04.546.0 %   MCV 95.5 78.0 - 100.0 fL   MCH 33.2 26.0 - 34.0 pg   MCHC 34.7 30.0 - 36.0 g/dL   RDW 40.912.3 81.111.5 - 91.415.5 %   Platelets 306 150 - 400 K/uL  Type and screen   Collection Time: 07/03/17 11:30 AM  Result Value Ref Range   ABO/RH(D) A POS    Antibody Screen NEG    Sample Expiration 07/17/2017    Extend sample reason NO TRANSFUSIONS OR PREGNANCY IN THE PAST 3 MONTHS     Imaging Studies: 02/04/2016 TRANSVAGINAL ULTRASOUND PELVIS  CLINICAL DATA: 36 year old female with history of ovarian cyst presenting with right lower quadrant abdominal pain. COMPARISON: Ultrasound dated 10/09/2015 FINDINGS: Uterus  Measurements: 8.0 x 4.1 x 5.4 cm. No fibroids or other mass visualized. Endometrium Thickness: 7 mm. No focal abnormality visualized. Right ovary Measurements: 2.7 x 1.5.2 cm. Normal appearance/no adnexal mass. Left ovary Measurements: 3.2 x 2.72.7 cm. There is a 1.8 x 1.6 x 1.9 cm cyst in the left ovary. Other findings: Trace free fluid within the pelvis. IMPRESSION: Small left ovarian cyst otherwise unremarkable pelvic ultrasound.   Assessment: Principal  Problem:   Abnormal uterine bleeding (AUB) Active Problems:   ASCUS with positive high risk HPV cervical pap smear  Plan: Patient desires definitive management with total vaginal hysterectomy (TVH) and prophylactic bilateral salpingectomy.  No indication for oophorectomy.  Patient agrees with this proposed surgery.  The risks of surgery were discussed in detail with the patient including but not limited to: bleeding which may require transfusion or reoperation; infection which may require antibiotics; injury to bowel, bladder, ureters or other surrounding organs; need for additional procedures including laparotomy; thromboembolic  phenomenon, incisional problems and other postoperative/anesthesia complications.  Patient was also advised that she will remain in house for 1 night; and expected recovery time after a hysterectomy is 6-8 weeks.  Likelihood of success in alleviating the patient's symptoms was discussed.  Routine postoperative instructions will be reviewed with the patient and her family in detail after surgery.  The patient concurred with the proposed plan, giving informed written consent for the surgery.  Patient has been NPO since last night and she will remain NPO for procedure.  Anesthesia and OR aware.  Preoperative prophylactic antibiotics and SCDs ordered on call to the OR.  To OR when ready.  Jaynie Collins, M.D. 07/14/2017 12:55 PM

## 2017-07-14 NOTE — Anesthesia Procedure Notes (Signed)
Procedure Name: Intubation Date/Time: 07/14/2017 1:14 PM Performed by: Jonna Munro, CRNA Pre-anesthesia Checklist: Patient identified, Emergency Drugs available, Suction available, Patient being monitored and Timeout performed Patient Re-evaluated:Patient Re-evaluated prior to induction Oxygen Delivery Method: Circle system utilized Preoxygenation: Pre-oxygenation with 100% oxygen Induction Type: IV induction Ventilation: Mask ventilation without difficulty Laryngoscope Size: Mac and 3 Grade View: Grade II Tube type: Oral Tube size: 7.0 mm Number of attempts: 1 Airway Equipment and Method: Stylet Placement Confirmation: ETT inserted through vocal cords under direct vision,  positive ETCO2 and breath sounds checked- equal and bilateral Secured at: 22 cm Tube secured with: Tape Dental Injury: Teeth and Oropharynx as per pre-operative assessment

## 2017-07-14 NOTE — Anesthesia Preprocedure Evaluation (Signed)
Anesthesia Evaluation  Patient identified by MRN, date of birth, ID band Patient awake    Reviewed: Allergy & Precautions, H&P , NPO status , Patient's Chart, lab work & pertinent test results  Airway Mallampati: II  TM Distance: >3 FB Neck ROM: full    Dental  (+) Teeth Intact   Pulmonary asthma , Current Smoker,    Pulmonary exam normal breath sounds clear to auscultation       Cardiovascular negative cardio ROS Normal cardiovascular exam Rhythm:regular Rate:Normal     Neuro/Psych  Headaches, negative psych ROS   GI/Hepatic Neg liver ROS, GERD  ,  Endo/Other  Obesity  Renal/GU negative Renal ROS  negative genitourinary   Musculoskeletal negative musculoskeletal ROS (+)   Abdominal (+) + obese,   Peds  Hematology  (+) anemia ,   Anesthesia Other Findings       Reproductive/Obstetrics Desires permanent contraception                             Anesthesia Physical  Anesthesia Plan  ASA: II  Anesthesia Plan: General   Post-op Pain Management:    Induction: Intravenous  PONV Risk Score and Plan: 2 and Ondansetron and Midazolam  Airway Management Planned: Oral ETT  Additional Equipment:   Intra-op Plan:   Post-operative Plan:   Informed Consent: I have reviewed the patients History and Physical, chart, labs and discussed the procedure including the risks, benefits and alternatives for the proposed anesthesia with the patient or authorized representative who has indicated his/her understanding and acceptance.   Dental Advisory Given and Dental advisory given  Plan Discussed with: Anesthesiologist, CRNA and Surgeon  Anesthesia Plan Comments:         Anesthesia Quick Evaluation

## 2017-07-14 NOTE — Transfer of Care (Signed)
Immediate Anesthesia Transfer of Care Note  Patient: Shelly Silva  Procedure(s) Performed: HYSTERECTOMY VAGINAL (N/A Abdomen) BILATERAL SALPINGECTOMY (Bilateral Vagina )  Patient Location: PACU  Anesthesia Type:General  Level of Consciousness: awake, alert  and oriented  Airway & Oxygen Therapy: Patient Spontanous Breathing and Patient connected to nasal cannula oxygen  Post-op Assessment: Report given to RN and Post -op Vital signs reviewed and stable  Post vital signs: Reviewed and stable  Last Vitals:  Vitals:   07/14/17 1117  BP: (!) 116/91  Pulse: 78  Resp: 20  Temp: 37 C  SpO2: 100%    Last Pain:  Vitals:   07/14/17 1117  TempSrc: Oral      Patients Stated Pain Goal: 3 (07/14/17 1117)  Complications: No apparent anesthesia complications

## 2017-07-14 NOTE — Op Note (Signed)
Katy ApoMarkesha C Bonn PROCEDURE DATE: 07/14/2017  PREOPERATIVE DIAGNOSIS:  Abnormal uterine bleeding POSTOPERATIVE DIAGNOSIS:  Abnormal uterine bleeding SURGEON:   Jaynie CollinsUgonna Anyanwu, M.D. ASSISTANT:   Nicholaus BloomMyra Dove, M.D. OPERATION:  Total Vaginal Hysterectomy, Bilateral Salpingectomy. ANESTHESIA:  General  INDICATIONS: The patient is a  36 y.o. [id number]  with history of AUB who made a decision to undergo definite surgical treatment. On the day of surgery, the risks, benefits, indications, and alternatives of the procedure were reviewed with the patient including but not limited to: bleeding which may require transfusion or reoperation; infection which may require antibiotics; injury to bowel, bladder, ureters or other surrounding organs; need for additional procedures; thromboembolic phenomenon, incisional problems and other postoperative/anesthesia complications. Written informed consent was obtained.    OPERATIVE FINDINGS: A 8 week size uterus with tubes surgically ligated with Filshie clips. Normal ovaries bilaterally.  ESTIMATED BLOOD LOSS: 100 ml SPECIMENS:  Uterus, cervix and fallopian tubes sent to pathology COMPLICATIONS:  None immediate.  DESCRIPTION OF PROCEDURE:  The patient received intravenous antibiotics and had sequential compression devices applied to her lower extremities while in the preoperative area.  She was then taken to the operating room where general anesthesia was administered and was found to be adequate.  She was placed in the dorsal lithotomy position, and was prepped and draped in a sterile manner.  A Foley catheter was inserted into her bladder and attached to constant drainage. After an adequate timeout was performed, attention was turned to her pelvis.  A weighted speculum was then placed in the vagina, and the anterior and posterior lips of the cervix were grasped bilaterally with tenaculums.  The cervix was then injected circumferentially with 0.5% Marcaine with  epinephrine solution to maintain hemostasis.  The cervix was then circumferentially incised, and the bladder was dissected off the pubocervical fascia anteriorly without complication.  The anterior cul-de-sac was then entered sharply without difficulty and a retractor was placed.  The same procedure was performed posteriorly and the posterior cul-de-sac was entered sharply without difficulty.  A long weighted speculum was inserted into the posterior cul-de-sac.  The Heaney clamp was then used to clamp the uterosacral ligaments on either side.  They were then cut and sutured ligated with 0 Vicryl, and the ligated uterosacral ligaments were transfixed to the ipsilateral vaginal epithelium to further support the vagina and provide hemostasis. Of note, all sutures used in this case were 0 Vicryl unless otherwise noted.   The cardinal ligaments were then clamped, cut and ligated. The uterine vessels and broad ligaments were then serially clamped with the Heaney clamps, cut, and suture ligated on both sides.  Excellent hemostasis was noted at this point.  The uterus was then delivered via the posterior cul-de-sac, and the cornua were clamped with the Heaney clamps, transected, and the uterus was delivered and sent to pathology. These pedicles were then suture ligated to ensure hemostasis. Attention was turned to bilateral fallopian tubes which were clamped, cut and suture ligated allowing for bilateral salpingectomy.  After completion of the hysterectomy, all pedicles from the uterosacral ligament to the cornua were examined hemostasis was confirmed.  The vaginal cuff was then closed with a in a running locked fashion with care given to incorporate the uterosacral pedicles bilaterally.  All instruments were then removed from the pelvis.  The patient tolerated the procedure well.  All instruments, needles, and sponge counts were correct x 3. The patient was taken to the recovery room in stable condition.  Jaynie CollinsUGONNA   ANYANWU, MD, FACOG Attending Obstetrician & Gynecologist, [facility], [facility]

## 2017-07-14 NOTE — Anesthesia Postprocedure Evaluation (Signed)
Anesthesia Post Note  Patient: Katy ApoMarkesha C Berntsen  Procedure(s) Performed: HYSTERECTOMY VAGINAL (N/A Abdomen) BILATERAL SALPINGECTOMY (Bilateral Vagina )     Patient location during evaluation: PACU Anesthesia Type: General Level of consciousness: awake and alert Pain management: pain level controlled Vital Signs Assessment: post-procedure vital signs reviewed and stable Respiratory status: spontaneous breathing, nonlabored ventilation and respiratory function stable Cardiovascular status: blood pressure returned to baseline and stable Postop Assessment: no apparent nausea or vomiting Anesthetic complications: no    Last Vitals:  Vitals:   07/14/17 1530 07/14/17 1555  BP: 134/78 127/77  Pulse: 69 65  Resp: 13 16  Temp:  36.9 C  SpO2: 100% 99%    Last Pain:  Vitals:   07/14/17 1555  TempSrc:   PainSc: 3    Pain Goal: Patients Stated Pain Goal: 4 (07/14/17 1555)               Lowella CurbWarren Ray Miller

## 2017-07-15 ENCOUNTER — Encounter (HOSPITAL_COMMUNITY): Payer: Self-pay | Admitting: Obstetrics & Gynecology

## 2017-07-15 DIAGNOSIS — N939 Abnormal uterine and vaginal bleeding, unspecified: Secondary | ICD-10-CM | POA: Diagnosis not present

## 2017-07-15 LAB — CBC
HCT: 36.5 % (ref 36.0–46.0)
Hemoglobin: 12.5 g/dL (ref 12.0–15.0)
MCH: 32.8 pg (ref 26.0–34.0)
MCHC: 34.2 g/dL (ref 30.0–36.0)
MCV: 95.8 fL (ref 78.0–100.0)
PLATELETS: 340 10*3/uL (ref 150–400)
RBC: 3.81 MIL/uL — ABNORMAL LOW (ref 3.87–5.11)
RDW: 11.9 % (ref 11.5–15.5)
WBC: 10.7 10*3/uL — AB (ref 4.0–10.5)

## 2017-07-15 MED ORDER — DOCUSATE SODIUM 100 MG PO CAPS: 100.0000 mg | ORAL_CAPSULE | Freq: Two times a day (BID) | ORAL | 2 refills | Status: DC

## 2017-07-15 MED ORDER — OXYCODONE-ACETAMINOPHEN 5-325 MG PO TABS: 1.0000 | ORAL_TABLET | Freq: Four times a day (QID) | ORAL | 0 refills | Status: DC | PRN

## 2017-07-15 MED ORDER — IBUPROFEN 600 MG PO TABS
600.0000 mg | ORAL_TABLET | Freq: Four times a day (QID) | ORAL | 2 refills | Status: DC | PRN
Start: 2017-07-15 — End: 2019-11-15

## 2017-07-15 MED ORDER — SIMETHICONE 80 MG PO CHEW: 80.0000 mg | CHEWABLE_TABLET | Freq: Four times a day (QID) | ORAL | 0 refills | Status: DC | PRN

## 2017-07-15 NOTE — Progress Notes (Signed)
Discharge instructions reviewed with patient.  Instructed patient regarding postoperative infection. Follow up appointments and medication changes discussed.  Patient verbalized understanding and signed paperwork.

## 2017-07-15 NOTE — Discharge Instructions (Signed)
Vaginal Hysterectomy, Care After °Refer to this sheet in the next few weeks. These instructions provide you with information about caring for yourself after your procedure. Your health care provider may also give you more specific instructions. Your treatment has been planned according to current medical practices, but problems sometimes occur. Call your health care provider if you have any problems or questions after your procedure. °What can I expect after the procedure? °After the procedure, it is common to have: °· Pain. °· Soreness and numbness in your incision areas. °· Vaginal bleeding and discharge. °· Constipation. °· Temporary problems emptying the bladder. °· Feelings of sadness or other emotions. ° °Follow these instructions at home: °Medicines °· Take over-the-counter and prescription medicines only as told by your health care provider. °· If you were prescribed an antibiotic medicine, take it as told by your health care provider. Do not stop taking the antibiotic even if you start to feel better. °· Do not drive or operate heavy machinery while taking prescription pain medicine. °Activity °· Return to your normal activities as told by your health care provider. Ask your health care provider what activities are safe for you. °· Get regular exercise as told by your health care provider. You may be told to take short walks every day and go farther each time. °· Do not lift anything that is heavier than 10 lb (4.5 kg). °General instructions ° °· Do not put anything in your vagina for 6 weeks after your surgery or as told by your health care provider. This includes tampons and douches. °· Do not have sex until your health care provider says you can. °· Do not take baths, swim, or use a hot tub until your health care provider approves. °· Drink enough fluid to keep your urine clear or pale yellow. °· Do not drive for 24 hours if you were given a sedative. °· Keep all follow-up visits as told by your health  care provider. This is important. °Contact a health care provider if: °· Your pain medicine is not helping. °· You have a fever. °· You have redness, swelling, or pain at your incision site. °· You have blood, pus, or a bad-smelling discharge from your vagina. °· You continue to have difficulty urinating. °Get help right away if: °· You have severe abdominal or back pain. °· You have heavy bleeding from your vagina. °· You have chest pain or shortness of breath. °This information is not intended to replace advice given to you by your health care provider. Make sure you discuss any questions you have with your health care provider. °Document Released: 12/10/2015 Document Revised: 01/24/2016 Document Reviewed: 09/02/2015 °Elsevier Interactive Patient Education © 2018 [employer] ° °

## 2017-07-15 NOTE — Discharge Summary (Signed)
Gynecology Physician Postoperative Discharge Summary  Patient ID: Shelly Silva MRN: [id number] DOB/AGE: Aug 04, 1981 36 y.o.  Admit Date: 07/14/2017 Discharge Date: 07/15/2017  Preoperative Diagnoses: Abnormal uterine bleeding  Procedures: Procedure(s) (LRB): HYSTERECTOMY VAGINAL (N/A) BILATERAL SALPINGECTOMY (Bilateral)  Hospital Course:  Shelly Silva is a 36 y.o. [id number] admitted for scheduled surgery.  She underwent the procedures as mentioned above, her operation was uncomplicated. For further details about surgery, please refer to the operative report. Patient had an uncomplicated postoperative course.  By time of discharge on POD#1, her pain was controlled on oral pain medications; she was ambulating, voiding without difficulty, tolerating regular diet and passing flatus. She was deemed stable for discharge to home.   Significant Labs: CBC Latest Ref Rng & Units 07/15/2017 07/03/2017 03/05/2017  WBC 4.0 - 10.5 K/uL 10.7(H) 6.0 5.7  Hemoglobin 12.0 - 15.0 g/dL 47.812.5 29.513.4 62.115.3  Hematocrit 36.0 - 46.0 % 36.5 38.6 46.2  Platelets 150 - 400 K/uL 340 306 227    Discharge Exam: Blood pressure 117/77, pulse 61, temperature 98.4 F (36.9 C), temperature source Oral, resp. rate 18, height 5\' 2"  (1.575 m), weight 160 lb (72.6 kg), SpO2 99 %. General appearance: alert and no distress  Resp: clear to auscultation bilaterally  Cardio: regular rate and rhythm  GI: soft, non-tender; bowel sounds normal; no masses, no organomegaly.  Pelvic: no blood on pad  Extremities: extremities normal, atraumatic, no cyanosis or edema and Homans sign is negative, no sign of DVT  Discharged Condition: Stable  Disposition: 01-Home or Self Care   Allergies as of 07/15/2017   No Known Allergies     Medication List    STOP taking these medications   acetaminophen 325 MG tablet Commonly known as:  TYLENOL   fluconazole 150 MG tablet Commonly known as:  DIFLUCAN   valACYclovir 1000 MG  tablet Commonly known as:  VALTREX     TAKE these medications   docusate sodium 100 MG capsule Commonly known as:  COLACE Take 1 capsule (100 mg total) 2 (two) times daily by mouth.   ferrous sulfate 325 (65 FE) MG tablet Commonly known as:  FERROUSUL Take 1 tablet (325 mg total) by mouth 2 (two) times daily.   ibuprofen 600 MG tablet Commonly known as:  ADVIL,MOTRIN Take 1 tablet (600 mg total) every 6 (six) hours as needed by mouth for moderate pain or cramping (mild pain). What changed:    medication strength  how much to take  when to take this  reasons to take this   oxyCODONE-acetaminophen 5-325 MG tablet Commonly known as:  PERCOCET/ROXICET Take 1-2 tablets every 6 (six) hours as needed by mouth for severe pain (moderate to severe pain (when tolerating fluids)).   simethicone 80 MG chewable tablet Commonly known as:  MYLICON Chew 1 tablet (80 mg total) every 6 (six) hours as needed by mouth (gas pain).      Follow-up Information    Kimbery Harwood, Jethro BastosUgonna A, MD Follow up on 08/19/2017.   Specialty:  Obstetrics and Gynecology Why:  9:20 am for postoperative appointment Contact information: [address] [place] [address] [phone]           Signed:  Jaynie CollinsUGONNA  Zyon Rosser, MD, FACOG Attending Obstetrician & Gynecologist Faculty Practice, [facility]

## 2017-07-20 ENCOUNTER — Telehealth: Payer: Self-pay

## 2017-07-20 NOTE — Telephone Encounter (Signed)
-----   Message from Tereso NewcomerUgonna A Anyanwu, MD sent at 07/16/2017  5:07 PM EST ----- 07/14/17 Surgical Pathology Diagnosis Uterus, cervix and bilateral fallopian tubes UTERUS: - INACTIVE ENDOMETRIUM WITH EXOGENOUS HORMONAL EFFECT - NO HYPERPLASIA OR MALIGNANCY IDENTIFIED CERVIX: - NO DIAGNOSTIC ABNORMALITY - NO DYSPLASIA OR MALIGNANCY IDENTIFIED BILATERAL FALLOPIAN TUBES: - CHRONIC SALPINGITIS - NO MALIGNANCY IDENTIFIED DAWN BUTLER MD Pathologist, Electronic Signature (Case signed 07/16/2017)  Benign surgical pathology.  Please call and inform patient of results.  Thank you!

## 2017-07-20 NOTE — Telephone Encounter (Signed)
Notified pt of benign pathology results.  Pt stated thank you with no further questions.

## 2017-08-19 ENCOUNTER — Ambulatory Visit: Payer: No Typology Code available for payment source | Admitting: Obstetrics & Gynecology

## 2017-08-19 ENCOUNTER — Encounter: Payer: Self-pay | Admitting: Obstetrics & Gynecology

## 2017-08-19 ENCOUNTER — Ambulatory Visit (INDEPENDENT_AMBULATORY_CARE_PROVIDER_SITE_OTHER): Payer: Self-pay | Admitting: Obstetrics & Gynecology

## 2017-08-19 VITALS — BP 119/92 | HR 94 | Ht 62.0 in | Wt 163.2 lb

## 2017-08-19 DIAGNOSIS — N898 Other specified noninflammatory disorders of vagina: Secondary | ICD-10-CM

## 2017-08-19 DIAGNOSIS — Z113 Encounter for screening for infections with a predominantly sexual mode of transmission: Secondary | ICD-10-CM

## 2017-08-19 DIAGNOSIS — Z09 Encounter for follow-up examination after completed treatment for conditions other than malignant neoplasm: Secondary | ICD-10-CM

## 2017-08-19 DIAGNOSIS — Z9889 Other specified postprocedural states: Secondary | ICD-10-CM

## 2017-08-19 DIAGNOSIS — Z90710 Acquired absence of both cervix and uterus: Secondary | ICD-10-CM

## 2017-08-19 MED ORDER — METRONIDAZOLE 500 MG PO TABS: 500.0000 mg | ORAL_TABLET | Freq: Two times a day (BID) | ORAL | 2 refills | Status: DC

## 2017-08-19 NOTE — Progress Notes (Signed)
Subjective:     Shelly Silva is a 36 y.o. female who presents to the clinic 4 weeks status post TVH, BS for abnormal uterine bleeding. Eating a regular diet without difficulty. Bowel movements are normal. The patient is not having any pain. Patient reports having malodorous copious yellow discharge, concerned about infection.  The following portions of the patient's history were reviewed and updated as appropriate: allergies, current medications, past family history, past medical history, past social history, past surgical history and problem list.  Review of Systems Pertinent items noted in HPI and remainder of comprehensive ROS otherwise negative.    Objective:    BP (!) 119/92   Pulse 94   Ht 5\' 2"  (1.575 m)   Wt 163 lb 3.2 oz (74 kg)   BMI 29.85 kg/m  General:  alert and no distress  Abdomen: soft, bowel sounds active, non-tender  Pelvic:  Healing intact vaginal cuff, some sutures visible, no erythema, has copious amount of yellow, malodorous discharge, testing sample obtained.       07/14/17 Surgical Pathology Diagnosis Uterus, cervix and bilateral fallopian tubes UTERUS: - INACTIVE ENDOMETRIUM WITH EXOGENOUS HORMONAL EFFECT - NO HYPERPLASIA OR MALIGNANCY IDENTIFIED CERVIX: - NO DIAGNOSTIC ABNORMALITY - NO DYSPLASIA OR MALIGNANCY IDENTIFIED BILATERAL FALLOPIAN TUBES: - CHRONIC SALPINGITIS - NO MALIGNANCY IDENTIFIED  Assessment:    Doing well postoperatively. Operative findings again reviewed. Pathology report discussed.    Plan:   1. Continue any current medications. 2. Metronidazole prescribed for presumed BV, will follow up test 3. Activity restrictions: pelvic rest for four more weeks 4. Anticipated return to work: 1-2 weeks. 5. Follow up as needed.   Jaynie CollinsUGONNA  Fitzhugh Vizcarrondo, MD, FACOG Obstetrician & Gynecologist, [facility], [facility]

## 2017-08-20 LAB — CERVICOVAGINAL ANCILLARY ONLY
BACTERIAL VAGINITIS: POSITIVE — AB
CANDIDA VAGINITIS: NEGATIVE
CHLAMYDIA, DNA PROBE: NEGATIVE
Neisseria Gonorrhea: NEGATIVE
TRICH (WINDOWPATH): NEGATIVE

## 2017-12-04 ENCOUNTER — Other Ambulatory Visit: Payer: Self-pay

## 2017-12-04 ENCOUNTER — Encounter (HOSPITAL_COMMUNITY): Payer: Self-pay | Admitting: *Deleted

## 2017-12-04 ENCOUNTER — Emergency Department (HOSPITAL_COMMUNITY)
Admission: EM | Admit: 2017-12-04 | Discharge: 2017-12-04 | Disposition: A | Discharge status: Home / Self Care | Payer: Self-pay | Attending: Emergency Medicine | Admitting: Emergency Medicine

## 2017-12-04 DIAGNOSIS — H538 Other visual disturbances: Secondary | ICD-10-CM | POA: Insufficient documentation

## 2017-12-04 DIAGNOSIS — F17210 Nicotine dependence, cigarettes, uncomplicated: Secondary | ICD-10-CM | POA: Insufficient documentation

## 2017-12-04 DIAGNOSIS — J45909 Unspecified asthma, uncomplicated: Secondary | ICD-10-CM | POA: Insufficient documentation

## 2017-12-04 DIAGNOSIS — Z79899 Other long term (current) drug therapy: Secondary | ICD-10-CM | POA: Insufficient documentation

## 2017-12-04 DIAGNOSIS — R51 Headache: Secondary | ICD-10-CM | POA: Insufficient documentation

## 2017-12-04 DIAGNOSIS — R519 Headache, unspecified: Secondary | ICD-10-CM

## 2017-12-04 LAB — BASIC METABOLIC PANEL
ANION GAP: 9 (ref 5–15)
BUN: 7 mg/dL (ref 6–20)
CHLORIDE: 107 mmol/L (ref 101–111)
CO2: 22 mmol/L (ref 22–32)
Calcium: 9 mg/dL (ref 8.9–10.3)
Creatinine, Ser: 0.64 mg/dL (ref 0.44–1.00)
GFR calc Af Amer: >60 mL/min (ref >60)
GFR calc non Af Amer: >60 mL/min (ref >60)
GLUCOSE: 124 mg/dL — AB (ref 65–99)
POTASSIUM: 3.8 mmol/L (ref 3.5–5.1)
Sodium: 138 mmol/L (ref 135–145)

## 2017-12-04 LAB — CBC WITH DIFFERENTIAL/PLATELET
Basophils Absolute: 0 10*3/uL (ref 0.0–0.1)
Basophils Relative: 1 %
Eosinophils Absolute: 0.2 10*3/uL (ref 0.0–0.7)
Eosinophils Relative: 2 %
HEMATOCRIT: 39.6 % (ref 36.0–46.0)
HEMOGLOBIN: 13.2 g/dL (ref 12.0–15.0)
LYMPHS PCT: 40 %
Lymphs Abs: 3 10*3/uL (ref 0.7–4.0)
MCH: 30.6 pg (ref 26.0–34.0)
MCHC: 33.3 g/dL (ref 30.0–36.0)
MCV: 91.9 fL (ref 78.0–100.0)
Monocytes Absolute: 0.5 10*3/uL (ref 0.1–1.0)
Monocytes Relative: 7 %
NEUTROS ABS: 3.8 10*3/uL (ref 1.7–7.7)
NEUTROS PCT: 50 %
Platelets: 295 10*3/uL (ref 150–400)
RBC: 4.31 MIL/uL (ref 3.87–5.11)
RDW: 12.5 % (ref 11.5–15.5)
WBC: 7.5 10*3/uL (ref 4.0–10.5)

## 2017-12-04 LAB — I-STAT BETA HCG BLOOD, ED (MC, WL, AP ONLY): I-stat hCG, quantitative: <5 m[IU]/mL (ref <5)

## 2017-12-04 MED ORDER — DIPHENHYDRAMINE HCL 50 MG/ML IJ SOLN
25.0000 mg | Freq: Once | INTRAMUSCULAR | Status: AC
  Administered 2017-12-04: 25 mg via INTRAVENOUS
  Filled 2017-12-04: qty 1

## 2017-12-04 MED ORDER — ACETAMINOPHEN 500 MG PO TABS
1000.0000 mg | ORAL_TABLET | Freq: Once | ORAL | Status: AC
  Administered 2017-12-04: 1000 mg via ORAL
  Filled 2017-12-04: qty 2

## 2017-12-04 MED ORDER — ASPIRIN-ACETAMINOPHEN-CAFFEINE 250-250-65 MG PO TABS: 1.0000 | ORAL_TABLET | Freq: Four times a day (QID) | ORAL | 0 refills | Status: AC | PRN

## 2017-12-04 MED ORDER — SODIUM CHLORIDE 0.9 % IV BOLUS
500.0000 mL | Freq: Once | INTRAVENOUS | Status: AC
  Administered 2017-12-04: 500 mL via INTRAVENOUS

## 2017-12-04 MED ORDER — METOCLOPRAMIDE HCL 5 MG/ML IJ SOLN
10.0000 mg | Freq: Once | INTRAMUSCULAR | Status: AC
  Administered 2017-12-04: 10 mg via INTRAVENOUS
  Filled 2017-12-04: qty 2

## 2017-12-04 NOTE — ED Provider Notes (Signed)
Patient placed in Quick Look pathway, seen and evaluated   Chief Complaint: headache  HPI:   Patient presents with 2 days of throbbing headache that started on the left and now moved to the right.  Reports a history of the same in the past but usually resolves quicker.  She has taken ibuprofen 6 hours ago without relief of his symptoms.  She reports seeing black spots prior to headaches onset on the left side.  Reports a history of migraines but has not had an official diagnosis is never seen a neurologist.   ROS: Denies numbness, weakness, dizziness, blurred vision, nausea, vomiting.  Physical Exam:   Gen: No distress  Neuro: Awake and Alert  Skin: Warm    Focused Exam: 5/5 strength in upper and lower extremities bilaterally, normal stance and gait normal balance.  No cranial nerve deficits.   Initiation of care has begun. The patient has been counseled on the process, plan, and necessity for staying for the completion/evaluation, and the remainder of the medical screening examination    Gregary CromerMitchell, Rube Sanchez B, PA-C 12/04/17 1343    Tegeler, Canary Brimhristopher J, MD 12/04/17 [phone]

## 2017-12-04 NOTE — ED Provider Notes (Signed)
Patient wanted to leave from the waiting room as she significantly improved after tylenol and has to go to work in 1/2 hour. Patient was brought back to a triage room for reassessment and discharge.  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 I explained to her that we can write her a note for work and that if she has not improved she should be seen or will be leaving AMA. Reviewed patients labs. Labs unremarkable. Normal neuro.     Patient opted to stay for treatment.    Georgiana ShoreMitchell, Jessica B, PA-C 12/04/17 1540    Tegeler, Canary Brimhristopher J, MD 12/04/17 [phone]

## 2017-12-04 NOTE — Discharge Instructions (Signed)
As discussed, make sure that you stay well-hydrated and follow-up with your primary care provider.  Drink enough fluids to keep your urine clear. Get some rest.  Return if symptoms worsen, persistent headache, nausea, vomiting, visual changes, dizziness, loss of balance or other new concerning symptoms in the meantime.

## 2017-12-04 NOTE — ED Triage Notes (Signed)
Pt in c/o HA onset x 2 days with hx of the same, denies aura, denies n/v/d, no slurred speech, no facial droop, pt A&O x4

## 2017-12-04 NOTE — ED Provider Notes (Signed)
[facility] EMERGENCY DEPARTMENT Provider Note   CSN: [id number] Arrival date & time: 12/04/17  1311     History   Chief Complaint No chief complaint on file.   HPI Shelly Silva is a 37 y.o. female with past medical history of headaches, asthma presenting with 2 days of persistent throbbing headache which started on the left and moved to the right.  She has a history of similar headaches which typically resolve faster but this time it did not improve after ibuprofen.  She last took ibuprofen 6 hours ago without relief.  No fall injury or trauma.  HPI  Past Medical History:  Diagnosis Date  . Abnormal Pap smear of cervix   . Asthma   . Headache(784.0)   . History of miscarriage   . HSV infection   . Infection    UTI  . S/P tubal ligation 11/04/2014    Patient Active Problem List   Diagnosis Date Noted  . S/P TVH, BS on 07/16/17 for AUB 07/14/2017    Past Surgical History:  Procedure Laterality Date  . BILATERAL SALPINGECTOMY Bilateral 07/14/2017   Procedure: BILATERAL SALPINGECTOMY;  Surgeon: Tereso Newcomer, MD;  Location: [facility];  Service: Gynecology;  Laterality: Bilateral;  . DILATATION & CURETTAGE/HYSTEROSCOPY WITH TRUECLEAR  2003, 2006, 2006  . DILATION AND CURETTAGE OF UTERUS    . GYNECOLOGIC CRYOSURGERY  2007  . TUBAL LIGATION N/A 11/03/2014   Procedure: POST PARTUM TUBAL LIGATION;  Surgeon: Adam Phenix, MD;  Location: [facility];  Service: Gynecology;  Laterality: N/A;  . VAGINAL HYSTERECTOMY N/A 07/14/2017   Procedure: HYSTERECTOMY VAGINAL;  Surgeon: Tereso Newcomer, MD;  Location: [facility];  Service: Gynecology;  Laterality: N/A;     OB History    Gravida  10   Para  6   Term  5   Preterm  1   AB  4   Living  6     SAB  4   TAB      Ectopic      Multiple  0   Live Births  6            Home Medications    Prior to Admission medications   Medication Sig Start Date End Date Taking? Authorizing Provider    aspirin-acetaminophen-caffeine (EXCEDRIN MIGRAINE) [phone] MG tablet Take 1 tablet by mouth every 6 (six) hours as needed for headache. 12/04/17   Mathews Robinsons B, PA-C  docusate sodium (COLACE) 100 MG capsule Take 1 capsule (100 mg total) 2 (two) times daily by mouth. 07/15/17   Anyanwu, Jethro Bastos, MD  ferrous sulfate (FERROUSUL) 325 (65 FE) MG tablet Take 1 tablet (325 mg total) by mouth 2 (two) times daily. 03/05/17   Anyanwu, Jethro Bastos, MD  ibuprofen (ADVIL,MOTRIN) 600 MG tablet Take 1 tablet (600 mg total) every 6 (six) hours as needed by mouth for moderate pain or cramping (mild pain). 07/15/17   Anyanwu, Jethro Bastos, MD  metroNIDAZOLE (FLAGYL) 500 MG tablet Take 1 tablet (500 mg total) by mouth 2 (two) times daily. 08/19/17   Tereso Newcomer, MD    Family History Family History  Problem Relation Age of Onset  . Diabetes Mother   . Breast cancer Paternal Aunt   . Hearing loss Neg Hx     Social History Social History   Tobacco Use  . Smoking status: Current Some Day Smoker    Packs/day: 0.25    Years: 7.00  Pack years: 1.75    Types: Cigarettes  . Smokeless tobacco: Never Used  . Tobacco comment: off and on  Substance Use Topics  . Alcohol use: Yes    Alcohol/week: 0.6 oz    Types: 1 Shots of liquor per week    Comment: occ  . Drug use: No     Allergies   Patient has no known allergies.   Review of Systems Review of Systems  Constitutional: Negative for chills, diaphoresis, fatigue and fever.  HENT: Negative for congestion.   Eyes: Negative for photophobia, pain, redness and visual disturbance.  Respiratory: Negative for cough, choking, chest tightness, shortness of breath, wheezing and stridor.   Cardiovascular: Negative for chest pain and palpitations.  Gastrointestinal: Negative for abdominal pain, nausea and vomiting.  Genitourinary: Negative for difficulty urinating.  Musculoskeletal: Negative for myalgias, neck pain and neck stiffness.  Skin: Negative  for color change, pallor and rash.  Neurological: Positive for headaches. Negative for dizziness, seizures, syncope, facial asymmetry, weakness, light-headedness and numbness.     Physical Exam Updated Vital Signs BP (!) 135/93 (BP Location: Right Arm)   Pulse 96   Temp 99.5 F (37.5 C) (Oral)   Resp 15   Ht 5\' 2"  (1.575 m)   Wt 74.8 kg (165 lb)   SpO2 100%   BMI 30.18 kg/m   Physical Exam  Constitutional: She is oriented to person, place, and time. She appears well-developed and well-nourished. No distress.  Afebrile, nontoxic-appearing, sitting comfortably in chair in no acute distress.  HENT:  Head: Normocephalic and atraumatic.  Eyes: Pupils are equal, round, and reactive to light. Conjunctivae and EOM are normal. Right eye exhibits no discharge. Left eye exhibits no discharge.  Neck: Normal range of motion. Neck supple.  No meningeal signs  Cardiovascular: Normal rate, regular rhythm, normal heart sounds and intact distal pulses.  No murmur heard. Pulmonary/Chest: Effort normal and breath sounds normal. No stridor. No respiratory distress. She has no wheezes. She has no rales.  Abdominal: She exhibits no distension.  Musculoskeletal: Normal range of motion. She exhibits no edema, tenderness or deformity.  Neurological: She is alert and oriented to person, place, and time. No cranial nerve deficit or sensory deficit. She exhibits normal muscle tone. Coordination normal.  Patient with normal stance and gait and balance. No cranial nerve deficits.  5/5 strength to upper and lower extremities bilaterally.  Skin: Skin is warm and dry. No rash noted. She is not diaphoretic. No erythema. No pallor.  Psychiatric: She has a normal mood and affect.  Nursing note and vitals reviewed.    ED Treatments / Results  Labs (all labs ordered are listed, but only abnormal results are displayed) Labs Reviewed  BASIC METABOLIC PANEL - Abnormal; Notable for the following components:       Result Value   Glucose, Bld 124 (*)    All other components within normal limits  CBC WITH DIFFERENTIAL/PLATELET  I-STAT BETA HCG BLOOD, ED ([facility], [facility], [facility] ONLY)    EKG None  Radiology No results found.  Procedures Procedures (including critical care time)  Medications Ordered in ED Medications  acetaminophen (TYLENOL) tablet 1,000 mg (1,000 mg Oral Given 12/04/17 1414)  sodium chloride 0.9 % bolus 500 mL (500 mLs Intravenous New Bag/Given 12/04/17 1627)  diphenhydrAMINE (BENADRYL) injection 25 mg (25 mg Intravenous Given 12/04/17 1625)  metoCLOPramide (REGLAN) injection 10 mg (10 mg Intravenous Given 12/04/17 1624)     Initial Impression / Assessment and Plan / ED Course  I have reviewed the triage vital signs and the nursing notes.  Pertinent labs & imaging results that were available during my care of the patient were reviewed by me and considered in my medical decision making (see chart for details).    Patient presents with a throbbing headache for the past 2 days.  No nausea vomiting, visual disturbances, dizziness, loss of balance, weakness or numbness.  Normal neuro exam, afebrile, nontoxic and no meningeal signs.  Patient was given a migraine cocktail and reported significant improvement, she was sleeping comfortably on reassessment.  Discharge home with symptomatic relief and close follow-up with PCP.  Discussed strict return precautions and advised to return to the emergency department if experiencing any new or worsening symptoms. Instructions were understood and patient agreed with discharge plan.  Final Clinical Impressions(s) / ED Diagnoses   Final diagnoses:  Acute nonintractable headache, unspecified headache type    ED Discharge Orders        Ordered    aspirin-acetaminophen-caffeine (EXCEDRIN MIGRAINE) 250-250-65 MG tablet  Every 6 hours PRN     12/04/17 1729       Georgiana Shore, PA-C 12/04/17 1731    Tegeler, Canary Brim, MD 12/04/17 [phone]

## 2018-09-03 ENCOUNTER — Emergency Department (HOSPITAL_COMMUNITY)
Admission: EM | Admit: 2018-09-03 | Discharge: 2018-09-03 | Disposition: A | Discharge status: Home / Self Care | Payer: Self-pay | Attending: Emergency Medicine | Admitting: Emergency Medicine

## 2018-09-03 DIAGNOSIS — H9201 Otalgia, right ear: Secondary | ICD-10-CM | POA: Insufficient documentation

## 2018-09-03 DIAGNOSIS — Z79899 Other long term (current) drug therapy: Secondary | ICD-10-CM | POA: Insufficient documentation

## 2018-09-03 DIAGNOSIS — J3489 Other specified disorders of nose and nasal sinuses: Secondary | ICD-10-CM | POA: Insufficient documentation

## 2018-09-03 DIAGNOSIS — J029 Acute pharyngitis, unspecified: Secondary | ICD-10-CM | POA: Insufficient documentation

## 2018-09-03 DIAGNOSIS — R197 Diarrhea, unspecified: Secondary | ICD-10-CM | POA: Insufficient documentation

## 2018-09-03 DIAGNOSIS — R05 Cough: Secondary | ICD-10-CM | POA: Insufficient documentation

## 2018-09-03 DIAGNOSIS — J45909 Unspecified asthma, uncomplicated: Secondary | ICD-10-CM | POA: Insufficient documentation

## 2018-09-03 DIAGNOSIS — M791 Myalgia, unspecified site: Secondary | ICD-10-CM | POA: Insufficient documentation

## 2018-09-03 DIAGNOSIS — R0981 Nasal congestion: Secondary | ICD-10-CM | POA: Insufficient documentation

## 2018-09-03 DIAGNOSIS — F17210 Nicotine dependence, cigarettes, uncomplicated: Secondary | ICD-10-CM | POA: Insufficient documentation

## 2018-09-03 LAB — GROUP A STREP BY PCR: Group A Strep by PCR: NOT DETECTED

## 2018-09-03 NOTE — ED Provider Notes (Signed)
[facility] EMERGENCY DEPARTMENT Provider Note   CSN: [id number] Arrival date & time: 09/03/18  0935     History   Chief Complaint No chief complaint on file.   HPI Shelly Silva is a 38 y.o. female who presents today for evaluation of right sided ear and throat pain for two days.  She reports that her children were sick but did not get tested.  Her whole body aches.  No N/V.  She had one episode of watery diarrhea about 30 minutes ago.    No fevers.  No flu shot this year.  She had ibuprofen about an hour ago.    HPI  Past Medical History:  Diagnosis Date  . Abnormal Pap smear of cervix   . Asthma   . Headache(784.0)   . History of miscarriage   . HSV infection   . Infection    UTI  . S/P tubal ligation 11/04/2014    Patient Active Problem List   Diagnosis Date Noted  . S/P TVH, BS on 07/16/17 for AUB 07/14/2017    Past Surgical History:  Procedure Laterality Date  . BILATERAL SALPINGECTOMY Bilateral 07/14/2017   Procedure: BILATERAL SALPINGECTOMY;  Surgeon: Tereso Newcomer, MD;  Location: [facility];  Service: Gynecology;  Laterality: Bilateral;  . DILATATION & CURETTAGE/HYSTEROSCOPY WITH TRUECLEAR  2003, 2006, 2006  . DILATION AND CURETTAGE OF UTERUS    . GYNECOLOGIC CRYOSURGERY  2007  . TUBAL LIGATION N/A 11/03/2014   Procedure: POST PARTUM TUBAL LIGATION;  Surgeon: Adam Phenix, MD;  Location: [facility];  Service: Gynecology;  Laterality: N/A;  . VAGINAL HYSTERECTOMY N/A 07/14/2017   Procedure: HYSTERECTOMY VAGINAL;  Surgeon: Tereso Newcomer, MD;  Location: [facility];  Service: Gynecology;  Laterality: N/A;     OB History    Gravida  10   Para  6   Term  5   Preterm  1   AB  4   Living  6     SAB  4   TAB      Ectopic      Multiple  0   Live Births  6            Home Medications    Prior to Admission medications   Medication Sig Start Date End Date Taking? Authorizing Provider  aspirin-acetaminophen-caffeine  (EXCEDRIN MIGRAINE) [phone] MG tablet Take 1 tablet by mouth every 6 (six) hours as needed for headache. 12/04/17   Mathews Robinsons B, PA-C  docusate sodium (COLACE) 100 MG capsule Take 1 capsule (100 mg total) 2 (two) times daily by mouth. 07/15/17   Anyanwu, Jethro Bastos, MD  ferrous sulfate (FERROUSUL) 325 (65 FE) MG tablet Take 1 tablet (325 mg total) by mouth 2 (two) times daily. 03/05/17   Anyanwu, Jethro Bastos, MD  ibuprofen (ADVIL,MOTRIN) 600 MG tablet Take 1 tablet (600 mg total) every 6 (six) hours as needed by mouth for moderate pain or cramping (mild pain). 07/15/17   Anyanwu, Jethro Bastos, MD  metroNIDAZOLE (FLAGYL) 500 MG tablet Take 1 tablet (500 mg total) by mouth 2 (two) times daily. 08/19/17   Tereso Newcomer, MD    Family History Family History  Problem Relation Age of Onset  . Diabetes Mother   . Breast cancer Paternal Aunt   . Hearing loss Neg Hx     Social History Social History   Tobacco Use  . Smoking status: Current Some Day Smoker    Packs/day: 0.25  Years: 7.00    Pack years: 1.75    Types: Cigarettes  . Smokeless tobacco: Never Used  . Tobacco comment: off and on  Substance Use Topics  . Alcohol use: Yes    Alcohol/week: 1.0 standard drinks    Types: 1 Shots of liquor per week    Comment: occ  . Drug use: No     Allergies   Patient has no known allergies.   Review of Systems Review of Systems  Constitutional: Negative for chills and fever.  HENT: Positive for congestion, ear pain, postnasal drip, rhinorrhea and sore throat. Negative for sinus pressure, sinus pain, trouble swallowing and voice change.   Respiratory: Positive for cough. Negative for chest tightness and shortness of breath.   Cardiovascular: Negative for chest pain and palpitations.  Gastrointestinal: Negative for abdominal pain.  Genitourinary: Negative for dysuria.  Musculoskeletal: Negative for neck pain and neck stiffness.  Neurological: Negative for headaches.     Physical  Exam Updated Vital Signs BP (!) 143/104 (BP Location: Right Arm)   Pulse 94   Temp 98.7 F (37.1 C) (Oral)   Resp 18   SpO2 100%   Physical Exam Vitals signs and nursing note reviewed.  Constitutional:      General: She is not in acute distress.    Appearance: She is well-developed. She is not diaphoretic.  HENT:     Head: Normocephalic and atraumatic.     Right Ear: Tympanic membrane, ear canal and external ear normal.     Left Ear: Tympanic membrane, ear canal and external ear normal.     Ears:     Comments: Bilateral serous effusions behind both TM.  No significant erythema or TM bulging bilaterally.    Nose: Mucosal edema and rhinorrhea present.     Mouth/Throat:     Pharynx: Uvula midline. No oropharyngeal exudate.     Tonsils: No tonsillar exudate.  Eyes:     General: No scleral icterus.    Conjunctiva/sclera: Conjunctivae normal.  Neck:     Musculoskeletal: Normal range of motion and neck supple.  Cardiovascular:     Rate and Rhythm: Normal rate and regular rhythm.     Heart sounds: Normal heart sounds.  Pulmonary:     Effort: Pulmonary effort is normal. No respiratory distress.     Breath sounds: Normal breath sounds. No wheezing.  Chest:     Chest wall: No tenderness.  Lymphadenopathy:     Cervical: No cervical adenopathy.  Skin:    General: Skin is warm and dry.  Neurological:     Mental Status: She is alert.  Psychiatric:        Mood and Affect: Mood normal.        Behavior: Behavior normal.      ED Treatments / Results  Labs (all labs ordered are listed, but only abnormal results are displayed) Labs Reviewed  GROUP A STREP BY PCR    EKG None  Radiology No results found.  Procedures Procedures (including critical care time)  Medications Ordered in ED Medications - No data to display   Initial Impression / Assessment and Plan / ED Course  I have reviewed the triage vital signs and the nursing notes.  Pertinent labs & imaging results  that were available during my care of the patient were reviewed by me and considered in my medical decision making (see chart for details).    Rapid strep negative.  Patients symptoms are consistent with URI, likely viral etiology. Discussed  that antibiotics are not indicated for viral infections. Pt will be discharged with symptomatic treatment.  Verbalizes understanding and is agreeable with plan. Pt is hemodynamically stable & in NAD prior to dc.    Final Clinical Impressions(s) / ED Diagnoses   Final diagnoses:  Sore throat  Ear pain, right    ED Discharge Orders    None       Norman ClayHammond, Thelton Graca W, PA-C 09/03/18 1119    Terrilee FilesButler, Michael C, MD 09/04/18 1313

## 2018-09-03 NOTE — Discharge Instructions (Addendum)
Today your strep test was negative.  As we discussed your ears have fluid behind them, which may be related to to be congestion from a cold, however could be developing ear infection.  At this time the ear is not clearly infected.

## 2018-09-03 NOTE — ED Triage Notes (Signed)
Pt here with c/o sore throat  , throat is slightly red , pt kids have been sick with same

## 2019-10-12 ENCOUNTER — Other Ambulatory Visit: Payer: Self-pay | Admitting: Orthopedic Surgery

## 2019-10-12 DIAGNOSIS — M533 Sacrococcygeal disorders, not elsewhere classified: Secondary | ICD-10-CM

## 2019-10-19 ENCOUNTER — Ambulatory Visit
Admission: RE | Admit: 2019-10-19 | Discharge: 2019-10-19 | Disposition: A | Discharge status: Home / Self Care | Payer: Worker's Compensation | Source: Ambulatory Visit | Attending: Orthopedic Surgery | Admitting: Orthopedic Surgery

## 2019-10-19 ENCOUNTER — Ambulatory Visit
Admission: RE | Admit: 2019-10-19 | Discharge: 2019-10-19 | Disposition: A | Discharge status: Home / Self Care | Payer: Self-pay | Source: Ambulatory Visit | Attending: Orthopedic Surgery | Admitting: Orthopedic Surgery

## 2019-10-19 ENCOUNTER — Other Ambulatory Visit: Payer: Self-pay

## 2019-10-19 DIAGNOSIS — M533 Sacrococcygeal disorders, not elsewhere classified: Secondary | ICD-10-CM

## 2019-11-03 ENCOUNTER — Other Ambulatory Visit: Payer: Self-pay | Admitting: Orthopedic Surgery

## 2019-11-03 DIAGNOSIS — M533 Sacrococcygeal disorders, not elsewhere classified: Secondary | ICD-10-CM

## 2019-11-14 ENCOUNTER — Ambulatory Visit
Admission: RE | Admit: 2019-11-14 | Discharge: 2019-11-14 | Disposition: A | Discharge status: Home / Self Care | Payer: Self-pay | Source: Ambulatory Visit | Attending: Orthopedic Surgery | Admitting: Orthopedic Surgery

## 2019-11-14 ENCOUNTER — Other Ambulatory Visit: Payer: Self-pay

## 2019-11-14 DIAGNOSIS — M533 Sacrococcygeal disorders, not elsewhere classified: Secondary | ICD-10-CM

## 2019-11-14 MED ORDER — METHYLPREDNISOLONE ACETATE 40 MG/ML INJ SUSP (RADIOLOG
120.0000 mg | Freq: Once | INTRAMUSCULAR | Status: AC
  Administered 2019-11-14: 120 mg via INTRA_ARTICULAR

## 2019-11-15 ENCOUNTER — Encounter (HOSPITAL_COMMUNITY): Payer: Self-pay | Admitting: Emergency Medicine

## 2019-11-15 ENCOUNTER — Other Ambulatory Visit: Payer: Self-pay

## 2019-11-15 ENCOUNTER — Ambulatory Visit (HOSPITAL_COMMUNITY)
Admission: EM | Admit: 2019-11-15 | Discharge: 2019-11-15 | Disposition: A | Discharge status: Home / Self Care | Payer: Self-pay | Attending: Emergency Medicine | Admitting: Emergency Medicine

## 2019-11-15 DIAGNOSIS — N9489 Other specified conditions associated with female genital organs and menstrual cycle: Secondary | ICD-10-CM | POA: Insufficient documentation

## 2019-11-15 DIAGNOSIS — B9689 Other specified bacterial agents as the cause of diseases classified elsewhere: Secondary | ICD-10-CM | POA: Insufficient documentation

## 2019-11-15 DIAGNOSIS — N760 Acute vaginitis: Secondary | ICD-10-CM | POA: Insufficient documentation

## 2019-11-15 MED ORDER — IBUPROFEN 600 MG PO TABS: 600.0000 mg | ORAL_TABLET | Freq: Four times a day (QID) | ORAL | 0 refills | Status: AC | PRN

## 2019-11-15 MED ORDER — DOXYCYCLINE HYCLATE 100 MG PO CAPS: 100.0000 mg | ORAL_CAPSULE | Freq: Two times a day (BID) | ORAL | 0 refills | Status: AC

## 2019-11-15 MED ORDER — METRONIDAZOLE 500 MG PO TABS: 500.0000 mg | ORAL_TABLET | Freq: Two times a day (BID) | ORAL | 0 refills | Status: AC

## 2019-11-15 NOTE — Discharge Instructions (Addendum)
Warm compresses to the labial mass.  600 mg ibuprofen combined with the 1000 mg Tylenol 3-4 times a day as needed for pain.  Finish doxycycline and the Flagyl.  We will call you if we need to treat you and your husband for any additional problems.  Do not drink while taking the Flagyl.  Follow-up with a GYN provider of your choice for routine care for further management of this if it does not resolve.  [facility] [employer] for [employer] at [facility]       Phone: [phone]  Center for [employer] at [place]   Phone: [phone]  Center for [employer] at [place]  Phone: [phone]  Center for [facility] at [employer]  Phone: [phone]  Center for [facility] at [place]  Phone: [phone]  [facility] for [facility] at [facility]   Phone: [phone]  [facility]       Phone: [phone]  [facility]    Phone: [phone]   [facility]    Phone: [phone]  [facility]    Phone: [phone]  [facility]    Phone: [phone]  [facility]       Phone: [phone]   [facility]  Phone: [phone]  [facility]    Phone: [phone]  [facility]   Phone: [phone]  [facility]      Phone: [phone]  [facility]    Phone: [phone]

## 2019-11-15 NOTE — ED Provider Notes (Signed)
HPI  SUBJECTIVE:  Shelly Silva is a 39 y.o. female who presents with 2 days of a painful "bump" of gradually increasing size where her left labia meets her thigh.  She is unsure if it is red or draining.  She denies trauma or known insect bite to the site.  No fever, body aches.  She has never had symptoms like this before.  She has tried alcohol warm compresses.  Warm compresses help, alcohol and palpation of the area makes her symptoms worse.  She also reports odorous brown discharge starting today.  No vaginal itching, blisters, nausea, vomiting, diarrhea, abdominal, back, pelvic pain.  No urinary complaints.  She is in a long-term monogamous relationship with her husband who is asymptomatic.  However STDs are a concern today.  She states that this reminds her of a gonorrhea and chlamydia infection that she had in the past.  No aggravating or alleviating factors.  She has not tried anything for this.  Past medical history of gonorrhea, chlamydia, HSV, yeast.  No history of HIV, syphilis, trichomonas, BV, PID, diabetes, MRSA infections.  No known contacts with MRSA.  LMP: Status post hysterectomy.  PMD: None.   Past Medical History:  Diagnosis Date  . Abnormal Pap smear of cervix   . Asthma   . Headache(784.0)   . History of miscarriage   . HSV infection   . Infection    UTI  . S/P tubal ligation 11/04/2014    Past Surgical History:  Procedure Laterality Date  . BILATERAL SALPINGECTOMY Bilateral 07/14/2017   Procedure: BILATERAL SALPINGECTOMY;  Surgeon: Tereso Newcomer, MD;  Location: [facility];  Service: Gynecology;  Laterality: Bilateral;  . DILATATION & CURETTAGE/HYSTEROSCOPY WITH TRUECLEAR  2003, 2006, 2006  . DILATION AND CURETTAGE OF UTERUS    . GYNECOLOGIC CRYOSURGERY  2007  . TUBAL LIGATION N/A 11/03/2014   Procedure: POST PARTUM TUBAL LIGATION;  Surgeon: Adam Phenix, MD;  Location: [facility];  Service: Gynecology;  Laterality: N/A;  . VAGINAL HYSTERECTOMY N/A 07/14/2017    Procedure: HYSTERECTOMY VAGINAL;  Surgeon: Tereso Newcomer, MD;  Location: [facility];  Service: Gynecology;  Laterality: N/A;    Family History  Problem Relation Age of Onset  . Diabetes Mother   . Breast cancer Paternal Aunt   . Hearing loss Neg Hx     Social History   Tobacco Use  . Smoking status: Current Some Day Smoker    Packs/day: 0.25    Years: 7.00    Pack years: 1.75    Types: Cigarettes  . Smokeless tobacco: Never Used  . Tobacco comment: off and on  Substance Use Topics  . Alcohol use: Yes    Alcohol/week: 1.0 standard drinks    Types: 1 Shots of liquor per week    Comment: occ  . Drug use: No    No current facility-administered medications for this encounter.  Current Outpatient Medications:  .  aspirin-acetaminophen-caffeine (EXCEDRIN MIGRAINE) 250-250-65 MG tablet, Take 1 tablet by mouth every 6 (six) hours as needed for headache., Disp: 30 tablet, Rfl: 0 .  docusate sodium (COLACE) 100 MG capsule, Take 1 capsule (100 mg total) 2 (two) times daily by mouth., Disp: 30 capsule, Rfl: 2 .  doxycycline (VIBRAMYCIN) 100 MG capsule, Take 1 capsule (100 mg total) by mouth 2 (two) times daily for 7 days., Disp: 14 capsule, Rfl: 0 .  ferrous sulfate (FERROUSUL) 325 (65 FE) MG tablet, Take 1 tablet (325 mg total) by mouth 2 (two)  times daily., Disp: 60 tablet, Rfl: 1 .  ibuprofen (ADVIL) 600 MG tablet, Take 1 tablet (600 mg total) by mouth every 6 (six) hours as needed for moderate pain or cramping (mild pain)., Disp: 30 tablet, Rfl: 0 .  metroNIDAZOLE (FLAGYL) 500 MG tablet, Take 1 tablet (500 mg total) by mouth 2 (two) times daily for 7 days., Disp: 14 tablet, Rfl: 0  No Known Allergies   ROS  As noted in HPI.   Physical Exam  BP 132/87 (BP Location: Right Arm)   Pulse 94   Temp 98.1 F (36.7 C) (Oral)   Resp 18   SpO2 98%   Constitutional: Well developed, well nourished, no acute distress Eyes:  EOMI, conjunctiva normal bilaterally HENT: Normocephalic,  atraumatic,mucus membranes moist Respiratory: Normal inspiratory effort Cardiovascular: Normal rate GI: nondistended soft, nontender. No suprapubic tenderness  back: No CVA tenderness GU: External genitalia normal.  No inner or external labial erythema ,blisters , rash.  +2 cm tender mass in the left labia and some tender swelling inferiorly at the junction of the left external labia and buttock.  No expressible purulent discharge.  No obvious opening.  Normal vaginal mucosa.  Normal os. thick  nonoderous  White vaginal discharge.  Surgical cuff intact. no adnexal tenderness. No adnexal masses.  Chaperone present during exam skin: No rash, skin intact Musculoskeletal: no deformities Neurologic: Alert & oriented x 3, no focal neuro deficits Psychiatric: Speech and behavior appropriate   ED Course   Medications - No data to display  No orders of the defined types were placed in this encounter.  ED Clinical Impression  1. BV (bacterial vaginosis)   2. Mass of female genital structure      ED Assessment/Plan  Could be labial abscess versus early Bartholin gland cyst/abscess.  the tender mass is superior to the area of the Bartholin gland.  There does not appear to be anything to I&D there.  Tenderness and fullness at the junction of the lower labia and buttock is slightly below where I would expect a Bartholin's to be.  Again nothing to I&D.  No fluctuance.  There is no expressible purulent drainage from the area.  Suspect that the brownish discharge that she reports is coming from this although I was unable to find the area where this would be draining.  She does have extensive thick white vaginal discharge so will treat for BV with Flagyl 500 mg p.o. twice daily for 1 week.  Will send home with Doxy 100 mg p.o. twice daily which will cover a MRSA abscess and also chlamydia.  Patient wanted to wait for labs prior to being treated for gonorrhea.  Advised her that both she and her husband will  need to be treated if the lab comes back positive for gonorrhea.  Warm compresses.  Ibuprofen 600 mg combined with 1000 mg of Tylenol 3-4 times a day as needed for pain, will provide GYN follow-up list.  Discussed labs, MDM, treatment and follow-up plan, signs and symptoms that should prompt return to the ED with patient. Pt agrees with plan.   Meds ordered this encounter  Medications  . ibuprofen (ADVIL) 600 MG tablet    Sig: Take 1 tablet (600 mg total) by mouth every 6 (six) hours as needed for moderate pain or cramping (mild pain).    Dispense:  30 tablet    Refill:  0  . doxycycline (VIBRAMYCIN) 100 MG capsule    Sig: Take 1 capsule (100 mg total)  by mouth 2 (two) times daily for 7 days.    Dispense:  14 capsule    Refill:  0  . metroNIDAZOLE (FLAGYL) 500 MG tablet    Sig: Take 1 tablet (500 mg total) by mouth 2 (two) times daily for 7 days.    Dispense:  14 tablet    Refill:  0    *This clinic note was created using Scientist, clinical (histocompatibility and immunogenetics). Therefore, there may be occasional mistakes despite careful proofreading.  ?    Domenick Gong, MD 11/15/19 1941

## 2019-11-15 NOTE — ED Triage Notes (Signed)
Pt here for vaginal discharge with odor and also sts "bump" in vaginal area x 3 days

## 2019-11-17 LAB — CERVICOVAGINAL ANCILLARY ONLY
Bacterial vaginitis: POSITIVE — AB
Candida vaginitis: NEGATIVE
Chlamydia: NEGATIVE
Neisseria Gonorrhea: NEGATIVE
Trichomonas: NEGATIVE

## 2020-12-07 ENCOUNTER — Emergency Department (HOSPITAL_COMMUNITY): Payer: Self-pay

## 2020-12-07 ENCOUNTER — Emergency Department (HOSPITAL_COMMUNITY)
Admission: EM | Admit: 2020-12-07 | Discharge: 2020-12-07 | Disposition: A | Discharge status: Home / Self Care | Payer: Self-pay | Attending: Emergency Medicine | Admitting: Emergency Medicine

## 2020-12-07 ENCOUNTER — Other Ambulatory Visit: Payer: Self-pay

## 2020-12-07 ENCOUNTER — Encounter (HOSPITAL_COMMUNITY): Payer: Self-pay | Admitting: Emergency Medicine

## 2020-12-07 ENCOUNTER — Ambulatory Visit (HOSPITAL_COMMUNITY)
Admission: EM | Admit: 2020-12-07 | Discharge: 2020-12-07 | Disposition: A | Discharge status: Home / Self Care | Payer: Self-pay

## 2020-12-07 DIAGNOSIS — R1031 Right lower quadrant pain: Secondary | ICD-10-CM | POA: Insufficient documentation

## 2020-12-07 DIAGNOSIS — F17210 Nicotine dependence, cigarettes, uncomplicated: Secondary | ICD-10-CM | POA: Insufficient documentation

## 2020-12-07 DIAGNOSIS — B349 Viral infection, unspecified: Secondary | ICD-10-CM | POA: Insufficient documentation

## 2020-12-07 DIAGNOSIS — Z7984 Long term (current) use of oral hypoglycemic drugs: Secondary | ICD-10-CM | POA: Insufficient documentation

## 2020-12-07 DIAGNOSIS — M791 Myalgia, unspecified site: Secondary | ICD-10-CM | POA: Insufficient documentation

## 2020-12-07 DIAGNOSIS — J45909 Unspecified asthma, uncomplicated: Secondary | ICD-10-CM | POA: Insufficient documentation

## 2020-12-07 DIAGNOSIS — M542 Cervicalgia: Secondary | ICD-10-CM | POA: Insufficient documentation

## 2020-12-07 DIAGNOSIS — G8929 Other chronic pain: Secondary | ICD-10-CM | POA: Insufficient documentation

## 2020-12-07 DIAGNOSIS — R110 Nausea: Secondary | ICD-10-CM

## 2020-12-07 DIAGNOSIS — R32 Unspecified urinary incontinence: Secondary | ICD-10-CM

## 2020-12-07 DIAGNOSIS — N3941 Urge incontinence: Secondary | ICD-10-CM | POA: Insufficient documentation

## 2020-12-07 DIAGNOSIS — R739 Hyperglycemia, unspecified: Secondary | ICD-10-CM | POA: Insufficient documentation

## 2020-12-07 DIAGNOSIS — R109 Unspecified abdominal pain: Secondary | ICD-10-CM

## 2020-12-07 DIAGNOSIS — M545 Low back pain, unspecified: Secondary | ICD-10-CM

## 2020-12-07 DIAGNOSIS — R519 Headache, unspecified: Secondary | ICD-10-CM | POA: Insufficient documentation

## 2020-12-07 LAB — URINALYSIS, ROUTINE W REFLEX MICROSCOPIC
Bilirubin Urine: NEGATIVE
Glucose, UA: >=500 mg/dL — AB
Ketones, ur: 20 mg/dL — AB
Leukocytes,Ua: NEGATIVE
Nitrite: NEGATIVE
Protein, ur: 100 mg/dL — AB
Specific Gravity, Urine: 1.028 (ref 1.005–1.030)
pH: 5 (ref 5.0–8.0)

## 2020-12-07 LAB — CBC WITH DIFFERENTIAL/PLATELET
Abs Immature Granulocytes: 0.04 10*3/uL (ref 0.00–0.07)
Basophils Absolute: 0.1 10*3/uL (ref 0.0–0.1)
Basophils Relative: 1 %
Eosinophils Absolute: 0.1 10*3/uL (ref 0.0–0.5)
Eosinophils Relative: 1 %
HCT: 40.5 % (ref 36.0–46.0)
Hemoglobin: 14 g/dL (ref 12.0–15.0)
Immature Granulocytes: 1 %
Lymphocytes Relative: 23 %
Lymphs Abs: 2 10*3/uL (ref 0.7–4.0)
MCH: 31.7 pg (ref 26.0–34.0)
MCHC: 34.6 g/dL (ref 30.0–36.0)
MCV: 91.6 fL (ref 80.0–100.0)
Monocytes Absolute: 0.8 10*3/uL (ref 0.1–1.0)
Monocytes Relative: 9 %
Neutro Abs: 5.5 10*3/uL (ref 1.7–7.7)
Neutrophils Relative %: 65 %
Platelets: 177 10*3/uL (ref 150–400)
RBC: 4.42 MIL/uL (ref 3.87–5.11)
RDW: 11.9 % (ref 11.5–15.5)
WBC: 8.4 10*3/uL (ref 4.0–10.5)
nRBC: 0 % (ref 0.0–0.2)

## 2020-12-07 LAB — COMPREHENSIVE METABOLIC PANEL
ALT: 38 U/L (ref 0–44)
AST: 32 U/L (ref 15–41)
Albumin: 3.3 g/dL — ABNORMAL LOW (ref 3.5–5.0)
Alkaline Phosphatase: 103 U/L (ref 38–126)
Anion gap: 8 (ref 5–15)
BUN: 8 mg/dL (ref 6–20)
CO2: 24 mmol/L (ref 22–32)
Calcium: 8.6 mg/dL — ABNORMAL LOW (ref 8.9–10.3)
Chloride: 99 mmol/L (ref 98–111)
Creatinine, Ser: 0.67 mg/dL (ref 0.44–1.00)
GFR, Estimated: >60 mL/min (ref >60)
Glucose, Bld: 319 mg/dL — ABNORMAL HIGH (ref 70–99)
Potassium: 3.6 mmol/L (ref 3.5–5.1)
Sodium: 131 mmol/L — ABNORMAL LOW (ref 135–145)
Total Bilirubin: 0.5 mg/dL (ref 0.3–1.2)
Total Protein: 6.6 g/dL (ref 6.5–8.1)

## 2020-12-07 LAB — LIPASE, BLOOD: Lipase: 24 U/L (ref 11–51)

## 2020-12-07 MED ORDER — MORPHINE SULFATE (PF) 4 MG/ML IV SOLN
4.0000 mg | Freq: Once | INTRAVENOUS | Status: AC
  Administered 2020-12-07: 4 mg via INTRAVENOUS
  Filled 2020-12-07: qty 1

## 2020-12-07 MED ORDER — ONDANSETRON HCL 4 MG PO TABS: 4.0000 mg | ORAL_TABLET | Freq: Three times a day (TID) | ORAL | 0 refills | Status: DC | PRN

## 2020-12-07 MED ORDER — METOCLOPRAMIDE HCL 5 MG/ML IJ SOLN
10.0000 mg | Freq: Once | INTRAMUSCULAR | Status: AC
  Administered 2020-12-07: 10 mg via INTRAVENOUS
  Filled 2020-12-07: qty 2

## 2020-12-07 MED ORDER — SODIUM CHLORIDE 0.9 % IV BOLUS
1000.0000 mL | Freq: Once | INTRAVENOUS | Status: AC
  Administered 2020-12-07: 1000 mL via INTRAVENOUS

## 2020-12-07 MED ORDER — METOCLOPRAMIDE HCL 10 MG PO TABS: 10.0000 mg | ORAL_TABLET | Freq: Two times a day (BID) | ORAL | 0 refills | Status: DC | PRN

## 2020-12-07 MED ORDER — KETOROLAC TROMETHAMINE 15 MG/ML IJ SOLN
15.0000 mg | Freq: Once | INTRAMUSCULAR | Status: AC
  Administered 2020-12-07: 15 mg via INTRAVENOUS
  Filled 2020-12-07: qty 1

## 2020-12-07 MED ORDER — METFORMIN HCL 1000 MG PO TABS: 1000.0000 mg | ORAL_TABLET | Freq: Every day | ORAL | 0 refills | Status: DC

## 2020-12-07 MED ORDER — SODIUM CHLORIDE 0.9 % IV BOLUS
500.0000 mL | Freq: Once | INTRAVENOUS | Status: AC
  Administered 2020-12-07: 500 mL via INTRAVENOUS

## 2020-12-07 MED ORDER — IBUPROFEN 600 MG PO TABS: 600.0000 mg | ORAL_TABLET | Freq: Four times a day (QID) | ORAL | 0 refills | Status: AC | PRN

## 2020-12-07 NOTE — ED Provider Notes (Signed)
[facility]    CSN: [id number] Arrival date & time: 12/07/20  1522      History   Chief Complaint No chief complaint on file.   HPI Shelly Silva is a 40 y.o. female presenting with severe back pain with new onset of urinary incontinence; food poisoning and vaginal discharge.  Medical history chronic back pain following injury, BV, asthma, headaches, HSV, UTI. -states she started feeling poorly following eating at restaurant 3 days ago. Notes chills, nausea without vomiting, fatigue for 3 days. Denies abd pain (though there is some on exam).  -Exacerbation of chronic back pain, states this is the worst that it ever been.  States new onset of urinary incontinence and constipation, though she did have 1 regular bowel movement today.  Pain is reasonably well controlled on muscle relaxant.  Denies pain shooting down legs, denies numbness in arms/legs, denies weakness in arms/legs, denies saddle anesthesia.  -Also with vaginal discharge for 1 day. Was treated for BV on 11/14/20, states this did initially improve.  Also endorses some bumps on it.  States she has no STI risk, but she is not sure about her husband.  HPI  Past Medical History:  Diagnosis Date  . Abnormal Pap smear of cervix   . Asthma   . Headache(784.0)   . History of miscarriage   . HSV infection   . Infection    UTI  . S/P tubal ligation 11/04/2014    Patient Active Problem List   Diagnosis Date Noted  . S/P TVH, BS on 07/16/17 for AUB 07/14/2017    Past Surgical History:  Procedure Laterality Date  . BILATERAL SALPINGECTOMY Bilateral 07/14/2017   Procedure: BILATERAL SALPINGECTOMY;  Surgeon: Tereso Newcomer, MD;  Location: [facility];  Service: Gynecology;  Laterality: Bilateral;  . DILATATION & CURETTAGE/HYSTEROSCOPY WITH TRUECLEAR  2003, 2006, 2006  . DILATION AND CURETTAGE OF UTERUS    . GYNECOLOGIC CRYOSURGERY  2007  . TUBAL LIGATION N/A 11/03/2014   Procedure: POST PARTUM TUBAL LIGATION;   Surgeon: Adam Phenix, MD;  Location: [facility];  Service: Gynecology;  Laterality: N/A;  . VAGINAL HYSTERECTOMY N/A 07/14/2017   Procedure: HYSTERECTOMY VAGINAL;  Surgeon: Tereso Newcomer, MD;  Location: [facility];  Service: Gynecology;  Laterality: N/A;    OB History    Gravida  10   Para  6   Term  5   Preterm  1   AB  4   Living  6     SAB  4   IAB      Ectopic      Multiple  0   Live Births  6            Home Medications    Prior to Admission medications   Medication Sig Start Date End Date Taking? Authorizing Provider  aspirin-acetaminophen-caffeine (EXCEDRIN MIGRAINE) [phone] MG tablet Take 1 tablet by mouth every 6 (six) hours as needed for headache. 12/04/17   Mathews Robinsons B, PA-C  docusate sodium (COLACE) 100 MG capsule Take 1 capsule (100 mg total) 2 (two) times daily by mouth. 07/15/17   Anyanwu, Jethro Bastos, MD  ferrous sulfate (FERROUSUL) 325 (65 FE) MG tablet Take 1 tablet (325 mg total) by mouth 2 (two) times daily. 03/05/17   Anyanwu, Jethro Bastos, MD  ibuprofen (ADVIL) 600 MG tablet Take 1 tablet (600 mg total) by mouth every 6 (six) hours as needed for moderate pain or cramping (mild pain). 11/15/19   Mortenson,  Morrie Sheldon, MD  methocarbamol (ROBAXIN) 500 MG tablet TAKE 1 TABLET BY MOUTH EVERY SIX TO EIGHT HOURS AS NEEDED DAYTIME FOR SPASMS/MUSCLE TENSION 09/26/20   [provider]    Family History Family History  Problem Relation Age of Onset  . Diabetes Mother   . Breast cancer Paternal Aunt   . Hearing loss Neg Hx     Social History Social History   Tobacco Use  . Smoking status: Current Some Day Smoker    Packs/day: 0.25    Years: 7.00    Pack years: 1.75    Types: Cigarettes  . Smokeless tobacco: Never Used  . Tobacco comment: off and on  Vaping Use  . Vaping Use: Never used  Substance Use Topics  . Alcohol use: Yes    Alcohol/week: 1.0 standard drink    Types: 1 Shots of liquor per week    Comment: occ  . Drug use: No      Allergies   Patient has no known allergies.   Review of Systems Review of Systems  Constitutional: Positive for chills and fever. Negative for appetite change and diaphoresis.  HENT: Negative for congestion, ear pain, rhinorrhea, sinus pressure, sinus pain and sore throat.   Eyes: Negative for redness and visual disturbance.  Respiratory: Negative for cough, chest tightness, shortness of breath and wheezing.   Cardiovascular: Negative for chest pain and palpitations.  Gastrointestinal: Positive for abdominal pain. Negative for blood in stool, constipation, diarrhea, nausea and vomiting.  Genitourinary: Positive for vaginal discharge. Negative for decreased urine volume, difficulty urinating, dysuria, flank pain, frequency, genital sores, hematuria, menstrual problem, pelvic pain, urgency, vaginal bleeding and vaginal pain.  Musculoskeletal: Positive for back pain. Negative for myalgias.  Neurological: Negative for dizziness, weakness, light-headedness and headaches.  Psychiatric/Behavioral: Negative for confusion.  All other systems reviewed and are negative.    Physical Exam Triage Vital Signs ED Triage Vitals  Enc Vitals Group     BP 12/07/20 1544 132/80     Pulse Rate 12/07/20 1544 93     Resp 12/07/20 1544 16     Temp 12/07/20 1544 98.3 F (36.8 C)     Temp Source 12/07/20 1544 Oral     SpO2 12/07/20 1544 100 %     Weight --      Height --      Head Circumference --      Peak Flow --      Pain Score 12/07/20 1542 7     Pain Loc --      Pain Edu? --      Excl. in GC? --    No data found.  Updated Vital Signs BP 132/80 (BP Location: Right Arm)   Pulse 93   Temp 98.3 F (36.8 C) (Oral)   Resp 16   LMP  (LMP Unknown) Comment: continous bleeding  SpO2 100%   Visual Acuity Right Eye Distance:   Left Eye Distance:   Bilateral Distance:    Right Eye Near:   Left Eye Near:    Bilateral Near:     Physical Exam Vitals reviewed.  Constitutional:       General: She is not in acute distress.    Appearance: Normal appearance. She is not ill-appearing.  HENT:     Head: Normocephalic and atraumatic.     Right Ear: Hearing, tympanic membrane, ear canal and external ear normal. No swelling or tenderness. There is no impacted cerumen. No mastoid tenderness. Tympanic membrane is not perforated, erythematous,  retracted or bulging.     Left Ear: Hearing, tympanic membrane, ear canal and external ear normal. No swelling or tenderness. There is no impacted cerumen. No mastoid tenderness. Tympanic membrane is not perforated, erythematous, retracted or bulging.     Nose:     Right Sinus: No maxillary sinus tenderness or frontal sinus tenderness.     Left Sinus: No maxillary sinus tenderness or frontal sinus tenderness.     Mouth/Throat:     Mouth: Mucous membranes are moist.     Pharynx: Uvula midline. No oropharyngeal exudate or posterior oropharyngeal erythema.     Tonsils: No tonsillar exudate.     Comments: Moist mucous membranes Eyes:     Extraocular Movements: Extraocular movements intact.     Pupils: Pupils are equal, round, and reactive to light.  Cardiovascular:     Rate and Rhythm: Normal rate and regular rhythm.     Heart sounds: Normal heart sounds.  Pulmonary:     Effort: Pulmonary effort is normal.     Breath sounds: Normal breath sounds and air entry. No wheezing, rhonchi or rales.  Chest:     Chest wall: No tenderness.  Abdominal:     General: Abdomen is flat. Bowel sounds are normal. There is no distension.     Palpations: Abdomen is soft. There is no mass.     Tenderness: There is abdominal tenderness in the right lower quadrant. There is no right CVA tenderness, left CVA tenderness, guarding or rebound.  Musculoskeletal:     Cervical back: Normal. No swelling, deformity, signs of trauma, spasms, torticollis, tenderness, bony tenderness or crepitus. No pain with movement. Normal range of motion.     Thoracic back: Normal. No  swelling, edema, deformity, signs of trauma, lacerations, spasms, tenderness or bony tenderness. Normal range of motion. No scoliosis.     Lumbar back: Spasms, tenderness and bony tenderness present. No swelling, edema, deformity, signs of trauma or lacerations. Normal range of motion. Negative right straight leg raise test and negative left straight leg raise test. No scoliosis.     Comments: Sensation intact Strength 5/5 in UEs and LEs Gait intact but with pain   Lymphadenopathy:     Cervical: No cervical adenopathy.  Skin:    General: Skin is warm.     Capillary Refill: Capillary refill takes less than 2 seconds.     Comments: Good skin turgor  Neurological:     General: No focal deficit present.     Mental Status: She is alert and oriented to person, place, and time.  Psychiatric:        Attention and Perception: Attention and perception normal.        Mood and Affect: Mood and affect normal.        Behavior: Behavior normal. Behavior is cooperative.        Thought Content: Thought content normal.        Judgment: Judgment normal.      UC Treatments / Results  Labs (all labs ordered are listed, but only abnormal results are displayed) Labs Reviewed - No data to display  EKG   Radiology No results found.  Procedures Procedures (including critical care time)  Medications Ordered in UC Medications - No data to display  Initial Impression / Assessment and Plan / UC Course  I have reviewed the triage vital signs and the nursing notes.  Pertinent labs & imaging results that were available during my care of the patient were reviewed by me  and considered in my medical decision making (see chart for details).     This patient is a 40 year old female presenting with back pain with new onset of urinary incontinence, abdominal pain, vaginal discharge, and nausea. Today this pt is afebrile nontachycardic nontachypneic, oxygenating well on room air, no wheezes rhonchi or rales.  Hysterectomy for contraception.   Given history of chronic back pain with new onset of worst back pain of life and urinary incontinence, I am recommending this patient head to the emergency room immediately for further evaluation and management.  Low suspicion for cauda equina, but cannot completely rule this out here.  Patient is in agreement and states she will head straight there.  Final Clinical Impressions(s) / UC Diagnoses   Final diagnoses:  Chronic bilateral low back pain without sciatica  Urinary incontinence, unspecified type  Nausea without vomiting     Discharge Instructions     -Head straight to emergency room for further evaluation and management of back pain with new onset of urinary incontinence. -If you experience worsening of symptoms on the way, like dizziness, weakness or sensation changes in 1 arm or 1 leg, chest pain-stop and call 911.    ED Prescriptions    None     PDMP not reviewed this encounter.   Rhys MartiniGraham, Cresencia Asmus E, PA-C 12/07/20 1642

## 2020-12-07 NOTE — Discharge Instructions (Addendum)
-  Head straight to emergency room for further evaluation and management of back pain with new onset of urinary incontinence. -If you experience worsening of symptoms on the way, like dizziness, weakness or sensation changes in 1 arm or 1 leg, chest pain-stop and call 911.

## 2020-12-07 NOTE — ED Notes (Signed)
All appropriate discharge materials reviewed at length with patient. Time for questions provided. Pt has no other questions at this time and verbalizes understanding of all provided materials.  

## 2020-12-07 NOTE — ED Notes (Signed)
Pt to MRI

## 2020-12-07 NOTE — ED Triage Notes (Signed)
Pt presents with chills, nausea, fatigue xs 3 days. Pt also c/o vaginal discharge xs 1 day.

## 2020-12-07 NOTE — ED Provider Notes (Signed)
[facility] EMERGENCY DEPARTMENT Provider Note   CSN: [id number] Arrival date & time: 12/07/20  1633     History Chief Complaint  Patient presents with  . Back Pain  . Urinary Incontinence  . Abdominal Pain    Shelly Silva is a 40 y.o. female presented to emergency department from urgent care with concern for urinary incontinence and abdominal pain.  The patient reports gradual onset of suprapubic and right lower quadrant abdominal pain 4 days ago, associated with nausea, early episodes of vomiting, no diarrhea or constipation.  She also began having pain in her lower back, and states she has chronic back pain at her "SI joint".  She noted for the past 2 to 3 days, she has had urinary incontinence.  She feels like she is needing to use the bathroom frequently and doesn't have control over her bladder. This is never happened to her before.  She denies any falls or trauma.  She denies fevers or chills.  She does report myalgias in her neck bilaterally, pain in her neck worse with lateral movement.  She denies photophobia, frontal headache.  She denies history of surgery on her back.  She denies history of diabetes.  She reports she has had 1 C-section and tubal ligation, denies any other surgical history.  She denies known hx of diabetes or any other significant medical problems.  She does suffer from chronic headaches and neck pain, for which she takes muscle relaxers, but they aren't helping her much this time.  HPI     Past Medical History:  Diagnosis Date  . Abnormal Pap smear of cervix   . Asthma   . Headache(784.0)   . History of miscarriage   . HSV infection   . Infection    UTI  . S/P tubal ligation 11/04/2014    Patient Active Problem List   Diagnosis Date Noted  . S/P TVH, BS on 07/16/17 for AUB 07/14/2017    Past Surgical History:  Procedure Laterality Date  . BILATERAL SALPINGECTOMY Bilateral 07/14/2017   Procedure: BILATERAL  SALPINGECTOMY;  Surgeon: Tereso NewcomerAnyanwu, Ugonna A, MD;  Location: [facility];  Service: Gynecology;  Laterality: Bilateral;  . DILATATION & CURETTAGE/HYSTEROSCOPY WITH TRUECLEAR  2003, 2006, 2006  . DILATION AND CURETTAGE OF UTERUS    . GYNECOLOGIC CRYOSURGERY  2007  . TUBAL LIGATION N/A 11/03/2014   Procedure: POST PARTUM TUBAL LIGATION;  Surgeon: Adam PhenixJames G Arnold, MD;  Location: [facility];  Service: Gynecology;  Laterality: N/A;  . VAGINAL HYSTERECTOMY N/A 07/14/2017   Procedure: HYSTERECTOMY VAGINAL;  Surgeon: Tereso NewcomerAnyanwu, Ugonna A, MD;  Location: [facility];  Service: Gynecology;  Laterality: N/A;     OB History    Gravida  10   Para  6   Term  5   Preterm  1   AB  4   Living  6     SAB  4   IAB      Ectopic      Multiple  0   Live Births  6           Family History  Problem Relation Age of Onset  . Diabetes Mother   . Breast cancer Paternal Aunt   . Hearing loss Neg Hx     Social History   Tobacco Use  . Smoking status: Current Some Day Smoker    Packs/day: 0.25    Years: 7.00    Pack years: 1.75    Types: Cigarettes  .  Smokeless tobacco: Never Used  . Tobacco comment: off and on  Vaping Use  . Vaping Use: Never used  Substance Use Topics  . Alcohol use: Yes    Alcohol/week: 1.0 standard drink    Types: 1 Shots of liquor per week    Comment: occ  . Drug use: No    Home Medications Prior to Admission medications   Medication Sig Start Date End Date Taking? Authorizing Provider  ibuprofen (ADVIL) 600 MG tablet Take 1 tablet (600 mg total) by mouth every 6 (six) hours as needed for up to 30 doses for headache, mild pain or moderate pain. 12/07/20  Yes Terald Sleeper, MD  aspirin-acetaminophen-caffeine [facility] MIGRAINE) [id number] MG tablet Take 1 tablet by mouth every 6 (six) hours as needed for headache. 12/04/17   Mathews Robinsons B, PA-C  docusate sodium (COLACE) 100 MG capsule Take 1 capsule (100 mg total) 2 (two) times daily by mouth. 07/15/17   Anyanwu, Jethro Bastos, MD  ferrous sulfate (FERROUSUL) 325 (65 FE) MG tablet Take 1 tablet (325 mg total) by mouth 2 (two) times daily. 03/05/17   Anyanwu, Jethro Bastos, MD  ibuprofen (ADVIL) 600 MG tablet Take 1 tablet (600 mg total) by mouth every 6 (six) hours as needed for moderate pain or cramping (mild pain). 11/15/19   Domenick Gong, MD  metFORMIN (GLUCOPHAGE) 1000 MG tablet Take 1 tablet (1,000 mg total) by mouth daily with breakfast. 12/07/20 01/06/21  Terald Sleeper, MD  methocarbamol (ROBAXIN) 500 MG tablet TAKE 1 TABLET BY MOUTH EVERY SIX TO EIGHT HOURS AS NEEDED DAYTIME FOR SPASMS/MUSCLE TENSION 09/26/20   [provider]  metoCLOPramide (REGLAN) 10 MG tablet Take 1 tablet (10 mg total) by mouth 2 (two) times daily as needed for up to 6 doses (Headache). 12/07/20   Terald Sleeper, MD  ondansetron (ZOFRAN) 4 MG tablet Take 1 tablet (4 mg total) by mouth every 8 (eight) hours as needed for up to 15 doses for nausea or vomiting. 12/07/20   Terald Sleeper, MD    Allergies    Patient has no known allergies.  Review of Systems   Review of Systems  Constitutional: Negative for chills and fever.  Eyes: Negative for photophobia and visual disturbance.  Respiratory: Negative for cough and shortness of breath.   Cardiovascular: Negative for chest pain and palpitations.  Gastrointestinal: Positive for abdominal pain and nausea. Negative for vomiting.  Genitourinary: Positive for dysuria and frequency. Negative for vaginal bleeding and vaginal discharge.  Musculoskeletal: Positive for arthralgias, back pain and neck pain. Negative for neck stiffness.  Skin: Negative for color change and rash.  Neurological: Positive for headaches. Negative for seizures, syncope, speech difficulty, weakness and numbness.  All other systems reviewed and are negative.   Physical Exam Updated Vital Signs BP 110/82 (BP Location: Left Arm)   Pulse (!) 104   Temp 98.4 F (36.9 C) (Oral)   Resp 16   Ht 5\' 2"  (1.575 m)    Wt 68 kg   LMP  (LMP Unknown) Comment: continous bleeding  SpO2 100%   BMI 27.44 kg/m   Physical Exam Constitutional:      General: She is not in acute distress. HENT:     Head: Normocephalic and atraumatic.  Eyes:     Conjunctiva/sclera: Conjunctivae normal.     Pupils: Pupils are equal, round, and reactive to light.  Neck:     Meningeal: Brudzinski's sign and Kernig's sign absent.  Comments: Bilateral trigger point tenderness paraspinal, deltoid Cardiovascular:     Rate and Rhythm: Normal rate and regular rhythm.     Heart sounds: Normal heart sounds.  Pulmonary:     Effort: Pulmonary effort is normal. No respiratory distress.  Abdominal:     General: There is no distension.     Tenderness: There is abdominal tenderness in the suprapubic area. There is no guarding or rebound.  Musculoskeletal:     Cervical back: No rigidity.  Skin:    General: Skin is warm and dry.  Neurological:     General: No focal deficit present.     Mental Status: She is alert. Mental status is at baseline.     GCS: GCS eye subscore is 4. GCS verbal subscore is 5. GCS motor subscore is 6.     Cranial Nerves: Cranial nerves are intact.     Sensory: Sensation is intact.     Motor: Motor function is intact.     Gait: Gait is intact.     Comments: No saddle anesthesia 5/5 strength in bilateral lower extremities No spinal midline tenderness or overlying skin lesions  Psychiatric:        Mood and Affect: Mood normal.        Behavior: Behavior normal.     ED Results / Procedures / Treatments   Labs (all labs ordered are listed, but only abnormal results are displayed) Labs Reviewed  COMPREHENSIVE METABOLIC PANEL - Abnormal; Notable for the following components:      Result Value   Sodium 131 (*)    Glucose, Bld 319 (*)    Calcium 8.6 (*)    Albumin 3.3 (*)    All other components within normal limits  URINALYSIS, ROUTINE W REFLEX MICROSCOPIC - Abnormal; Notable for the following  components:   APPearance HAZY (*)    Glucose, UA >=500 (*)    Hgb urine dipstick MODERATE (*)    Ketones, ur 20 (*)    Protein, ur 100 (*)    Bacteria, UA FEW (*)    All other components within normal limits  CBC WITH DIFFERENTIAL/PLATELET  LIPASE, BLOOD  HEMOGLOBIN A1C    EKG None  Radiology MR LUMBAR SPINE WO CONTRAST  Result Date: 12/07/2020 CLINICAL DATA:  Low back pain, progressive neurologic deficit and urinary incontinence. EXAM: MRI LUMBAR SPINE WITHOUT CONTRAST TECHNIQUE: Multiplanar, multisequence MR imaging of the lumbar spine was performed. No intravenous contrast was administered. COMPARISON:  MRI 05/19/2019 FINDINGS: Segmentation: There are five lumbar type vertebral bodies. The last full intervertebral disc space is labeled L5-S1. This correlates with the prior study. Alignment:  Normal Vertebrae:  Normal marrow signal.  No bone lesions or fractures. Conus medullaris and cauda equina: Conus extends to the L1-2 level. Conus and cauda equina appear normal. Paraspinal and other soft tissues: No significant paraspinal or retroperitoneal findings. Disc levels: No disc protrusions, spinal or foraminal stenosis. IMPRESSION: Unremarkable lumbar spine MRI examination. Electronically Signed   By: Rudie Meyer M.D.   On: 12/07/2020 20:17   CT Renal Stone Study  Result Date: 12/07/2020 CLINICAL DATA:  Hematuria. EXAM: CT ABDOMEN AND PELVIS WITHOUT CONTRAST TECHNIQUE: Multidetector CT imaging of the abdomen and pelvis was performed following the standard protocol without IV contrast. COMPARISON:  None. FINDINGS: Lower chest: Bilateral lower lobe subsegmental atelectasis. Hepatobiliary: Liver is enlarged measuring up to 20.5 Cm. No focal liver abnormality. No gallstones, gallbladder wall thickening, or pericholecystic fluid. No biliary dilatation. Pancreas: No focal lesion. Normal pancreatic  contour. No surrounding inflammatory changes. No main pancreatic ductal dilatation. Spleen: Normal in  size without focal abnormality. Adrenals/Urinary Tract: No adrenal nodule bilaterally. No nephrolithiasis, no hydronephrosis, and no contour-deforming renal mass. No ureterolithiasis or hydroureter. The urinary bladder is unremarkable. Stomach/Bowel: Stomach is within normal limits. No evidence of bowel wall thickening or dilatation. Appendix appears normal. Vascular/Lymphatic: No significant vascular findings are present. No enlarged abdominal or pelvic lymph nodes. Reproductive: Status post hysterectomy. No adnexal masses. Other: No intraperitoneal free fluid. No intraperitoneal free gas. No organized fluid collection. Musculoskeletal: No acute or significant osseous findings. IMPRESSION: 1. No acute intra-abdominal or intrapelvic abnormality on this noncontrast study. 2. Hepatomegaly. Electronically Signed   By: Tish Frederickson M.D.   On: 12/07/2020 21:54    Procedures Procedures   Medications Ordered in ED Medications  sodium chloride 0.9 % bolus 1,000 mL (0 mLs Intravenous Stopped 12/07/20 2346)  morphine 4 MG/ML injection 4 mg (4 mg Intravenous Given 12/07/20 1904)  ketorolac (TORADOL) 15 MG/ML injection 15 mg (15 mg Intravenous Given 12/07/20 1902)  sodium chloride 0.9 % bolus 500 mL (0 mLs Intravenous Stopped 12/07/20 2201)  metoCLOPramide (REGLAN) injection 10 mg (10 mg Intravenous Given 12/07/20 2243)    ED Course  I have reviewed the triage vital signs and the nursing notes.  Pertinent labs & imaging results that were available during my care of the patient were reviewed by me and considered in my medical decision making (see chart for details).   This constellation of symptoms can be consistent with viral illness, particularly with muscle aches/myalgia, headache, abdominal pain, nausea.  Her workup shows possible diabetic ketosis as well.  Per patient's reported urinary incontinence and back pain, I've ordered an MRI lumbar spine.  This was reviewed showing no focal lesion or cauda  equina.  Her UA shows protein, ketones, glucose, hgb.  Negative for signs of infection. BMP show fasting glucose 319 This is suggestive of diabetes.  We can start her on metformin and give some fluids here.  No anion gap to suggest DKA.  She's able to drink fluids at home.  CT renal study with no evidence kidney stone or inflammatory process. WBC normal.  Lower suspicion for acute appendicitis or biliary disease.  Likewise post-tubal ligation, less likely ovarian torsion.  No pelvic complaints or symptoms.  At this time I have a lower suspicion for spinal infection or bacterial meningitis.  She does not have nuccal rigidity.  Her neck pain is focal, trigger point trapezius tenderness.  She has no photophobia.  No fever or leukocytosis.  No hx of IVDA or sign of overlying skin infection to suggest nidus for spinal infection.    IV headache and pain medications ordered with some relief of symptoms. Husband present at bedside to take her home.   Clinical Course as of 12/08/20 0105  Fri Dec 07, 2020  2027 MRI is largely unremarkable.  She is continuing to have some abdominal pain.  With the blood in her urine and her urinary symptoms, we will obtain a renal stone study to see if this is perhaps a kidney stone. [MT]  2314 Pt does not have PCP.  With her fasting hyperglycemia and sugar of 319, and her ketones and glucose in the urine, but is reasonable to start her Metformin.  She is likely diabetic.  She does have a family history of this.  I will start her on Metformin 500 mg twice daily.  I explained she needs to follow-up with the  PCP for this, and she'll call to establish care with one. [MT]  2315 This point I believe that her symptoms for likely a viral infection.  No kidney stone or acute intra-abdominal process noted on CT.  She has no leukocytosis or fever. [MT]  2320 On reassessment she reports her headache is improved with the Reglan.  I will discharge her with his medications.  At this  time, the clinically lower suspicion for intracranial hemorrhage, or acute meningitis.  Close return precautions were given. [MT]    Clinical Course User Index [MT] Breona Cherubin, Kermit Balo, MD    Final Clinical Impression(s) / ED Diagnoses Final diagnoses:  Nonintractable headache, unspecified chronicity pattern, unspecified headache type  Abdominal pain, unspecified abdominal location  Hyperglycemia  Viral syndrome    Rx / DC Orders ED Discharge Orders         Ordered    ondansetron (ZOFRAN) 4 MG tablet  Every 8 hours PRN,   Status:  Discontinued        12/07/20 2322    metoCLOPramide (REGLAN) 10 MG tablet  2 times daily PRN,   Status:  Discontinued        12/07/20 2322    metFORMIN (GLUCOPHAGE) 1000 MG tablet  Daily with breakfast,   Status:  Discontinued        12/07/20 2322    ibuprofen (ADVIL) 600 MG tablet  Every 6 hours PRN        12/07/20 2322    metoCLOPramide (REGLAN) 10 MG tablet  2 times daily PRN        12/07/20 2323    metFORMIN (GLUCOPHAGE) 1000 MG tablet  Daily with breakfast        12/07/20 2324    ondansetron (ZOFRAN) 4 MG tablet  Every 8 hours PRN        12/07/20 2324           Terald Sleeper, MD 12/08/20 0105

## 2020-12-07 NOTE — ED Triage Notes (Signed)
Emergency Medicine Provider Triage Evaluation Note  Shelly Silva , a 40 y.o. female  was evaluated in triage.  Pt complains of right side back pain.  Pain from cervical to lumbar back.  Having difficulty holding urine.  Pain began on Tuesday.  Has baseline pain to right lumbar back.   No recent falls or injuries.    Patient had RLQ pain on exam at urgent care prior to arrival here.    Review of Systems  Positive: Chills, back pain, vaginal discharge  Negative: Fever, dysuria, hematuria,   Physical Exam  BP 118/88 (BP Location: Right Arm)   Pulse (!) 102   Temp 98.4 F (36.9 C)   Resp 16   LMP  (LMP Unknown) Comment: continous bleeding  SpO2 100%  Gen:   Awake, no distress   HEENT:  Atraumatic  Resp:  Normal effort  Cardiac:  Normal rate  Abd:   Nondistended, soft, tender to RLQ MSK:   Moves extremities without difficulty, no midline tenderness or deformity to cervical, thoracic or lumbar spine, tenderness to right side of back Neuro:  Speech clear  Medical Decision Making  Medically screening exam initiated at 5:05 PM.  Appropriate orders placed.  [name] was informed that the remainder of the evaluation will be completed by another provider, this initial triage assessment does not replace that evaluation, and the importance of remaining in the ED until their evaluation is complete.  Clinical Impression   Back pain, urinary incontinence, RLQ pain.  The patient appears stable so that the remainder of the work up may be completed by another provider.     Haskel Schroeder, [place] 12/07/20 1713

## 2020-12-07 NOTE — ED Triage Notes (Signed)
Patient from [facility], complaint of right sided back pain, urinary incontinence, and abdominal pain since Tuesday. VSS. NAD.

## 2020-12-07 NOTE — Discharge Instructions (Signed)
You were seen in the ER for several medical issues.  This included problems with urinating, or urinating frequently.  You had an MRI of your lower spine which did not show signs of nerve injury.  You also had a CT scan of your abdomen which did not show kidney stones, or signs of infection in your abdomen.  You were given medication for your symptoms including morphine, nausea medicine, and headache medicine.  I think you are likely experiencing symptoms of a virus.  Most viruses resolve in 3-5 days.  You can take the medication prescribed for nausea and headache at home, as needed.  We talked about your high blood sugars.  You may have diabetes.  I started you on a medicine called metformin to help with this.  It is very important you call to make an appointment with a new primary care provider.  Call tomorrow for an appointment.  You need more of a work-up as an outpatient, and may need to be on extra medicine for your blood sugar.  For the next week, make an effort to drink lots of water to keep your body hydrated.  *  You should return to the emergency department if you are having severe, worsening headache, confusion, blurred vision or loss of vision, severe vomiting (and cannot keep down water), or other significant pain that you cannot control at home.

## 2021-12-19 ENCOUNTER — Emergency Department (HOSPITAL_COMMUNITY)
Admission: EM | Admit: 2021-12-19 | Discharge: 2021-12-19 | Disposition: A | Discharge status: Home / Self Care | Payer: Self-pay | Attending: Emergency Medicine | Admitting: Emergency Medicine

## 2021-12-19 ENCOUNTER — Encounter (HOSPITAL_COMMUNITY): Payer: Self-pay | Admitting: Emergency Medicine

## 2021-12-19 ENCOUNTER — Other Ambulatory Visit: Payer: Self-pay

## 2021-12-19 DIAGNOSIS — R432 Parageusia: Secondary | ICD-10-CM

## 2021-12-19 DIAGNOSIS — Z7984 Long term (current) use of oral hypoglycemic drugs: Secondary | ICD-10-CM | POA: Insufficient documentation

## 2021-12-19 DIAGNOSIS — R438 Other disturbances of smell and taste: Secondary | ICD-10-CM | POA: Insufficient documentation

## 2021-12-19 DIAGNOSIS — R051 Acute cough: Secondary | ICD-10-CM | POA: Insufficient documentation

## 2021-12-19 DIAGNOSIS — Z7982 Long term (current) use of aspirin: Secondary | ICD-10-CM | POA: Insufficient documentation

## 2021-12-19 DIAGNOSIS — M791 Myalgia, unspecified site: Secondary | ICD-10-CM | POA: Insufficient documentation

## 2021-12-19 DIAGNOSIS — R197 Diarrhea, unspecified: Secondary | ICD-10-CM | POA: Insufficient documentation

## 2021-12-19 NOTE — ED Triage Notes (Signed)
Patient here with complaint of generalized body aches that started last Friday. 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  Patient is alert, oriented SpO2 100% on room air, speaking in complete sentences, afebrile in triage but reprts taking dayquil earlier today. ?

## 2021-12-19 NOTE — ED Provider Notes (Signed)
?[facility] EMERGENCY DEPARTMENT ?Provider Note ? ? ?CSN: [id number] ?Arrival date & time: 12/19/21  1620 ? ?  ? ?History ? ?Chief Complaint  ?Patient presents with  ? Generalized Body Aches  ? ? ?Shelly Silva is a 41 y.o. female.  Patient presents to the emergency department with symptoms of cough, body aches, diarrhea, and loss of taste.  Patient states that a coworker was recently diagnosed with COVID.  Patient states that she began having some symptoms on Friday but lost her taste on Tuesday of this week.  Patient denies fevers at home.  Denies shortness of breath.  Denies abdominal pain.  Denies nausea.  Endorses cough, diarrhea, body aches, loss of taste.  Works in a medical facility.  Does have history of asthma ? ?HPI ? ?  ? ?Home Medications ?Prior to Admission medications   ?Medication Sig Start Date End Date Taking? Authorizing Provider  ?aspirin-acetaminophen-caffeine (EXCEDRIN MIGRAINE) 250-250-65 MG tablet Take 1 tablet by mouth every 6 (six) hours as needed for headache. 12/04/17   Mathews Robinsons B, PA-C  ?docusate sodium (COLACE) 100 MG capsule Take 1 capsule (100 mg total) 2 (two) times daily by mouth. 07/15/17   Anyanwu, Jethro Bastos, MD  ?ferrous sulfate (FERROUSUL) 325 (65 FE) MG tablet Take 1 tablet (325 mg total) by mouth 2 (two) times daily. 03/05/17   Anyanwu, Jethro Bastos, MD  ?ibuprofen (ADVIL) 600 MG tablet Take 1 tablet (600 mg total) by mouth every 6 (six) hours as needed for moderate pain or cramping (mild pain). 11/15/19   Domenick Gong, MD  ?ibuprofen (ADVIL) 600 MG tablet Take 1 tablet (600 mg total) by mouth every 6 (six) hours as needed for up to 30 doses for headache, mild pain or moderate pain. 12/07/20   Terald Sleeper, MD  ?metFORMIN (GLUCOPHAGE) 1000 MG tablet Take 1 tablet (1,000 mg total) by mouth daily with breakfast. 12/07/20 01/06/21  Terald Sleeper, MD  ?methocarbamol (ROBAXIN) 500 MG tablet TAKE 1 TABLET BY MOUTH EVERY SIX TO EIGHT HOURS AS NEEDED  DAYTIME FOR SPASMS/MUSCLE TENSION 09/26/20   [provider]  ?metoCLOPramide (REGLAN) 10 MG tablet Take 1 tablet (10 mg total) by mouth 2 (two) times daily as needed for up to 6 doses (Headache). 12/07/20   Terald Sleeper, MD  ?ondansetron (ZOFRAN) 4 MG tablet Take 1 tablet (4 mg total) by mouth every 8 (eight) hours as needed for up to 15 doses for nausea or vomiting. 12/07/20   Terald Sleeper, MD  ?   ? ?Allergies    ?Patient has no known allergies.   ? ?Review of Systems   ?Review of Systems  ?Constitutional:  Negative for fever.  ?HENT:    ?     Loss of taste  ?Respiratory:  Positive for cough. Negative for shortness of breath.   ?Cardiovascular:  Negative for chest pain.  ?Gastrointestinal:  Positive for diarrhea. Negative for abdominal pain and nausea.  ?Musculoskeletal:  Positive for myalgias.  ? ?Physical Exam ?Updated Vital Signs ?BP (!) 148/104 (BP Location: Right Arm)   Pulse (!) 101   Temp 98.6 ?F (37 ?C) (Oral)   Resp 16   LMP  (LMP Unknown) Comment: continous bleeding  SpO2 100%  ?Physical Exam ?Vitals and nursing note reviewed.  ?Constitutional:   ?   General: She is not in acute distress. ?HENT:  ?   Head: Normocephalic and atraumatic.  ?   Mouth/Throat:  ?   Mouth: Mucous membranes  are moist.  ?Eyes:  ?   Conjunctiva/sclera: Conjunctivae normal.  ?Cardiovascular:  ?   Rate and Rhythm: Normal rate and regular rhythm.  ?   Pulses: Normal pulses.  ?   Heart sounds: Normal heart sounds.  ?Pulmonary:  ?   Effort: Pulmonary effort is normal.  ?   Breath sounds: Normal breath sounds.  ?Abdominal:  ?   Palpations: Abdomen is soft.  ?   Tenderness: There is no abdominal tenderness.  ?Musculoskeletal:  ?   Cervical back: Normal range of motion.  ?Skin: ?   General: Skin is warm and dry.  ?Neurological:  ?   Mental Status: She is alert and oriented to person, place, and time.  ? ? ?ED Results / Procedures / Treatments   ?Labs ?(all labs ordered are listed, but only abnormal results are  displayed) ?Labs Reviewed - No data to display ? ?EKG ?None ? ?Radiology ?No results found. ? ?Procedures ?Procedures  ? ? ?Medications Ordered in ED ?Medications - No data to display ? ?ED Course/ Medical Decision Making/ A&P ?  ?                        ?Medical Decision Making ? ?The patient presents with symptoms of cough, body ache, loss of taste.  Differential includes COVID-19, influenza, and other viral illnesses ? ?I ordered lab work for the patient.  COVID 19 and influenza test are pending at this time. ? ?I see no reason to order imaging at this time.  There is no indication for admission at this time. ? ?The patient is outside of the window for antiviral treatment.  I discussed symptomatic management with the patient using Tylenol and Advil as needed.  The patient may check MyChart for results.  I recommend return to work on Monday to allow 5 days from the onset of COVID symptoms.  If she is COVID and influenza negative, she can may return to work once fever free.  Discharge home ? ?Final Clinical Impression(s) / ED Diagnoses ?Final diagnoses:  ?Loss of taste  ?Acute cough  ?Myalgia  ? ? ?Rx / DC Orders ?ED Discharge Orders   ? ? None  ? ?  ? ? ?  ?Darrick Grinder, PA-C ?12/19/21 1641 ? ?  ?Mancel Bale, MD ?12/20/21 0014 ? ?

## 2021-12-19 NOTE — Discharge Instructions (Addendum)
You were seen today for symptoms consistent with viral illness.  Recommend checking MyChart later tonight for results of the COVID-19 and influenza testing.  Recommend return to work on Monday.  If COVID and influenza are negative, you may return to work once fever free.  You should use Tylenol or ibuprofen as needed for fever and pain control.  Return if you develop severe shortness of breath, chest pain, or other life threats ?

## 2022-04-09 ENCOUNTER — Ambulatory Visit (HOSPITAL_COMMUNITY)
Admission: EM | Admit: 2022-04-09 | Discharge: 2022-04-09 | Disposition: A | Discharge status: Home / Self Care | Payer: Self-pay | Attending: Internal Medicine | Admitting: Internal Medicine

## 2022-04-09 ENCOUNTER — Encounter (HOSPITAL_COMMUNITY): Payer: Self-pay

## 2022-04-09 DIAGNOSIS — R21 Rash and other nonspecific skin eruption: Secondary | ICD-10-CM

## 2022-04-09 DIAGNOSIS — B3731 Acute candidiasis of vulva and vagina: Secondary | ICD-10-CM

## 2022-04-09 MED ORDER — TERCONAZOLE 0.8 % VA CREA: 1.0000 | TOPICAL_CREAM | Freq: Every day | VAGINAL | 0 refills | Status: AC

## 2022-04-09 MED ORDER — FLUCONAZOLE 150 MG PO TABS: 150.0000 mg | ORAL_TABLET | ORAL | 0 refills | Status: DC

## 2022-04-09 NOTE — ED Triage Notes (Signed)
Pt c/o vaginal itching x3 wks, used monistat with no relief. Denies vaginal discharge.

## 2022-04-09 NOTE — ED Provider Notes (Signed)
[facility]    CSN: [id number] Arrival date & time: 04/09/22  1335      History   Chief Complaint Chief Complaint  Patient presents with   Vaginal Itching    HPI Shelly Silva is a 41 y.o. female.   Patient presents to urgent care for evaluation of vaginal itching and thick white vaginal discharge that has been ongoing over the last 2 weeks. She has tried monistat cream over the counter without relief of symptoms. She denies recent new sexual partners, possible exposure to STIs, nausea, vomiting, urinary symptoms, possible pregnancy, abdominal discomfort, back pain, and fever/chills. Denies constipation/diarrhea. 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 Reports vaginal itching is very uncomfortable, but denies pain to the vaginal canal. She is sexually active with 1 female partner. She states she "feels like there's something else going on down there" alluding to a possible rash to the vaginal area.  No other aggravating relieving factors identified at this time per patient symptoms.   Vaginal Itching  P  Past Medical History:  Diagnosis Date   Abnormal Pap smear of cervix    Asthma    Headache(784.0)    History of miscarriage    HSV infection    Infection    UTI   S/P tubal ligation 11/04/2014    Patient Active Problem List   Diagnosis Date Noted   S/P TVH, BS on 07/16/17 for AUB 07/14/2017    Past Surgical History:  Procedure Laterality Date   BILATERAL SALPINGECTOMY Bilateral 07/14/2017   Procedure: BILATERAL SALPINGECTOMY;  Surgeon: Tereso Newcomer, MD;  Location: [facility];  Service: Gynecology;  Laterality: Bilateral;   DILATATION & CURETTAGE/HYSTEROSCOPY WITH TRUECLEAR  2003, 2006, 2006   DILATION AND CURETTAGE OF UTERUS     GYNECOLOGIC CRYOSURGERY  2007   TUBAL LIGATION N/A 11/03/2014   Procedure: POST PARTUM TUBAL LIGATION;  Surgeon: Adam Phenix, MD;  Location: [facility];  Service: Gynecology;   Laterality: N/A;   VAGINAL HYSTERECTOMY N/A 07/14/2017   Procedure: HYSTERECTOMY VAGINAL;  Surgeon: Tereso Newcomer, MD;  Location: [facility];  Service: Gynecology;  Laterality: N/A;    OB History     Gravida  10   Para  6   Term  5   Preterm  1   AB  4   Living  6      SAB  4   IAB      Ectopic      Multiple  0   Live Births  6            Home Medications    Prior to Admission medications   Medication Sig Start Date End Date Taking? Authorizing Provider  fluconazole (DIFLUCAN) 150 MG tablet Take 1 tablet (150 mg total) by mouth every 3 (three) days. 04/09/22  Yes Carlisle Beers, FNP  terconazole (TERAZOL 3) 0.8 % vaginal cream Place 1 applicator vaginally at bedtime for 5 days. 04/09/22 04/14/22 Yes Carlisle Beers, FNP  aspirin-acetaminophen-caffeine (EXCEDRIN MIGRAINE) [phone] MG tablet Take 1 tablet by mouth every 6 (six) hours as needed for headache. 12/04/17   Mathews Robinsons B, PA-C  docusate sodium (COLACE) 100 MG capsule Take 1 capsule (100 mg total) 2 (two) times daily by mouth. 07/15/17   Anyanwu, Jethro Bastos, MD  ferrous sulfate (FERROUSUL) 325 (65 FE) MG tablet Take 1 tablet (325 mg total) by mouth 2 (two) times daily. 03/05/17  Anyanwu, Jethro Bastos, MD  ibuprofen (ADVIL) 600 MG tablet Take 1 tablet (600 mg total) by mouth every 6 (six) hours as needed for moderate pain or cramping (mild pain). 11/15/19   Domenick Gong, MD  ibuprofen (ADVIL) 600 MG tablet Take 1 tablet (600 mg total) by mouth every 6 (six) hours as needed for up to 30 doses for headache, mild pain or moderate pain. 12/07/20   Terald Sleeper, MD  metFORMIN (GLUCOPHAGE) 1000 MG tablet Take 1 tablet (1,000 mg total) by mouth daily with breakfast. 12/07/20 01/06/21  Terald Sleeper, MD  methocarbamol (ROBAXIN) 500 MG tablet TAKE 1 TABLET BY MOUTH EVERY SIX TO EIGHT HOURS AS NEEDED DAYTIME FOR SPASMS/MUSCLE TENSION 09/26/20   [provider]  metoCLOPramide (REGLAN) 10 MG tablet  Take 1 tablet (10 mg total) by mouth 2 (two) times daily as needed for up to 6 doses (Headache). 12/07/20   Terald Sleeper, MD  ondansetron (ZOFRAN) 4 MG tablet Take 1 tablet (4 mg total) by mouth every 8 (eight) hours as needed for up to 15 doses for nausea or vomiting. 12/07/20   Terald Sleeper, MD    Family History Family History  Problem Relation Age of Onset   Diabetes Mother    Breast cancer Paternal Aunt    Hearing loss Neg Hx     Social History Social History   Tobacco Use   Smoking status: Some Days    Packs/day: 0.25    Years: 7.00    Total pack years: 1.75    Types: Cigarettes   Smokeless tobacco: Never   Tobacco comments:    off and on  Vaping Use   Vaping Use: Never used  Substance Use Topics   Alcohol use: Yes    Alcohol/week: 1.0 standard drink of alcohol    Types: 1 Shots of liquor per week    Comment: occ   Drug use: No     Allergies   Patient has no known allergies.   Review of Systems Review of Systems Per HPI  Physical Exam Triage Vital Signs ED Triage Vitals [04/09/22 1349]  Enc Vitals Group     BP (!) 138/91     Pulse Rate (!) 114     Resp 18     Temp 99.2 F (37.3 C)     Temp Source Oral     SpO2 97 %     Weight      Height      Head Circumference      Peak Flow      Pain Score 0     Pain Loc      Pain Edu?      Excl. in GC?    No data found.  Updated Vital Signs BP (!) 138/91 (BP Location: Left Arm)   Pulse (!) 114   Temp 99.2 F (37.3 C) (Oral)   Resp 18   LMP  (LMP Unknown) Comment: continous bleeding  SpO2 97%   Visual Acuity Right Eye Distance:   Left Eye Distance:   Bilateral Distance:    Right Eye Near:   Left Eye Near:    Bilateral Near:     Physical Exam Vitals and nursing note reviewed. Exam conducted with a chaperone present (Tiffany LPN).  Constitutional:      Appearance: Normal appearance. She is not ill-appearing or toxic-appearing.     Comments: Very pleasant patient sitting on exam in  position of comfort table in no acute distress.  HENT:     Head: Normocephalic and atraumatic.     Right Ear: Hearing and external ear normal.     Left Ear: Hearing and external ear normal.     Nose: Nose normal.     Mouth/Throat:     Lips: Pink.     Mouth: Mucous membranes are moist.  Eyes:     General: Lids are normal. Vision grossly intact. Gaze aligned appropriately.     Extraocular Movements: Extraocular movements intact.     Conjunctiva/sclera: Conjunctivae normal.  Pulmonary:     Effort: Pulmonary effort is normal.  Abdominal:     Palpations: Abdomen is soft.  Genitourinary:    Exam position: Knee-chest position.     Labia:        Right: Rash present.        Left: Rash present.      Vagina: Vaginal discharge present.     Rectum: Normal.     Comments: Erythematous and irritated appearing rash to the bilateral labia and minora consistent with vaginal candidal rash.  White discharge present to the vaginal canal with mild swelling of the vaginal canal externally.  No drainage to the rash.  No ulcerations present. Musculoskeletal:     Cervical back: Neck supple.  Skin:    General: Skin is warm and dry.     Capillary Refill: Capillary refill takes less than 2 seconds.     Findings: No rash.  Neurological:     General: No focal deficit present.     Mental Status: She is alert and oriented to person, place, and time. Mental status is at baseline.     Cranial Nerves: No dysarthria or facial asymmetry.     Gait: Gait is intact.  Psychiatric:        Mood and Affect: Mood normal.        Speech: Speech normal.        Behavior: Behavior normal.        Thought Content: Thought content normal.        Judgment: Judgment normal.      UC Treatments / Results  Labs (all labs ordered are listed, but only abnormal results are displayed) Labs Reviewed  CERVICOVAGINAL ANCILLARY ONLY    EKG   Radiology No results found.  Procedures Procedures (including critical care  time)  Medications Ordered in UC Medications - No data to display  Initial Impression / Assessment and Plan / UC Course  I have reviewed the triage vital signs and the nursing notes.  Pertinent labs & imaging results that were available during my care of the patient were reviewed by me and considered in my medical decision making (see chart for details).  1.  Vaginal candidiasis and external vaginal candidiasis rash Diflucan prescribed to empirically treat for vaginal yeast infection.  Patient to take 1 pill of Diflucan every 3 days (3 doses) to treat vaginal yeast infection.  She is also to apply a small amount of terconazole cream to the external vagina once daily at nighttime over the next 5 to 7 days to treat candidal rash to the external vagina.  STI testing is pending.  Will treat based on STI testing results for any other positive results.  No clinical indication for Valtrex prescription today as there are no ulcerations or lesions consistent with HSV to physical exam of the external vagina.  Patient is agreeable with this plan.   Discussed physical exam and available lab work findings in clinic with patient.  Counseled patient regarding appropriate use of medications and potential side effects for all medications recommended or prescribed today. Discussed red flag signs and symptoms of worsening condition,when to call the PCP office, return to urgent care, and when to seek higher level of care in the emergency department. Patient verbalizes understanding and agreement with plan. All questions answered. Patient discharged in stable condition.  Final Clinical Impressions(s) / UC Diagnoses   Final diagnoses:  Vaginal candidiasis  Rash and nonspecific skin eruption     Discharge Instructions      Take 1 diflucan pill starting today, then every 3 days for 2 more doses to treat your vaginal yeast infection. You may also apply terconazole cream to the external vagina for the next 5-7 days  to treat the candidal rash to the external vagina.   Return to urgent care for any new or worsening symptoms.  Your STI testing will come back in the next 2-3 days. We will call you if anything else comes back positive requiring further treatment.   If you develop any new or worsening symptoms or do not improve in the next 2 to 3 days, please return.  If your symptoms are severe, please go to the emergency room.  Follow-up with your primary care provider for further evaluation and management of your symptoms as well as ongoing wellness visits.  I hope you feel better!     ED Prescriptions     Medication Sig Dispense Auth. Provider   terconazole (TERAZOL 3) 0.8 % vaginal cream Place 1 applicator vaginally at bedtime for 5 days. 20 g Reita May M, FNP   fluconazole (DIFLUCAN) 150 MG tablet Take 1 tablet (150 mg total) by mouth every 3 (three) days. 3 tablet Carlisle Beers, FNP      PDMP not reviewed this encounter.   Carlisle Beers, [place] 04/09/22 1541

## 2022-04-09 NOTE — Discharge Instructions (Addendum)
Take 1 diflucan pill starting today, then every 3 days for 2 more doses to treat your vaginal yeast infection. You may also apply terconazole cream to the external vagina for the next 5-7 days to treat the candidal rash to the external vagina.   Return to urgent care for any new or worsening symptoms.  Your STI testing will come back in the next 2-3 days. We will call you if anything else comes back positive requiring further treatment.   If you develop any new or worsening symptoms or do not improve in the next 2 to 3 days, please return.  If your symptoms are severe, please go to the emergency room.  Follow-up with your primary care provider for further evaluation and management of your symptoms as well as ongoing wellness visits.  I hope you feel better!

## 2022-04-10 LAB — CERVICOVAGINAL ANCILLARY ONLY
Bacterial Vaginitis (gardnerella): POSITIVE — AB
Candida Glabrata: NEGATIVE
Candida Vaginitis: POSITIVE — AB
Chlamydia: NEGATIVE
Comment: NEGATIVE
Comment: NEGATIVE
Comment: NEGATIVE
Comment: NEGATIVE
Comment: NEGATIVE
Comment: NORMAL
Neisseria Gonorrhea: NEGATIVE
Trichomonas: NEGATIVE

## 2022-04-11 ENCOUNTER — Telehealth (HOSPITAL_COMMUNITY): Payer: Self-pay | Admitting: Emergency Medicine

## 2022-04-11 MED ORDER — METRONIDAZOLE 500 MG PO TABS: 500.0000 mg | ORAL_TABLET | Freq: Two times a day (BID) | ORAL | 0 refills | Status: DC

## 2023-09-17 ENCOUNTER — Ambulatory Visit (HOSPITAL_COMMUNITY)
Admission: EM | Admit: 2023-09-17 | Discharge: 2023-09-17 | Disposition: A | Discharge status: Home / Self Care | Payer: Self-pay | Attending: Family Medicine | Admitting: Family Medicine

## 2023-09-17 ENCOUNTER — Encounter (HOSPITAL_COMMUNITY): Payer: Self-pay

## 2023-09-17 DIAGNOSIS — N764 Abscess of vulva: Secondary | ICD-10-CM

## 2023-09-17 HISTORY — DX: Malignant (primary) neoplasm, unspecified: C80.1

## 2023-09-17 MED ORDER — DOXYCYCLINE HYCLATE 100 MG PO CAPS: 100.0000 mg | ORAL_CAPSULE | Freq: Two times a day (BID) | ORAL | 0 refills | Status: AC

## 2023-09-17 MED ORDER — HYDROCODONE-ACETAMINOPHEN 5-325 MG PO TABS: 2.0000 | ORAL_TABLET | ORAL | 0 refills | Status: AC | PRN

## 2023-09-17 NOTE — Discharge Instructions (Addendum)
You were seen today for a labial abscess.  I do not think this can be drained today.  I have sent out an antibiotic, and recommend warm sitz baths multiple times/day.  Please return if not improving over the next 24-48 hrs, or go to the ER for evaluation.

## 2023-09-17 NOTE — ED Provider Notes (Signed)
[facility]    CSN: [id number] Arrival date & time: 09/17/23  1109      History   Chief Complaint No chief complaint on file.   HPI Shelly Silva is a 43 y.o. female.   She shaved in her pubic area, and then noted a bump at the vaginal lips about 3 days ago.  It is painful.  The bumps/area of pain is spreading.  She does have HSV, but does not think this is it.        Past Medical History:  Diagnosis Date   Abnormal Pap smear of cervix    Asthma    Cancer (HCC)    Headache(784.0)    History of miscarriage    HSV infection    Infection    UTI   S/P tubal ligation 11/04/2014    Patient Active Problem List   Diagnosis Date Noted   S/P TVH, BS on 07/16/17 for AUB 07/14/2017    Past Surgical History:  Procedure Laterality Date   BILATERAL SALPINGECTOMY Bilateral 07/14/2017   Procedure: BILATERAL SALPINGECTOMY;  Surgeon: Tereso Newcomer, MD;  Location: [facility];  Service: Gynecology;  Laterality: Bilateral;   DILATATION & CURETTAGE/HYSTEROSCOPY WITH TRUECLEAR  2003, 2006, 2006   DILATION AND CURETTAGE OF UTERUS     GYNECOLOGIC CRYOSURGERY  2007   TUBAL LIGATION N/A 11/03/2014   Procedure: POST PARTUM TUBAL LIGATION;  Surgeon: Adam Phenix, MD;  Location: [facility];  Service: Gynecology;  Laterality: N/A;   VAGINAL HYSTERECTOMY N/A 07/14/2017   Procedure: HYSTERECTOMY VAGINAL;  Surgeon: Tereso Newcomer, MD;  Location: [facility];  Service: Gynecology;  Laterality: N/A;    OB History     Gravida  10   Para  6   Term  5   Preterm  1   AB  4   Living  6      SAB  4   IAB      Ectopic      Multiple  0   Live Births  6            Home Medications    Prior to Admission medications   Medication Sig Start Date End Date Taking? Authorizing Provider  aspirin-acetaminophen-caffeine (EXCEDRIN MIGRAINE) [phone] MG tablet Take 1 tablet by mouth every 6 (six) hours as needed for headache. 12/04/17   Mathews Robinsons B, PA-C  docusate  sodium (COLACE) 100 MG capsule Take 1 capsule (100 mg total) 2 (two) times daily by mouth. 07/15/17   Anyanwu, Jethro Bastos, MD  ferrous sulfate (FERROUSUL) 325 (65 FE) MG tablet Take 1 tablet (325 mg total) by mouth 2 (two) times daily. 03/05/17   Anyanwu, Jethro Bastos, MD  fluconazole (DIFLUCAN) 150 MG tablet Take 1 tablet (150 mg total) by mouth every 3 (three) days. 04/09/22   Carlisle Beers, FNP  ibuprofen (ADVIL) 600 MG tablet Take 1 tablet (600 mg total) by mouth every 6 (six) hours as needed for moderate pain or cramping (mild pain). 11/15/19   Domenick Gong, MD  ibuprofen (ADVIL) 600 MG tablet Take 1 tablet (600 mg total) by mouth every 6 (six) hours as needed for up to 30 doses for headache, mild pain or moderate pain. 12/07/20   Terald Sleeper, MD  metFORMIN (GLUCOPHAGE) 1000 MG tablet Take 1 tablet (1,000 mg total) by mouth daily with breakfast. 12/07/20 01/06/21  Terald Sleeper, MD  methocarbamol (ROBAXIN) 500 MG tablet TAKE 1 TABLET BY MOUTH EVERY SIX TO  EIGHT HOURS AS NEEDED DAYTIME FOR SPASMS/MUSCLE TENSION 09/26/20   [provider]  metoCLOPramide (REGLAN) 10 MG tablet Take 1 tablet (10 mg total) by mouth 2 (two) times daily as needed for up to 6 doses (Headache). 12/07/20   Terald Sleeper, MD  metroNIDAZOLE (FLAGYL) 500 MG tablet Take 1 tablet (500 mg total) by mouth 2 (two) times daily. 04/11/22   Lamptey, Britta Mccreedy, MD  ondansetron (ZOFRAN) 4 MG tablet Take 1 tablet (4 mg total) by mouth every 8 (eight) hours as needed for up to 15 doses for nausea or vomiting. 12/07/20   Terald Sleeper, MD    Family History Family History  Problem Relation Age of Onset   Diabetes Mother    Breast cancer Paternal Aunt    Hearing loss Neg Hx     Social History Social History   Tobacco Use   Smoking status: Former    Current packs/day: 0.25    Average packs/day: 0.3 packs/day for 7.0 years (1.8 ttl pk-yrs)    Types: Cigarettes   Smokeless tobacco: Never   Tobacco comments:     off and on  Vaping Use   Vaping status: Never Used  Substance Use Topics   Alcohol use: Yes    Alcohol/week: 1.0 standard drink of alcohol    Types: 1 Shots of liquor per week    Comment: occ   Drug use: No     Allergies   Patient has no known allergies.   Review of Systems Review of Systems  Constitutional: Negative.   HENT: Negative.    Respiratory: Negative.    Cardiovascular: Negative.   Gastrointestinal: Negative.   Genitourinary:  Positive for genital sores.  Psychiatric/Behavioral: Negative.       Physical Exam Triage Vital Signs ED Triage Vitals [09/17/23 1225]  Encounter Vitals Group     BP (!) 136/93     Systolic BP Percentile      Diastolic BP Percentile      Pulse Rate (!) 120     Resp 16     Temp 98.3 F (36.8 C)     Temp Source Oral     SpO2 98 %     Weight      Height      Head Circumference      Peak Flow      Pain Score 10     Pain Loc      Pain Education      Exclude from Growth Chart    No data found.  Updated Vital Signs BP (!) 136/93 (BP Location: Right Arm)   Pulse (!) 120   Temp 98.3 F (36.8 C) (Oral)   Resp 16   LMP  (LMP Unknown) Comment: continous bleeding  SpO2 98%   Visual Acuity Right Eye Distance:   Left Eye Distance:   Bilateral Distance:    Right Eye Near:   Left Eye Near:    Bilateral Near:     Physical Exam Constitutional:      General: She is not in acute distress.    Appearance: Normal appearance. She is not ill-appearing or toxic-appearing.     Comments: Appear uncomfortable  Cardiovascular:     Rate and Rhythm: Normal rate and regular rhythm.  Pulmonary:     Effort: Pulmonary effort is normal.     Breath sounds: Normal breath sounds.  Genitourinary:    Comments: At the right inferior labia is a sore/lesion that appears to be draining slightly;  the right labia is swollen, tender and hard Neurological:     Mental Status: She is alert. Mental status is at baseline.  Psychiatric:        Mood and  Affect: Mood normal.      UC Treatments / Results  Labs (all labs ordered are listed, but only abnormal results are displayed) Labs Reviewed - No data to display  EKG   Radiology No results found.  Procedures Procedures (including critical care time)  Medications Ordered in UC Medications - No data to display  Initial Impression / Assessment and Plan / UC Course  I have reviewed the triage vital signs and the nursing notes.  Pertinent labs & imaging results that were available during my care of the patient were reviewed by me and considered in my medical decision making (see chart for details).    Final Clinical Impressions(s) / UC Diagnoses   Final diagnoses:  Labial abscess     Discharge Instructions      You were seen today for a labial abscess.  I do not think this can be drained today.  I have sent out an antibiotic, and recommend warm sitz baths multiple times/day.  Please return if not improving over the next 24-48 hrs, or go to the ER for evaluation.     ED Prescriptions     Medication Sig Dispense Auth. Provider   doxycycline (VIBRAMYCIN) 100 MG capsule Take 1 capsule (100 mg total) by mouth 2 (two) times daily. 20 capsule Monik Lins, MD   HYDROcodone-acetaminophen (NORCO/VICODIN) 5-325 MG tablet Take 2 tablets by mouth every 4 (four) hours as needed. 10 tablet Jannifer Franklin, MD      PDMP not reviewed this encounter.   Jannifer Franklin, MD 09/17/23 1301

## 2023-09-17 NOTE — ED Triage Notes (Signed)
Patient states she shaved 3 days ago and now has swelling and very sore to touch.

## 2024-09-07 ENCOUNTER — Other Ambulatory Visit: Payer: Self-pay

## 2024-09-07 ENCOUNTER — Emergency Department (HOSPITAL_COMMUNITY): Payer: Self-pay

## 2024-09-07 ENCOUNTER — Encounter (HOSPITAL_COMMUNITY): Payer: Self-pay | Admitting: Pharmacy Technician

## 2024-09-07 ENCOUNTER — Emergency Department (HOSPITAL_COMMUNITY)
Admission: EM | Admit: 2024-09-07 | Discharge: 2024-09-07 | Disposition: A | Discharge status: Home / Self Care | Payer: Self-pay

## 2024-09-07 DIAGNOSIS — R55 Syncope and collapse: Secondary | ICD-10-CM | POA: Insufficient documentation

## 2024-09-07 DIAGNOSIS — E119 Type 2 diabetes mellitus without complications: Secondary | ICD-10-CM | POA: Insufficient documentation

## 2024-09-07 DIAGNOSIS — E876 Hypokalemia: Secondary | ICD-10-CM | POA: Insufficient documentation

## 2024-09-07 DIAGNOSIS — Z7984 Long term (current) use of oral hypoglycemic drugs: Secondary | ICD-10-CM | POA: Insufficient documentation

## 2024-09-07 DIAGNOSIS — Z7982 Long term (current) use of aspirin: Secondary | ICD-10-CM | POA: Insufficient documentation

## 2024-09-07 LAB — COMPREHENSIVE METABOLIC PANEL WITH GFR
ALT: 14 U/L (ref 0–44)
AST: 17 U/L (ref 15–41)
Albumin: 4.1 g/dL (ref 3.5–5.0)
Alkaline Phosphatase: 56 U/L (ref 38–126)
Anion gap: 13 (ref 5–15)
BUN: 8 mg/dL (ref 6–20)
CO2: 20 mmol/L — ABNORMAL LOW (ref 22–32)
Calcium: 9 mg/dL (ref 8.9–10.3)
Chloride: 106 mmol/L (ref 98–111)
Creatinine, Ser: 0.54 mg/dL (ref 0.44–1.00)
GFR, Estimated: >60 mL/min
Glucose, Bld: 215 mg/dL — ABNORMAL HIGH (ref 70–99)
Potassium: 3.1 mmol/L — ABNORMAL LOW (ref 3.5–5.1)
Sodium: 139 mmol/L (ref 135–145)
Total Bilirubin: 0.4 mg/dL (ref 0.0–1.2)
Total Protein: 7 g/dL (ref 6.5–8.1)

## 2024-09-07 LAB — CBC
HCT: 35.9 % — ABNORMAL LOW (ref 36.0–46.0)
Hemoglobin: 12.5 g/dL (ref 12.0–15.0)
MCH: 31.7 pg (ref 26.0–34.0)
MCHC: 34.8 g/dL (ref 30.0–36.0)
MCV: 91.1 fL (ref 80.0–100.0)
Platelets: 335 K/uL (ref 150–400)
RBC: 3.94 MIL/uL (ref 3.87–5.11)
RDW: 12.4 % (ref 11.5–15.5)
WBC: 6.7 K/uL (ref 4.0–10.5)
nRBC: 0 % (ref 0.0–0.2)

## 2024-09-07 LAB — LIPASE, BLOOD: Lipase: 30 U/L (ref 11–51)

## 2024-09-07 LAB — CBG MONITORING, ED: Glucose-Capillary: 207 mg/dL — ABNORMAL HIGH (ref 70–99)

## 2024-09-07 LAB — HCG, SERUM, QUALITATIVE: Preg, Serum: NEGATIVE

## 2024-09-07 MED ORDER — METFORMIN HCL 500 MG PO TABS: 500.0000 mg | ORAL_TABLET | Freq: Two times a day (BID) | ORAL | 0 refills | Status: AC

## 2024-09-07 MED ORDER — POTASSIUM CHLORIDE CRYS ER 20 MEQ PO TBCR
40.0000 meq | EXTENDED_RELEASE_TABLET | Freq: Once | ORAL | Status: AC
  Administered 2024-09-07: 40 meq via ORAL
  Filled 2024-09-07: qty 2

## 2024-09-07 MED ORDER — METFORMIN HCL 500 MG PO TABS
500.0000 mg | ORAL_TABLET | Freq: Once | ORAL | Status: AC
  Administered 2024-09-07: 500 mg via ORAL
  Filled 2024-09-07: qty 1

## 2024-09-07 NOTE — ED Triage Notes (Signed)
 Pt bib ems from work with reports of weakness, fatigue, nausea and hyperglycemia since yesterday. Has not had any episodes of emesis.  CBG 312, no hx DM.  500cc NS and 4mg  Zofran  given pta.

## 2024-09-07 NOTE — ED Provider Triage Note (Signed)
 Emergency Medicine Provider Triage Evaluation Note  Shelly Silva , a 44 y.o. female  was evaluated in triage.  Pt complains of generalized weakness with dyspnea on exertion for the past month and a half.  No chest pain or abdominal pain.  Has had some nausea without vomiting.  No cough or URI symptoms.  No cardiac history or respiratory issues.  No history of PE or any risk factors.  Review of Systems  Positive:  Negative:   Physical Exam  BP (!) 126/90   Pulse 80   Temp 98.2 F (36.8 C)   Resp 16   LMP  (LMP Unknown) Comment: continous bleeding  SpO2 100%  Gen:   Awake, no distress   Resp:  Normal effort  MSK:   Moves extremities without difficulty  Other:  Lung sounds clear  Medical Decision Making  Medically screening exam initiated at 4:03 PM.  Appropriate orders placed.  Shelly Silva was informed that the remainder of the evaluation will be completed by another provider, this initial triage assessment does not replace that evaluation, and the importance of remaining in the ED until their evaluation is complete.  Workup initiated   Shelly Silva 09/07/24 1605

## 2024-09-07 NOTE — Discharge Instructions (Addendum)
 Take your metformin  as prescribed.  Is important that you call and make an appointment with the primary care doctor for further monitoring of your diabetes.  Drink lots of fluids and eat a balanced diet.  Return to the ER for new or worsening symptoms.

## 2024-09-07 NOTE — ED Provider Notes (Signed)
 " [place] [facility] AT [facility] Provider Note   CSN: [id number] Arrival date & time: 09/07/24  1512     Patient presents with: No chief complaint on file.   Shelly Silva is a 44 y.o. female.   44 year old female presents for evaluation of syncopal episode.  States she gets lightheaded when she gets angry.  She states she walked into her bosses office today, felt lightheaded and fainted.  She was found to have hypoglycemia by EMS.  They gave her IV fluids and Zofran .  Patient states she otherwise feels improved.  Denies any other symptoms or concerns at this time.  States she has no history of diabetes.        Prior to Admission medications  Medication Sig Start Date End Date Taking? Authorizing Provider  metFORMIN  (GLUCOPHAGE ) 500 MG tablet Take 1 tablet (500 mg total) by mouth 2 (two) times daily with a meal. 09/07/24 10/07/24 Yes Kelechi Astarita L, DO  aspirin -acetaminophen -caffeine  (EXCEDRIN  MIGRAINE) 250-250-65 MG tablet Take 1 tablet by mouth every 6 (six) hours as needed for headache. 12/04/17   Merilee Harlene NOVAK, PA-C  doxycycline  (VIBRAMYCIN ) 100 MG capsule Take 1 capsule (100 mg total) by mouth 2 (two) times daily. 09/17/23   Piontek, Rocky, MD  HYDROcodone -acetaminophen  (NORCO/VICODIN) 5-325 MG tablet Take 2 tablets by mouth every 4 (four) hours as needed. 09/17/23   Piontek, Rocky, MD  ibuprofen  (ADVIL ) 600 MG tablet Take 1 tablet (600 mg total) by mouth every 6 (six) hours as needed for moderate pain or cramping (mild pain). 11/15/19   Van Knee, MD  ibuprofen  (ADVIL ) 600 MG tablet Take 1 tablet (600 mg total) by mouth every 6 (six) hours as needed for up to 30 doses for headache, mild pain or moderate pain. 12/07/20   Cottie Donnice PARAS, MD    Allergies: Patient has no known allergies.    Review of Systems  Constitutional:  Positive for fatigue. Negative for chills and fever.  HENT:  Negative for ear pain and sore throat.   Eyes:  Negative  for pain and visual disturbance.  Respiratory:  Negative for cough and shortness of breath.   Cardiovascular:  Negative for chest pain and palpitations.  Gastrointestinal:  Negative for abdominal pain and vomiting.  Genitourinary:  Negative for dysuria and hematuria.  Musculoskeletal:  Negative for arthralgias and back pain.  Skin:  Negative for color change and rash.  Neurological:  Positive for light-headedness. Negative for seizures and syncope.  All other systems reviewed and are negative.   Updated Vital Signs BP 124/86 (BP Location: Right Arm)   Pulse 70   Temp 98.4 F (36.9 C) (Oral)   Resp 18   LMP  (LMP Unknown) Comment: continous bleeding  SpO2 99%   Physical Exam Vitals and nursing note reviewed.  Constitutional:      General: She is not in acute distress.    Appearance: Normal appearance. She is well-developed. She is not ill-appearing.  HENT:     Head: Normocephalic and atraumatic.  Eyes:     Conjunctiva/sclera: Conjunctivae normal.  Cardiovascular:     Rate and Rhythm: Normal rate and regular rhythm.     Heart sounds: No murmur heard. Pulmonary:     Effort: Pulmonary effort is normal. No respiratory distress.     Breath sounds: Normal breath sounds.  Abdominal:     Palpations: Abdomen is soft.     Tenderness: There is no abdominal tenderness.  Musculoskeletal:  General: No swelling.     Cervical back: Neck supple.  Skin:    General: Skin is warm and dry.     Capillary Refill: Capillary refill takes less than 2 seconds.  Neurological:     General: No focal deficit present.     Mental Status: She is alert.  Psychiatric:        Mood and Affect: Mood normal.     (all labs ordered are listed, but only abnormal results are displayed) Labs Reviewed  COMPREHENSIVE METABOLIC PANEL WITH GFR - Abnormal; Notable for the following components:      Result Value   Potassium 3.1 (*)    CO2 20 (*)    Glucose, Bld 215 (*)    All other components within  normal limits  CBC - Abnormal; Notable for the following components:   HCT 35.9 (*)    All other components within normal limits  LIPASE, BLOOD  HCG, SERUM, QUALITATIVE  URINALYSIS, ROUTINE W REFLEX MICROSCOPIC  HEMOGLOBIN A1C  CBG MONITORING, ED    EKG: EKG Interpretation Date/Time:  Wednesday September 07 2024 15:36:57 EST Ventricular Rate:  86 PR Interval:  164 QRS Duration:  80 QT Interval:  388 QTC Calculation: 465 R Axis:   40  Text Interpretation: Sinus rhythm Anterior infarct, old Compared with prior EKG from5/07/2005 Confirmed by Gennaro Bouchard ([id number]) on 09/07/2024 9:40:34 PM  Radiology: [facility] Chest 2 View Result Date: 09/07/2024 EXAM: 2 VIEW(S) XRAY OF THE CHEST 09/07/2024 04:25:36 PM COMPARISON: Chest x-ray dated 10/31/2004. CLINICAL HISTORY: SOB. FINDINGS: LUNGS AND PLEURA: No focal pulmonary opacity. No pleural effusion. No pneumothorax. HEART AND MEDIASTINUM: No acute abnormality of the cardiac and mediastinal silhouettes. BONES AND SOFT TISSUES: No acute osseous abnormality. IMPRESSION: 1. No acute process. Electronically signed by: Greig Pique MD MD 09/07/2024 04:32 PM EST RP Workstation: [id number]     Procedures   Medications Ordered in the ED  metFORMIN  (GLUCOPHAGE ) tablet 500 mg (has no administration in time range)  potassium chloride  SA (KLOR-CON  M) CR tablet 40 mEq (has no administration in time range)                                    Medical Decision Making Social determinants of health: No follow up, no primary care   Patient with stable vitals and feeling better here.  Got fluids and Zofran  from EMS.  EKG and lab work fairly unremarkable except for some mild hypokalemia and hyperglycemia.  It has not improved from the time EMS saw her.  She has no history of diabetes and no primary care doctor.  Will give her metformin  here and advised close follow-up with primary care and to establish care in the area.  Will give her prescription for metformin .   Advised to increase her fluid intake and counseled on dietary changes.  Advised to return for any new or worsening symptoms.  She feels comfortable being discharged home.  Problems Addressed: Hypokalemia: acute illness or injury Syncope, unspecified syncope type: acute illness or injury Type 2 diabetes mellitus without complication, without long-term current use of insulin (HCC): acute illness or injury  Amount and/or Complexity of Data Reviewed External Data Reviewed: notes.    Details: Prior ER records reviewed and patient had hyperglycemia at her last visit Labs: ordered. Decision-making details documented in ED Course.    Details: Ordered and reviewed by me and patient with mild hyperglycemia and mild hypokalemia  Risk OTC drugs. Prescription drug management. Drug therapy requiring intensive monitoring for toxicity. Diagnosis or treatment significantly limited by social determinants of health.      Final diagnoses:  Type 2 diabetes mellitus without complication, without long-term current use of insulin (HCC)  Syncope, unspecified syncope type  Hypokalemia    ED Discharge Orders          Ordered    metFORMIN  (GLUCOPHAGE ) 500 MG tablet  2 times daily with meals        09/07/24 2206               Gennaro Bouchard L, DO 09/07/24 2232  "

## 2024-09-08 LAB — HEMOGLOBIN A1C
Hgb A1c MFr Bld: 11.4 % — ABNORMAL HIGH (ref 4.8–5.6)
Mean Plasma Glucose: 280.48 mg/dL

## 2024-09-09 ENCOUNTER — Ambulatory Visit (HOSPITAL_COMMUNITY): Payer: Self-pay
